# Patient Record
Sex: Male | Born: 1955 | Race: White | Hispanic: No | Marital: Married | Smoking: Former smoker
Health system: Southern US, Community
[De-identification: ages and names within clinical notes are randomized; demographics above are authoritative.]

## PROBLEM LIST (undated history)

## (undated) DIAGNOSIS — J449 Chronic obstructive pulmonary disease, unspecified: Secondary | ICD-10-CM

## (undated) DIAGNOSIS — K7210 Chronic hepatic failure without coma: Secondary | ICD-10-CM

## (undated) DIAGNOSIS — F329 Major depressive disorder, single episode, unspecified: Secondary | ICD-10-CM

## (undated) DIAGNOSIS — I5032 Chronic diastolic (congestive) heart failure: Secondary | ICD-10-CM

## (undated) DIAGNOSIS — F1910 Other psychoactive substance abuse, uncomplicated: Secondary | ICD-10-CM

## (undated) DIAGNOSIS — E785 Hyperlipidemia, unspecified: Secondary | ICD-10-CM

## (undated) DIAGNOSIS — J189 Pneumonia, unspecified organism: Secondary | ICD-10-CM

## (undated) DIAGNOSIS — E119 Type 2 diabetes mellitus without complications: Secondary | ICD-10-CM

## (undated) DIAGNOSIS — D638 Anemia in other chronic diseases classified elsewhere: Secondary | ICD-10-CM

## (undated) DIAGNOSIS — F32A Depression, unspecified: Secondary | ICD-10-CM

## (undated) DIAGNOSIS — G4730 Sleep apnea, unspecified: Secondary | ICD-10-CM

## (undated) DIAGNOSIS — T7840XA Allergy, unspecified, initial encounter: Secondary | ICD-10-CM

## (undated) DIAGNOSIS — I10 Essential (primary) hypertension: Secondary | ICD-10-CM

## (undated) DIAGNOSIS — M6282 Rhabdomyolysis: Secondary | ICD-10-CM

## (undated) DIAGNOSIS — N184 Chronic kidney disease, stage 4 (severe): Secondary | ICD-10-CM

## (undated) HISTORY — DX: Major depressive disorder, single episode, unspecified: F32.9

## (undated) HISTORY — DX: Morbid (severe) obesity due to excess calories: E66.01

## (undated) HISTORY — PX: TONSILLECTOMY: SHX5217

## (undated) HISTORY — PX: FOOT SURGERY: SHX648

## (undated) HISTORY — PX: CARDIAC CATHETERIZATION: SHX172

## (undated) HISTORY — DX: Allergy, unspecified, initial encounter: T78.40XA

## (undated) HISTORY — DX: Hyperlipidemia, unspecified: E78.5

## (undated) HISTORY — DX: Chronic obstructive pulmonary disease, unspecified: J44.9

## (undated) HISTORY — PX: TOE AMPUTATION: SHX809

## (undated) HISTORY — DX: Depression, unspecified: F32.A

## (undated) HISTORY — DX: Sleep apnea, unspecified: G47.30

## (undated) HISTORY — DX: Rhabdomyolysis: M62.82

## (undated) HISTORY — DX: Essential (primary) hypertension: I10

---

## 1987-11-09 HISTORY — PX: ABDOMINAL HERNIA REPAIR: SHX539

## 2005-03-17 ENCOUNTER — Ambulatory Visit: Payer: Self-pay | Admitting: Internal Medicine

## 2008-08-10 ENCOUNTER — Emergency Department: Payer: Self-pay | Admitting: Emergency Medicine

## 2011-04-26 ENCOUNTER — Inpatient Hospital Stay: Payer: Self-pay | Admitting: Internal Medicine

## 2011-08-13 ENCOUNTER — Emergency Department: Payer: Self-pay | Admitting: Internal Medicine

## 2011-09-06 ENCOUNTER — Emergency Department: Payer: Self-pay | Admitting: Emergency Medicine

## 2012-01-24 ENCOUNTER — Other Ambulatory Visit: Payer: Self-pay | Admitting: Internal Medicine

## 2012-01-24 NOTE — Telephone Encounter (Signed)
Opened in error

## 2012-07-04 ENCOUNTER — Ambulatory Visit: Payer: Self-pay | Admitting: Family

## 2012-07-12 ENCOUNTER — Ambulatory Visit: Payer: Self-pay | Admitting: Family

## 2012-09-19 ENCOUNTER — Ambulatory Visit: Payer: Self-pay | Admitting: Family

## 2012-10-10 ENCOUNTER — Ambulatory Visit: Payer: Self-pay | Admitting: Family

## 2012-10-25 ENCOUNTER — Ambulatory Visit: Payer: Self-pay | Admitting: Family

## 2012-11-08 ENCOUNTER — Ambulatory Visit: Payer: Self-pay | Admitting: Family

## 2012-12-09 ENCOUNTER — Ambulatory Visit: Payer: Self-pay | Admitting: Family

## 2013-03-04 LAB — COMPREHENSIVE METABOLIC PANEL
Bilirubin,Total: 0.6 mg/dL (ref 0.2–1.0)
Co2: 30 mmol/L (ref 21–32)
EGFR (African American): 29 — ABNORMAL LOW
EGFR (Non-African Amer.): 25 — ABNORMAL LOW
Glucose: 279 mg/dL — ABNORMAL HIGH (ref 65–99)
Potassium: 4.2 mmol/L (ref 3.5–5.1)
SGOT(AST): 19 U/L (ref 15–37)
SGPT (ALT): 20 U/L (ref 12–78)
Sodium: 134 mmol/L — ABNORMAL LOW (ref 136–145)
Total Protein: 7.3 g/dL (ref 6.4–8.2)

## 2013-03-04 LAB — CBC WITH DIFFERENTIAL/PLATELET
Basophil #: 0.1 10*3/uL (ref 0.0–0.1)
Eosinophil #: 0.2 10*3/uL (ref 0.0–0.7)
Eosinophil %: 1.4 %
HCT: 30.4 % — ABNORMAL LOW (ref 40.0–52.0)
Lymphocyte #: 1.5 10*3/uL (ref 1.0–3.6)
Lymphocyte %: 10.9 %
MCH: 29.3 pg (ref 26.0–34.0)
MCHC: 33.7 g/dL (ref 32.0–36.0)
MCV: 87 fL (ref 80–100)
Monocyte #: 1.3 x10 3/mm — ABNORMAL HIGH (ref 0.2–1.0)
Neutrophil #: 11 10*3/uL — ABNORMAL HIGH (ref 1.4–6.5)
Neutrophil %: 77.9 %
Platelet: 294 10*3/uL (ref 150–440)
RDW: 15.6 % — ABNORMAL HIGH (ref 11.5–14.5)

## 2013-03-05 ENCOUNTER — Inpatient Hospital Stay: Payer: Self-pay | Admitting: Internal Medicine

## 2013-03-05 LAB — MAGNESIUM: Magnesium: 1.6 mg/dL — ABNORMAL LOW

## 2013-03-05 LAB — CBC WITH DIFFERENTIAL/PLATELET
Basophil #: 0.1 10*3/uL (ref 0.0–0.1)
Basophil %: 0.4 %
Eosinophil #: 0.1 10*3/uL (ref 0.0–0.7)
Eosinophil %: 1.1 %
HCT: 28.4 % — ABNORMAL LOW (ref 40.0–52.0)
HGB: 9.8 g/dL — ABNORMAL LOW (ref 13.0–18.0)
Lymphocyte %: 10.8 %
MCH: 29.3 pg (ref 26.0–34.0)
Monocyte #: 1.2 x10 3/mm — ABNORMAL HIGH (ref 0.2–1.0)
Monocyte %: 9.3 %
Neutrophil %: 78.4 %
Platelet: 293 10*3/uL (ref 150–440)
RBC: 3.35 10*6/uL — ABNORMAL LOW (ref 4.40–5.90)

## 2013-03-05 LAB — BASIC METABOLIC PANEL
Anion Gap: 6 — ABNORMAL LOW (ref 7–16)
BUN: 33 mg/dL — ABNORMAL HIGH (ref 7–18)
EGFR (African American): 33 — ABNORMAL LOW
EGFR (Non-African Amer.): 28 — ABNORMAL LOW
Glucose: 219 mg/dL — ABNORMAL HIGH (ref 65–99)
Osmolality: 280 (ref 275–301)
Sodium: 133 mmol/L — ABNORMAL LOW (ref 136–145)

## 2013-03-05 LAB — IRON: Iron: 25 ug/dL — ABNORMAL LOW (ref 65–175)

## 2013-03-05 LAB — CREATININE, URINE, RANDOM: Creatinine, Urine Random: 103.9 mg/dL (ref 30.0–125.0)

## 2013-03-05 LAB — HEMOGLOBIN A1C: Hemoglobin A1C: 8.3 % — ABNORMAL HIGH (ref 4.2–6.3)

## 2013-03-05 LAB — PROTEIN, URINE, RANDOM: Protein, Random Urine: 612 mg/dL — ABNORMAL HIGH (ref 0–12)

## 2013-03-05 LAB — FOLATE: Folic Acid: 24.9 ng/mL (ref 3.1–100.0)

## 2013-03-06 LAB — CBC WITH DIFFERENTIAL/PLATELET
HCT: 26.4 % — ABNORMAL LOW (ref 40.0–52.0)
HGB: 9.3 g/dL — ABNORMAL LOW (ref 13.0–18.0)
Lymphocyte %: 18.9 %
MCHC: 35 g/dL (ref 32.0–36.0)
MCV: 85 fL (ref 80–100)
Monocyte %: 11.4 %
Neutrophil #: 6.4 10*3/uL (ref 1.4–6.5)
Neutrophil %: 67.7 %
Platelet: 300 10*3/uL (ref 150–440)
RBC: 3.11 10*6/uL — ABNORMAL LOW (ref 4.40–5.90)
RDW: 15.3 % — ABNORMAL HIGH (ref 11.5–14.5)
WBC: 9.5 10*3/uL (ref 3.8–10.6)

## 2013-03-06 LAB — BASIC METABOLIC PANEL
Anion Gap: 5 — ABNORMAL LOW (ref 7–16)
BUN: 26 mg/dL — ABNORMAL HIGH (ref 7–18)
Co2: 30 mmol/L (ref 21–32)
Creatinine: 2.38 mg/dL — ABNORMAL HIGH (ref 0.60–1.30)
EGFR (African American): 34 — ABNORMAL LOW
Glucose: 104 mg/dL — ABNORMAL HIGH (ref 65–99)
Osmolality: 277 (ref 275–301)
Potassium: 3.9 mmol/L (ref 3.5–5.1)
Sodium: 136 mmol/L (ref 136–145)

## 2013-03-07 LAB — CBC WITH DIFFERENTIAL/PLATELET
Basophil #: 0.1 10*3/uL (ref 0.0–0.1)
HGB: 9.4 g/dL — ABNORMAL LOW (ref 13.0–18.0)
Lymphocyte #: 2 10*3/uL (ref 1.0–3.6)
Lymphocyte %: 19 %
MCH: 28.8 pg (ref 26.0–34.0)
MCHC: 34.1 g/dL (ref 32.0–36.0)
MCV: 85 fL (ref 80–100)
Monocyte #: 1.3 x10 3/mm — ABNORMAL HIGH (ref 0.2–1.0)
Monocyte %: 12.7 %
Neutrophil #: 6.9 10*3/uL — ABNORMAL HIGH (ref 1.4–6.5)
Neutrophil %: 65.9 %
RBC: 3.26 10*6/uL — ABNORMAL LOW (ref 4.40–5.90)

## 2013-03-07 LAB — BASIC METABOLIC PANEL
Anion Gap: 6 — ABNORMAL LOW (ref 7–16)
BUN: 31 mg/dL — ABNORMAL HIGH (ref 7–18)
Calcium, Total: 9 mg/dL (ref 8.5–10.1)
Chloride: 101 mmol/L (ref 98–107)
EGFR (Non-African Amer.): 29 — ABNORMAL LOW
Glucose: 173 mg/dL — ABNORMAL HIGH (ref 65–99)
Potassium: 4 mmol/L (ref 3.5–5.1)

## 2013-03-08 LAB — BASIC METABOLIC PANEL
BUN: 31 mg/dL — ABNORMAL HIGH (ref 7–18)
Chloride: 104 mmol/L (ref 98–107)
Co2: 30 mmol/L (ref 21–32)
Creatinine: 2.15 mg/dL — ABNORMAL HIGH (ref 0.60–1.30)
EGFR (Non-African Amer.): 33 — ABNORMAL LOW
Osmolality: 286 (ref 275–301)
Potassium: 3.9 mmol/L (ref 3.5–5.1)
Sodium: 138 mmol/L (ref 136–145)

## 2013-03-08 LAB — URINALYSIS, COMPLETE
Bacteria: NONE SEEN
Glucose,UR: >=500 mg/dL (ref 0–75)
Leukocyte Esterase: NEGATIVE
Nitrite: NEGATIVE
Ph: 7 (ref 4.5–8.0)
RBC,UR: 2 /HPF (ref 0–5)
Squamous Epithelial: NONE SEEN
WBC UR: <1 /HPF (ref 0–5)

## 2013-03-08 LAB — VANCOMYCIN, TROUGH: Vancomycin, Trough: 13 ug/mL (ref 10–20)

## 2013-03-09 LAB — BASIC METABOLIC PANEL
Anion Gap: 4 — ABNORMAL LOW (ref 7–16)
Calcium, Total: 9.4 mg/dL (ref 8.5–10.1)
Chloride: 103 mmol/L (ref 98–107)
Co2: 30 mmol/L (ref 21–32)
Creatinine: 2.15 mg/dL — ABNORMAL HIGH (ref 0.60–1.30)
EGFR (Non-African Amer.): 33 — ABNORMAL LOW
Glucose: 167 mg/dL — ABNORMAL HIGH (ref 65–99)
Osmolality: 284 (ref 275–301)
Potassium: 3.9 mmol/L (ref 3.5–5.1)

## 2013-03-09 LAB — PROTEIN ELECTROPHORESIS(ARMC)

## 2013-03-10 LAB — CULTURE, BLOOD (SINGLE)

## 2013-03-10 LAB — WOUND CULTURE

## 2013-03-13 LAB — PATHOLOGY REPORT

## 2013-04-13 ENCOUNTER — Ambulatory Visit: Payer: Self-pay | Admitting: Podiatry

## 2013-05-01 ENCOUNTER — Other Ambulatory Visit: Payer: Self-pay | Admitting: Podiatry

## 2013-05-30 ENCOUNTER — Ambulatory Visit: Payer: Self-pay | Admitting: Gastroenterology

## 2013-07-03 ENCOUNTER — Ambulatory Visit: Payer: Self-pay | Admitting: Podiatry

## 2013-07-03 DIAGNOSIS — E119 Type 2 diabetes mellitus without complications: Secondary | ICD-10-CM

## 2013-07-03 LAB — BASIC METABOLIC PANEL
Anion Gap: 2 — ABNORMAL LOW (ref 7–16)
BUN: 27 mg/dL — ABNORMAL HIGH (ref 7–18)
Calcium, Total: 10.3 mg/dL — ABNORMAL HIGH (ref 8.5–10.1)
Co2: 33 mmol/L — ABNORMAL HIGH (ref 21–32)
Creatinine: 2.25 mg/dL — ABNORMAL HIGH (ref 0.60–1.30)
EGFR (Non-African Amer.): 31 — ABNORMAL LOW
Glucose: 229 mg/dL — ABNORMAL HIGH (ref 65–99)
Potassium: 3.8 mmol/L (ref 3.5–5.1)

## 2013-07-03 LAB — HEMOGLOBIN: HGB: 12.7 g/dL — ABNORMAL LOW (ref 13.0–18.0)

## 2013-07-06 ENCOUNTER — Ambulatory Visit: Payer: Self-pay | Admitting: Podiatry

## 2013-07-10 LAB — PATHOLOGY REPORT

## 2013-09-25 ENCOUNTER — Ambulatory Visit: Payer: Self-pay | Admitting: Neurosurgery

## 2013-10-09 ENCOUNTER — Ambulatory Visit: Payer: Self-pay | Admitting: Family

## 2013-11-23 ENCOUNTER — Inpatient Hospital Stay: Payer: Self-pay | Admitting: Internal Medicine

## 2013-11-23 LAB — COMPREHENSIVE METABOLIC PANEL
ALT: 194 U/L — AB (ref 12–78)
ANION GAP: 11 (ref 7–16)
Albumin: 2.6 g/dL — ABNORMAL LOW (ref 3.4–5.0)
Alkaline Phosphatase: 102 U/L
BILIRUBIN TOTAL: 0.4 mg/dL (ref 0.2–1.0)
BUN: 101 mg/dL — AB (ref 7–18)
CALCIUM: 8.7 mg/dL (ref 8.5–10.1)
CREATININE: 6.08 mg/dL — AB (ref 0.60–1.30)
Chloride: 97 mmol/L — ABNORMAL LOW (ref 98–107)
Co2: 22 mmol/L (ref 21–32)
EGFR (African American): 11 — ABNORMAL LOW
EGFR (Non-African Amer.): 9 — ABNORMAL LOW
Glucose: 197 mg/dL — ABNORMAL HIGH (ref 65–99)
Osmolality: 298 (ref 275–301)
POTASSIUM: 5.1 mmol/L (ref 3.5–5.1)
SGOT(AST): 1076 U/L — ABNORMAL HIGH (ref 15–37)
Sodium: 130 mmol/L — ABNORMAL LOW (ref 136–145)
TOTAL PROTEIN: 8.2 g/dL (ref 6.4–8.2)

## 2013-11-23 LAB — CK-MB: CK-MB: 27.1 ng/mL — AB (ref 0.5–3.6)

## 2013-11-23 LAB — CBC
HCT: 35.6 % — ABNORMAL LOW (ref 40.0–52.0)
HGB: 12.3 g/dL — AB (ref 13.0–18.0)
MCH: 29.5 pg (ref 26.0–34.0)
MCHC: 34.5 g/dL (ref 32.0–36.0)
MCV: 86 fL (ref 80–100)
PLATELETS: 341 10*3/uL (ref 150–440)
RBC: 4.15 10*6/uL — ABNORMAL LOW (ref 4.40–5.90)
RDW: 15.6 % — AB (ref 11.5–14.5)
WBC: 10.7 10*3/uL — ABNORMAL HIGH (ref 3.8–10.6)

## 2013-11-23 LAB — SEDIMENTATION RATE: Erythrocyte Sed Rate: >140 mm/hr — ABNORMAL HIGH (ref 0–20)

## 2013-11-23 LAB — CK TOTAL AND CKMB (NOT AT ARMC)
CK, Total: 62971 U/L — ABNORMAL HIGH (ref 35–232)
CK-MB: 28.3 ng/mL — ABNORMAL HIGH (ref 0.5–3.6)

## 2013-11-23 LAB — TROPONIN I: Troponin-I: 0.42 ng/mL — ABNORMAL HIGH

## 2013-11-23 LAB — CK: CK, Total: 61785 U/L — ABNORMAL HIGH (ref 35–232)

## 2013-11-23 LAB — TSH: Thyroid Stimulating Horm: 2.62 u[IU]/mL

## 2013-11-24 LAB — BASIC METABOLIC PANEL
Anion Gap: 10 (ref 7–16)
Anion Gap: 10 (ref 7–16)
Anion Gap: 11 (ref 7–16)
BUN: 104 mg/dL — AB (ref 7–18)
BUN: 105 mg/dL — ABNORMAL HIGH (ref 7–18)
BUN: 105 mg/dL — AB (ref 7–18)
CALCIUM: 8 mg/dL — AB (ref 8.5–10.1)
CALCIUM: 7.3 mg/dL — AB (ref 8.5–10.1)
CALCIUM: 7.1 mg/dL — AB (ref 8.5–10.1)
CHLORIDE: 100 mmol/L (ref 98–107)
CHLORIDE: 100 mmol/L (ref 98–107)
CO2: 21 mmol/L (ref 21–32)
Chloride: 101 mmol/L (ref 98–107)
Co2: 20 mmol/L — ABNORMAL LOW (ref 21–32)
Co2: 20 mmol/L — ABNORMAL LOW (ref 21–32)
Creatinine: 6.19 mg/dL — ABNORMAL HIGH (ref 0.60–1.30)
Creatinine: 6.63 mg/dL — ABNORMAL HIGH (ref 0.60–1.30)
Creatinine: 6.62 mg/dL — ABNORMAL HIGH (ref 0.60–1.30)
EGFR (African American): 11 — ABNORMAL LOW
EGFR (Non-African Amer.): 9 — ABNORMAL LOW
EGFR (Non-African Amer.): 8 — ABNORMAL LOW
EGFR (Non-African Amer.): 8 — ABNORMAL LOW
GFR CALC AF AMER: 10 — AB
GFR CALC AF AMER: 10 — AB
GLUCOSE: 114 mg/dL — AB (ref 65–99)
GLUCOSE: 117 mg/dL — AB (ref 65–99)
Glucose: 115 mg/dL — ABNORMAL HIGH (ref 65–99)
OSMOLALITY: 296 (ref 275–301)
Osmolality: 295 (ref 275–301)
Osmolality: 298 (ref 275–301)
Potassium: 4.4 mmol/L (ref 3.5–5.1)
Potassium: 4.5 mmol/L (ref 3.5–5.1)
Potassium: 4.6 mmol/L (ref 3.5–5.1)
SODIUM: 131 mmol/L — AB (ref 136–145)
Sodium: 130 mmol/L — ABNORMAL LOW (ref 136–145)
Sodium: 132 mmol/L — ABNORMAL LOW (ref 136–145)

## 2013-11-24 LAB — URINALYSIS, COMPLETE
BACTERIA: NONE SEEN
BILIRUBIN, UR: NEGATIVE
Ketone: NEGATIVE
Leukocyte Esterase: NEGATIVE
NITRITE: NEGATIVE
PH: 5 (ref 4.5–8.0)
Specific Gravity: 1.014 (ref 1.003–1.030)
Squamous Epithelial: NONE SEEN
WBC UR: 5 /HPF (ref 0–5)

## 2013-11-24 LAB — CBC WITH DIFFERENTIAL/PLATELET
BASOS PCT: 1.4 %
Basophil #: 0.1 10*3/uL (ref 0.0–0.1)
EOS PCT: 1.8 %
Eosinophil #: 0.2 10*3/uL (ref 0.0–0.7)
HCT: 32.1 % — AB (ref 40.0–52.0)
HGB: 11.1 g/dL — ABNORMAL LOW (ref 13.0–18.0)
Lymphocyte #: 1.9 10*3/uL (ref 1.0–3.6)
Lymphocyte %: 21.1 %
MCH: 29.4 pg (ref 26.0–34.0)
MCHC: 34.6 g/dL (ref 32.0–36.0)
MCV: 85 fL (ref 80–100)
MONOS PCT: 8.6 %
Monocyte #: 0.8 x10 3/mm (ref 0.2–1.0)
NEUTROS ABS: 5.9 10*3/uL (ref 1.4–6.5)
NEUTROS PCT: 67.1 %
Platelet: 272 10*3/uL (ref 150–440)
RBC: 3.76 10*6/uL — ABNORMAL LOW (ref 4.40–5.90)
RDW: 15.9 % — AB (ref 11.5–14.5)
WBC: 8.8 10*3/uL (ref 3.8–10.6)

## 2013-11-24 LAB — CK: CK, Total: 28420 U/L — ABNORMAL HIGH (ref 35–232)

## 2013-11-24 LAB — CK-MB
CK-MB: 26.2 ng/mL — ABNORMAL HIGH (ref 0.5–3.6)
CK-MB: 21.9 ng/mL — ABNORMAL HIGH (ref 0.5–3.6)
CK-MB: 18.9 ng/mL — AB (ref 0.5–3.6)
CK-MB: 16.5 ng/mL — ABNORMAL HIGH (ref 0.5–3.6)
CK-MB: 13.1 ng/mL — AB (ref 0.5–3.6)

## 2013-11-24 LAB — DRUG SCREEN, URINE
Amphetamines, Ur Screen: NEGATIVE (ref ?–1000)
BENZODIAZEPINE, UR SCRN: NEGATIVE (ref ?–200)
Barbiturates, Ur Screen: NEGATIVE (ref ?–200)
Cannabinoid 50 Ng, Ur ~~LOC~~: NEGATIVE (ref ?–50)
Cocaine Metabolite,Ur ~~LOC~~: NEGATIVE (ref ?–300)
MDMA (Ecstasy)Ur Screen: NEGATIVE (ref ?–500)
Methadone, Ur Screen: NEGATIVE (ref ?–300)
OPIATE, UR SCREEN: POSITIVE (ref ?–300)
Phencyclidine (PCP) Ur S: NEGATIVE (ref ?–25)
TRICYCLIC, UR SCREEN: POSITIVE (ref ?–1000)

## 2013-11-25 LAB — CK: CK, Total: 19885 U/L — ABNORMAL HIGH (ref 35–232)

## 2013-11-25 LAB — HEMOGLOBIN: HGB: 10.8 g/dL — AB (ref 13.0–18.0)

## 2013-11-25 LAB — BASIC METABOLIC PANEL
Anion Gap: 10 (ref 7–16)
BUN: 114 mg/dL — AB (ref 7–18)
CALCIUM: 7.4 mg/dL — AB (ref 8.5–10.1)
Chloride: 102 mmol/L (ref 98–107)
Co2: 18 mmol/L — ABNORMAL LOW (ref 21–32)
Creatinine: 6.96 mg/dL — ABNORMAL HIGH (ref 0.60–1.30)
EGFR (Non-African Amer.): 8 — ABNORMAL LOW
GFR CALC AF AMER: 9 — AB
Glucose: 99 mg/dL (ref 65–99)
Osmolality: 297 (ref 275–301)
Potassium: 4.4 mmol/L (ref 3.5–5.1)
Sodium: 130 mmol/L — ABNORMAL LOW (ref 136–145)

## 2013-11-26 LAB — BASIC METABOLIC PANEL
ANION GAP: 11 (ref 7–16)
BUN: 113 mg/dL — ABNORMAL HIGH (ref 7–18)
CALCIUM: 7.3 mg/dL — AB (ref 8.5–10.1)
CHLORIDE: 99 mmol/L (ref 98–107)
CO2: 19 mmol/L — AB (ref 21–32)
Creatinine: 7.57 mg/dL — ABNORMAL HIGH (ref 0.60–1.30)
EGFR (Non-African Amer.): 7 — ABNORMAL LOW
GFR CALC AF AMER: 8 — AB
GLUCOSE: 139 mg/dL — AB (ref 65–99)
Osmolality: 297 (ref 275–301)
Potassium: 4 mmol/L (ref 3.5–5.1)
SODIUM: 129 mmol/L — AB (ref 136–145)

## 2013-11-26 LAB — CK: CK, TOTAL: 10229 U/L — AB (ref 35–232)

## 2013-11-27 LAB — CK: CK, Total: 5964 U/L — ABNORMAL HIGH (ref 35–232)

## 2013-11-27 LAB — BASIC METABOLIC PANEL
Anion Gap: 13 (ref 7–16)
BUN: 113 mg/dL — AB (ref 7–18)
CO2: 22 mmol/L (ref 21–32)
CREATININE: 7.73 mg/dL — AB (ref 0.60–1.30)
Calcium, Total: 7.5 mg/dL — ABNORMAL LOW (ref 8.5–10.1)
Chloride: 95 mmol/L — ABNORMAL LOW (ref 98–107)
EGFR (African American): 8 — ABNORMAL LOW
EGFR (Non-African Amer.): 7 — ABNORMAL LOW
Glucose: 185 mg/dL — ABNORMAL HIGH (ref 65–99)
OSMOLALITY: 301 (ref 275–301)
Potassium: 3.9 mmol/L (ref 3.5–5.1)
Sodium: 130 mmol/L — ABNORMAL LOW (ref 136–145)

## 2013-11-27 LAB — PLATELET COUNT: PLATELETS: 274 10*3/uL (ref 150–440)

## 2013-11-27 LAB — HEMOGLOBIN: HGB: 10.6 g/dL — ABNORMAL LOW (ref 13.0–18.0)

## 2013-11-28 LAB — COMPREHENSIVE METABOLIC PANEL
ANION GAP: 11 (ref 7–16)
Albumin: 2.1 g/dL — ABNORMAL LOW (ref 3.4–5.0)
Alkaline Phosphatase: 80 U/L
BUN: 106 mg/dL — AB (ref 7–18)
Bilirubin,Total: 0.3 mg/dL (ref 0.2–1.0)
CALCIUM: 7.7 mg/dL — AB (ref 8.5–10.1)
CO2: 25 mmol/L (ref 21–32)
Chloride: 94 mmol/L — ABNORMAL LOW (ref 98–107)
Creatinine: 7.27 mg/dL — ABNORMAL HIGH (ref 0.60–1.30)
EGFR (Non-African Amer.): 8 — ABNORMAL LOW
GFR CALC AF AMER: 9 — AB
Glucose: 171 mg/dL — ABNORMAL HIGH (ref 65–99)
Osmolality: 298 (ref 275–301)
Potassium: 3.6 mmol/L (ref 3.5–5.1)
SGOT(AST): 79 U/L — ABNORMAL HIGH (ref 15–37)
SGPT (ALT): 72 U/L (ref 12–78)
Sodium: 130 mmol/L — ABNORMAL LOW (ref 136–145)
Total Protein: 6.5 g/dL (ref 6.4–8.2)

## 2013-11-28 LAB — CK: CK, Total: 3164 U/L — ABNORMAL HIGH (ref 35–232)

## 2013-11-29 LAB — CBC WITH DIFFERENTIAL/PLATELET
Basophil #: 0.1 10*3/uL (ref 0.0–0.1)
Basophil %: 0.7 %
EOS ABS: 0.3 10*3/uL (ref 0.0–0.7)
Eosinophil %: 3.5 %
HCT: 27.3 % — AB (ref 40.0–52.0)
HGB: 9.5 g/dL — AB (ref 13.0–18.0)
Lymphocyte #: 1.6 10*3/uL (ref 1.0–3.6)
Lymphocyte %: 21.5 %
MCH: 29.9 pg (ref 26.0–34.0)
MCHC: 34.7 g/dL (ref 32.0–36.0)
MCV: 86 fL (ref 80–100)
MONOS PCT: 11.5 %
Monocyte #: 0.9 x10 3/mm (ref 0.2–1.0)
Neutrophil #: 4.7 10*3/uL (ref 1.4–6.5)
Neutrophil %: 62.8 %
Platelet: 270 10*3/uL (ref 150–440)
RBC: 3.17 10*6/uL — ABNORMAL LOW (ref 4.40–5.90)
RDW: 16.1 % — ABNORMAL HIGH (ref 11.5–14.5)
WBC: 7.6 10*3/uL (ref 3.8–10.6)

## 2013-11-29 LAB — PROTEIN / CREATININE RATIO, URINE
Creatinine, Urine: 32.6 mg/dL (ref 30.0–125.0)
PROTEIN/CREAT. RATIO: 5613 mg/g{creat} — AB (ref 0–200)
Protein, Random Urine: 183 mg/dL — ABNORMAL HIGH (ref 0–12)

## 2013-11-29 LAB — BASIC METABOLIC PANEL
Anion Gap: 10 (ref 7–16)
BUN: 96 mg/dL — ABNORMAL HIGH (ref 7–18)
CO2: 28 mmol/L (ref 21–32)
CREATININE: 7.2 mg/dL — AB (ref 0.60–1.30)
Calcium, Total: 7.9 mg/dL — ABNORMAL LOW (ref 8.5–10.1)
Chloride: 94 mmol/L — ABNORMAL LOW (ref 98–107)
EGFR (African American): 9 — ABNORMAL LOW
EGFR (Non-African Amer.): 8 — ABNORMAL LOW
Glucose: 174 mg/dL — ABNORMAL HIGH (ref 65–99)
OSMOLALITY: 298 (ref 275–301)
Potassium: 3.3 mmol/L — ABNORMAL LOW (ref 3.5–5.1)
Sodium: 132 mmol/L — ABNORMAL LOW (ref 136–145)

## 2013-11-29 LAB — CK: CK, Total: 2473 U/L — ABNORMAL HIGH (ref 35–232)

## 2013-11-30 LAB — BASIC METABOLIC PANEL
Anion Gap: 7 (ref 7–16)
BUN: 86 mg/dL — ABNORMAL HIGH (ref 7–18)
CHLORIDE: 92 mmol/L — AB (ref 98–107)
Calcium, Total: 8.1 mg/dL — ABNORMAL LOW (ref 8.5–10.1)
Co2: 33 mmol/L — ABNORMAL HIGH (ref 21–32)
Creatinine: 6.6 mg/dL — ABNORMAL HIGH (ref 0.60–1.30)
EGFR (African American): 10 — ABNORMAL LOW
EGFR (Non-African Amer.): 8 — ABNORMAL LOW
GLUCOSE: 205 mg/dL — AB (ref 65–99)
Osmolality: 297 (ref 275–301)
Potassium: 3.4 mmol/L — ABNORMAL LOW (ref 3.5–5.1)
Sodium: 132 mmol/L — ABNORMAL LOW (ref 136–145)

## 2013-11-30 LAB — PROTEIN ELECTROPHORESIS(ARMC)

## 2013-11-30 LAB — UR PROT ELECTROPHORESIS, URINE RANDOM

## 2013-12-01 LAB — BASIC METABOLIC PANEL
ANION GAP: 6 — AB (ref 7–16)
BUN: 74 mg/dL — AB (ref 7–18)
CO2: 37 mmol/L — AB (ref 21–32)
Calcium, Total: 8.6 mg/dL (ref 8.5–10.1)
Chloride: 93 mmol/L — ABNORMAL LOW (ref 98–107)
Creatinine: 6.28 mg/dL — ABNORMAL HIGH (ref 0.60–1.30)
EGFR (African American): 10 — ABNORMAL LOW
EGFR (Non-African Amer.): 9 — ABNORMAL LOW
GLUCOSE: 113 mg/dL — AB (ref 65–99)
Osmolality: 295 (ref 275–301)
Potassium: 3.3 mmol/L — ABNORMAL LOW (ref 3.5–5.1)
SODIUM: 136 mmol/L (ref 136–145)

## 2013-12-02 LAB — BASIC METABOLIC PANEL
Anion Gap: 3 — ABNORMAL LOW (ref 7–16)
BUN: 66 mg/dL — ABNORMAL HIGH (ref 7–18)
CHLORIDE: 87 mmol/L — AB (ref 98–107)
CO2: 42 mmol/L — AB (ref 21–32)
Calcium, Total: 8.9 mg/dL (ref 8.5–10.1)
Creatinine: 5.85 mg/dL — ABNORMAL HIGH (ref 0.60–1.30)
EGFR (African American): 11 — ABNORMAL LOW
EGFR (Non-African Amer.): 10 — ABNORMAL LOW
GLUCOSE: 241 mg/dL — AB (ref 65–99)
Osmolality: 291 (ref 275–301)
POTASSIUM: 3.3 mmol/L — AB (ref 3.5–5.1)
SODIUM: 132 mmol/L — AB (ref 136–145)

## 2013-12-03 LAB — BASIC METABOLIC PANEL
ANION GAP: 7 (ref 7–16)
BUN: 64 mg/dL — ABNORMAL HIGH (ref 7–18)
CHLORIDE: 92 mmol/L — AB (ref 98–107)
CREATININE: 5.4 mg/dL — AB (ref 0.60–1.30)
Calcium, Total: 9.5 mg/dL (ref 8.5–10.1)
Co2: 37 mmol/L — ABNORMAL HIGH (ref 21–32)
GFR CALC AF AMER: 13 — AB
GFR CALC NON AF AMER: 11 — AB
GLUCOSE: 136 mg/dL — AB (ref 65–99)
Osmolality: 292 (ref 275–301)
Potassium: 3.4 mmol/L — ABNORMAL LOW (ref 3.5–5.1)
SODIUM: 136 mmol/L (ref 136–145)

## 2013-12-03 LAB — PLATELET COUNT: Platelet: 372 10*3/uL (ref 150–440)

## 2013-12-04 ENCOUNTER — Inpatient Hospital Stay: Payer: Self-pay | Admitting: Internal Medicine

## 2013-12-04 LAB — BASIC METABOLIC PANEL
ANION GAP: 6 — AB (ref 7–16)
BUN: 59 mg/dL — ABNORMAL HIGH (ref 7–18)
CALCIUM: 9.1 mg/dL (ref 8.5–10.1)
CHLORIDE: 92 mmol/L — AB (ref 98–107)
Co2: 35 mmol/L — ABNORMAL HIGH (ref 21–32)
Creatinine: 5.22 mg/dL — ABNORMAL HIGH (ref 0.60–1.30)
EGFR (Non-African Amer.): 11 — ABNORMAL LOW
GFR CALC AF AMER: 13 — AB
Glucose: 167 mg/dL — ABNORMAL HIGH (ref 65–99)
Osmolality: 287 (ref 275–301)
Potassium: 3.4 mmol/L — ABNORMAL LOW (ref 3.5–5.1)
Sodium: 133 mmol/L — ABNORMAL LOW (ref 136–145)

## 2013-12-04 LAB — URINALYSIS, COMPLETE
BACTERIA: NONE SEEN
Bilirubin,UR: NEGATIVE
Glucose,UR: >=500 mg/dL (ref 0–75)
KETONE: NEGATIVE
NITRITE: NEGATIVE
PH: 7 (ref 4.5–8.0)
RBC,UR: 15 /HPF (ref 0–5)
Specific Gravity: 1.007 (ref 1.003–1.030)
Squamous Epithelial: <1
WBC UR: 14 /HPF (ref 0–5)

## 2013-12-04 LAB — CBC
HCT: 26.7 % — AB (ref 40.0–52.0)
HGB: 9.2 g/dL — ABNORMAL LOW (ref 13.0–18.0)
MCH: 30.3 pg (ref 26.0–34.0)
MCHC: 34.5 g/dL (ref 32.0–36.0)
MCV: 88 fL (ref 80–100)
Platelet: 352 10*3/uL (ref 150–440)
RBC: 3.05 10*6/uL — ABNORMAL LOW (ref 4.40–5.90)
RDW: 15.3 % — ABNORMAL HIGH (ref 11.5–14.5)
WBC: 8.6 10*3/uL (ref 3.8–10.6)

## 2013-12-04 LAB — MAGNESIUM: MAGNESIUM: 1.6 mg/dL — AB

## 2013-12-04 LAB — CK TOTAL AND CKMB (NOT AT ARMC)
CK, Total: 336 U/L — ABNORMAL HIGH (ref 35–232)
CK-MB: 10.1 ng/mL — AB (ref 0.5–3.6)

## 2013-12-04 LAB — ALBUMIN: Albumin: 2.3 g/dL — ABNORMAL LOW (ref 3.4–5.0)

## 2013-12-04 LAB — PRO B NATRIURETIC PEPTIDE: B-Type Natriuretic Peptide: 1993 pg/mL — ABNORMAL HIGH (ref 0–125)

## 2013-12-04 LAB — TROPONIN I: Troponin-I: <0.02 ng/mL

## 2013-12-05 DIAGNOSIS — I4891 Unspecified atrial fibrillation: Secondary | ICD-10-CM

## 2013-12-05 LAB — BASIC METABOLIC PANEL
Anion Gap: 5 — ABNORMAL LOW (ref 7–16)
BUN: 58 mg/dL — AB (ref 7–18)
CALCIUM: 8.7 mg/dL (ref 8.5–10.1)
Chloride: 95 mmol/L — ABNORMAL LOW (ref 98–107)
Co2: 35 mmol/L — ABNORMAL HIGH (ref 21–32)
Creatinine: 5.14 mg/dL — ABNORMAL HIGH (ref 0.60–1.30)
EGFR (Non-African Amer.): 11 — ABNORMAL LOW
GFR CALC AF AMER: 13 — AB
Glucose: 179 mg/dL — ABNORMAL HIGH (ref 65–99)
Osmolality: 291 (ref 275–301)
POTASSIUM: 3.2 mmol/L — AB (ref 3.5–5.1)
SODIUM: 135 mmol/L — AB (ref 136–145)

## 2013-12-05 LAB — CBC WITH DIFFERENTIAL/PLATELET
BASOS ABS: 0 10*3/uL (ref 0.0–0.1)
Basophil %: 0.4 %
EOS ABS: 0.2 10*3/uL (ref 0.0–0.7)
EOS PCT: 2.9 %
HCT: 25.4 % — AB (ref 40.0–52.0)
HGB: 8.7 g/dL — ABNORMAL LOW (ref 13.0–18.0)
Lymphocyte #: 1.3 10*3/uL (ref 1.0–3.6)
Lymphocyte %: 14.8 %
MCH: 30.1 pg (ref 26.0–34.0)
MCHC: 34.3 g/dL (ref 32.0–36.0)
MCV: 88 fL (ref 80–100)
MONO ABS: 0.7 x10 3/mm (ref 0.2–1.0)
Monocyte %: 8.7 %
Neutrophil #: 6.2 10*3/uL (ref 1.4–6.5)
Neutrophil %: 73.2 %
Platelet: 331 10*3/uL (ref 150–440)
RBC: 2.9 10*6/uL — ABNORMAL LOW (ref 4.40–5.90)
RDW: 15.5 % — ABNORMAL HIGH (ref 11.5–14.5)
WBC: 8.5 10*3/uL (ref 3.8–10.6)

## 2013-12-05 LAB — TROPONIN I: Troponin-I: <0.02 ng/mL

## 2013-12-05 LAB — MAGNESIUM: MAGNESIUM: 1.7 mg/dL — AB

## 2013-12-06 LAB — BASIC METABOLIC PANEL
ANION GAP: 7 (ref 7–16)
BUN: 55 mg/dL — ABNORMAL HIGH (ref 7–18)
CHLORIDE: 96 mmol/L — AB (ref 98–107)
CREATININE: 4.82 mg/dL — AB (ref 0.60–1.30)
Calcium, Total: 8.8 mg/dL (ref 8.5–10.1)
Co2: 34 mmol/L — ABNORMAL HIGH (ref 21–32)
GFR CALC AF AMER: 14 — AB
GFR CALC NON AF AMER: 12 — AB
Glucose: 128 mg/dL — ABNORMAL HIGH (ref 65–99)
Osmolality: 291 (ref 275–301)
Potassium: 3.5 mmol/L (ref 3.5–5.1)
SODIUM: 137 mmol/L (ref 136–145)

## 2013-12-06 LAB — URINE CULTURE

## 2013-12-06 LAB — TROPONIN I: Troponin-I: <0.02 ng/mL

## 2013-12-06 LAB — ALBUMIN: ALBUMIN: 2.3 g/dL — AB (ref 3.4–5.0)

## 2013-12-07 LAB — BASIC METABOLIC PANEL
ANION GAP: 6 — AB (ref 7–16)
BUN: 52 mg/dL — AB (ref 7–18)
Calcium, Total: 9 mg/dL (ref 8.5–10.1)
Chloride: 95 mmol/L — ABNORMAL LOW (ref 98–107)
Co2: 37 mmol/L — ABNORMAL HIGH (ref 21–32)
Creatinine: 4.48 mg/dL — ABNORMAL HIGH (ref 0.60–1.30)
EGFR (African American): 16 — ABNORMAL LOW
GFR CALC NON AF AMER: 14 — AB
Glucose: 86 mg/dL (ref 65–99)
OSMOLALITY: 289 (ref 275–301)
Potassium: 3.6 mmol/L (ref 3.5–5.1)
Sodium: 138 mmol/L (ref 136–145)

## 2013-12-08 LAB — CBC WITH DIFFERENTIAL/PLATELET
Basophil #: 0 10*3/uL (ref 0.0–0.1)
Basophil %: 0.3 %
EOS ABS: 0.1 10*3/uL (ref 0.0–0.7)
EOS PCT: 1.8 %
HCT: 24.1 % — ABNORMAL LOW (ref 40.0–52.0)
HGB: 8 g/dL — AB (ref 13.0–18.0)
LYMPHS PCT: 11.8 %
Lymphocyte #: 0.8 10*3/uL — ABNORMAL LOW (ref 1.0–3.6)
MCH: 29.1 pg (ref 26.0–34.0)
MCHC: 33.4 g/dL (ref 32.0–36.0)
MCV: 87 fL (ref 80–100)
MONOS PCT: 11.9 %
Monocyte #: 0.8 x10 3/mm (ref 0.2–1.0)
Neutrophil #: 5.2 10*3/uL (ref 1.4–6.5)
Neutrophil %: 74.2 %
Platelet: 349 10*3/uL (ref 150–440)
RBC: 2.76 10*6/uL — ABNORMAL LOW (ref 4.40–5.90)
RDW: 15.4 % — AB (ref 11.5–14.5)
WBC: 7.1 10*3/uL (ref 3.8–10.6)

## 2013-12-08 LAB — BASIC METABOLIC PANEL
ANION GAP: 6 — AB (ref 7–16)
BUN: 49 mg/dL — ABNORMAL HIGH (ref 7–18)
CO2: 34 mmol/L — AB (ref 21–32)
CREATININE: 3.83 mg/dL — AB (ref 0.60–1.30)
Calcium, Total: 9.4 mg/dL (ref 8.5–10.1)
Chloride: 95 mmol/L — ABNORMAL LOW (ref 98–107)
EGFR (African American): 19 — ABNORMAL LOW
GFR CALC NON AF AMER: 16 — AB
GLUCOSE: 129 mg/dL — AB (ref 65–99)
Osmolality: 285 (ref 275–301)
POTASSIUM: 3.2 mmol/L — AB (ref 3.5–5.1)
Sodium: 135 mmol/L — ABNORMAL LOW (ref 136–145)

## 2013-12-09 LAB — RENAL FUNCTION PANEL
ALBUMIN: 2.5 g/dL — AB (ref 3.4–5.0)
ANION GAP: 5 — AB (ref 7–16)
BUN: 44 mg/dL — ABNORMAL HIGH (ref 7–18)
CO2: 33 mmol/L — AB (ref 21–32)
CREATININE: 3.37 mg/dL — AB (ref 0.60–1.30)
Calcium, Total: 9.7 mg/dL (ref 8.5–10.1)
Chloride: 97 mmol/L — ABNORMAL LOW (ref 98–107)
EGFR (African American): 22 — ABNORMAL LOW
EGFR (Non-African Amer.): 19 — ABNORMAL LOW
Glucose: 100 mg/dL — ABNORMAL HIGH (ref 65–99)
Osmolality: 281 (ref 275–301)
PHOSPHORUS: 3.3 mg/dL (ref 2.5–4.9)
POTASSIUM: 3.2 mmol/L — AB (ref 3.5–5.1)
Sodium: 135 mmol/L — ABNORMAL LOW (ref 136–145)

## 2013-12-09 LAB — CULTURE, BLOOD (SINGLE)

## 2013-12-10 LAB — BASIC METABOLIC PANEL
Anion Gap: 0 — ABNORMAL LOW (ref 7–16)
BUN: 40 mg/dL — AB (ref 7–18)
CO2: 35 mmol/L — AB (ref 21–32)
CREATININE: 3.07 mg/dL — AB (ref 0.60–1.30)
Calcium, Total: 9.6 mg/dL (ref 8.5–10.1)
Chloride: 100 mmol/L (ref 98–107)
EGFR (African American): 25 — ABNORMAL LOW
EGFR (Non-African Amer.): 21 — ABNORMAL LOW
GLUCOSE: 150 mg/dL — AB (ref 65–99)
OSMOLALITY: 283 (ref 275–301)
POTASSIUM: 3.5 mmol/L (ref 3.5–5.1)
Sodium: 135 mmol/L — ABNORMAL LOW (ref 136–145)

## 2013-12-10 LAB — MAGNESIUM: MAGNESIUM: 1.5 mg/dL — AB

## 2013-12-11 LAB — BASIC METABOLIC PANEL
Anion Gap: 3 — ABNORMAL LOW (ref 7–16)
BUN: 36 mg/dL — ABNORMAL HIGH (ref 7–18)
CALCIUM: 9.6 mg/dL (ref 8.5–10.1)
CO2: 37 mmol/L — AB (ref 21–32)
Chloride: 99 mmol/L (ref 98–107)
Creatinine: 2.85 mg/dL — ABNORMAL HIGH (ref 0.60–1.30)
EGFR (Non-African Amer.): 23 — ABNORMAL LOW
GFR CALC AF AMER: 27 — AB
Glucose: 164 mg/dL — ABNORMAL HIGH (ref 65–99)
OSMOLALITY: 290 (ref 275–301)
Potassium: 3.5 mmol/L (ref 3.5–5.1)
SODIUM: 139 mmol/L (ref 136–145)

## 2013-12-19 ENCOUNTER — Encounter: Payer: Self-pay | Admitting: *Deleted

## 2013-12-25 ENCOUNTER — Encounter: Payer: Medicare HMO | Admitting: Cardiovascular Disease

## 2014-01-22 ENCOUNTER — Inpatient Hospital Stay: Payer: Self-pay | Admitting: Internal Medicine

## 2014-01-22 LAB — COMPREHENSIVE METABOLIC PANEL
ALK PHOS: 83 U/L
Albumin: 2.7 g/dL — ABNORMAL LOW (ref 3.4–5.0)
Anion Gap: 5 — ABNORMAL LOW (ref 7–16)
BUN: 28 mg/dL — ABNORMAL HIGH (ref 7–18)
Bilirubin,Total: 0.6 mg/dL (ref 0.2–1.0)
CO2: 27 mmol/L (ref 21–32)
CREATININE: 1.62 mg/dL — AB (ref 0.60–1.30)
Calcium, Total: 9.2 mg/dL (ref 8.5–10.1)
Chloride: 105 mmol/L (ref 98–107)
EGFR (African American): 54 — ABNORMAL LOW
EGFR (Non-African Amer.): 46 — ABNORMAL LOW
Glucose: 166 mg/dL — ABNORMAL HIGH (ref 65–99)
Osmolality: 283 (ref 275–301)
POTASSIUM: 3.8 mmol/L (ref 3.5–5.1)
SGOT(AST): 172 U/L — ABNORMAL HIGH (ref 15–37)
SGPT (ALT): 53 U/L (ref 12–78)
SODIUM: 137 mmol/L (ref 136–145)
TOTAL PROTEIN: 7.4 g/dL (ref 6.4–8.2)

## 2014-01-22 LAB — CBC
HCT: 35.1 % — AB (ref 40.0–52.0)
HGB: 11.8 g/dL — ABNORMAL LOW (ref 13.0–18.0)
MCH: 27 pg (ref 26.0–34.0)
MCHC: 33.6 g/dL (ref 32.0–36.0)
MCV: 80 fL (ref 80–100)
Platelet: 409 10*3/uL (ref 150–440)
RBC: 4.37 10*6/uL — ABNORMAL LOW (ref 4.40–5.90)
RDW: 16.2 % — AB (ref 11.5–14.5)
WBC: 10.9 10*3/uL — AB (ref 3.8–10.6)

## 2014-01-22 LAB — CK TOTAL AND CKMB (NOT AT ARMC)
CK, TOTAL: 3576 U/L — AB
CK-MB: 26.6 ng/mL — ABNORMAL HIGH (ref 0.5–3.6)

## 2014-01-22 LAB — TROPONIN I: Troponin-I: 0.2 ng/mL — ABNORMAL HIGH

## 2014-01-23 LAB — BASIC METABOLIC PANEL
ANION GAP: 4 — AB (ref 7–16)
BUN: 28 mg/dL — ABNORMAL HIGH (ref 7–18)
CHLORIDE: 106 mmol/L (ref 98–107)
CO2: 28 mmol/L (ref 21–32)
Calcium, Total: 8.8 mg/dL (ref 8.5–10.1)
Creatinine: 1.59 mg/dL — ABNORMAL HIGH (ref 0.60–1.30)
EGFR (African American): 55 — ABNORMAL LOW
EGFR (Non-African Amer.): 47 — ABNORMAL LOW
Glucose: 117 mg/dL — ABNORMAL HIGH (ref 65–99)
Osmolality: 282 (ref 275–301)
POTASSIUM: 3.7 mmol/L (ref 3.5–5.1)
SODIUM: 138 mmol/L (ref 136–145)

## 2014-01-23 LAB — URINALYSIS, COMPLETE
Bilirubin,UR: NEGATIVE
Glucose,UR: 150 mg/dL (ref 0–75)
Hyaline Cast: 2
Ketone: NEGATIVE
LEUKOCYTE ESTERASE: NEGATIVE
Nitrite: NEGATIVE
PH: 5 (ref 4.5–8.0)
RBC,UR: 5 /HPF (ref 0–5)
Specific Gravity: 1.014 (ref 1.003–1.030)
Squamous Epithelial: 1
WBC UR: 2 /HPF (ref 0–5)

## 2014-01-23 LAB — CBC WITH DIFFERENTIAL/PLATELET
Basophil #: 0.1 10*3/uL (ref 0.0–0.1)
Basophil %: 0.6 %
Eosinophil #: 0.2 10*3/uL (ref 0.0–0.7)
Eosinophil %: 1.8 %
HCT: 33.4 % — AB (ref 40.0–52.0)
HGB: 11.2 g/dL — ABNORMAL LOW (ref 13.0–18.0)
LYMPHS ABS: 3 10*3/uL (ref 1.0–3.6)
LYMPHS PCT: 29.8 %
MCH: 27.3 pg (ref 26.0–34.0)
MCHC: 33.4 g/dL (ref 32.0–36.0)
MCV: 82 fL (ref 80–100)
MONO ABS: 0.7 x10 3/mm (ref 0.2–1.0)
Monocyte %: 7.3 %
NEUTROS ABS: 6.2 10*3/uL (ref 1.4–6.5)
Neutrophil %: 60.5 %
Platelet: 385 10*3/uL (ref 150–440)
RBC: 4.08 10*6/uL — AB (ref 4.40–5.90)
RDW: 16.3 % — ABNORMAL HIGH (ref 11.5–14.5)
WBC: 10.2 10*3/uL (ref 3.8–10.6)

## 2014-01-23 LAB — TROPONIN I
Troponin-I: 0.23 ng/mL — ABNORMAL HIGH
Troponin-I: 0.22 ng/mL — ABNORMAL HIGH

## 2014-01-23 LAB — MAGNESIUM: Magnesium: 1.7 mg/dL — ABNORMAL LOW

## 2014-01-23 LAB — CK-MB
CK-MB: 28.1 ng/mL — ABNORMAL HIGH (ref 0.5–3.6)
CK-MB: 29.3 ng/mL — ABNORMAL HIGH (ref 0.5–3.6)

## 2014-01-23 LAB — CK: CK, TOTAL: 5066 U/L — AB

## 2014-01-23 LAB — SEDIMENTATION RATE: Erythrocyte Sed Rate: 86 mm/hr — ABNORMAL HIGH (ref 0–20)

## 2014-01-24 LAB — URINALYSIS, COMPLETE
Bilirubin,UR: NEGATIVE
Ketone: NEGATIVE
LEUKOCYTE ESTERASE: NEGATIVE
NITRITE: NEGATIVE
Ph: 6 (ref 4.5–8.0)
Protein: >=500
RBC,UR: 2 /HPF (ref 0–5)
Specific Gravity: 1.01 (ref 1.003–1.030)
Squamous Epithelial: NONE SEEN
WBC UR: 2 /HPF (ref 0–5)

## 2014-01-24 LAB — BASIC METABOLIC PANEL
Anion Gap: 7 (ref 7–16)
BUN: 33 mg/dL — ABNORMAL HIGH (ref 7–18)
CHLORIDE: 106 mmol/L (ref 98–107)
Calcium, Total: 8.7 mg/dL (ref 8.5–10.1)
Co2: 27 mmol/L (ref 21–32)
Creatinine: 1.74 mg/dL — ABNORMAL HIGH (ref 0.60–1.30)
EGFR (African American): 49 — ABNORMAL LOW
GFR CALC NON AF AMER: 43 — AB
Glucose: 109 mg/dL — ABNORMAL HIGH (ref 65–99)
Osmolality: 287 (ref 275–301)
POTASSIUM: 3.6 mmol/L (ref 3.5–5.1)
Sodium: 140 mmol/L (ref 136–145)

## 2014-01-24 LAB — CK: CK, TOTAL: 6136 U/L — AB

## 2014-01-25 LAB — CK
CK, Total: 9586 U/L — ABNORMAL HIGH
CK, Total: 8808 U/L — ABNORMAL HIGH

## 2014-01-25 LAB — BASIC METABOLIC PANEL
ANION GAP: 5 — AB (ref 7–16)
BUN: 26 mg/dL — AB (ref 7–18)
CREATININE: 1.42 mg/dL — AB (ref 0.60–1.30)
Calcium, Total: 8.4 mg/dL — ABNORMAL LOW (ref 8.5–10.1)
Chloride: 110 mmol/L — ABNORMAL HIGH (ref 98–107)
Co2: 25 mmol/L (ref 21–32)
GFR CALC NON AF AMER: 54 — AB
GLUCOSE: 113 mg/dL — AB (ref 65–99)
Osmolality: 285 (ref 275–301)
Potassium: 3.7 mmol/L (ref 3.5–5.1)
Sodium: 140 mmol/L (ref 136–145)

## 2014-01-26 LAB — CK: CK, Total: 6928 U/L — ABNORMAL HIGH

## 2014-02-19 ENCOUNTER — Inpatient Hospital Stay: Payer: Self-pay | Admitting: Internal Medicine

## 2014-02-19 LAB — CBC WITH DIFFERENTIAL/PLATELET
BASOS PCT: 0.6 %
Basophil #: 0.1 10*3/uL (ref 0.0–0.1)
EOS ABS: 0.4 10*3/uL (ref 0.0–0.7)
Eosinophil %: 4.1 %
HCT: 30.8 % — ABNORMAL LOW (ref 40.0–52.0)
HGB: 10.1 g/dL — AB (ref 13.0–18.0)
LYMPHS ABS: 1.6 10*3/uL (ref 1.0–3.6)
Lymphocyte %: 18.6 %
MCH: 26.7 pg (ref 26.0–34.0)
MCHC: 32.8 g/dL (ref 32.0–36.0)
MCV: 81 fL (ref 80–100)
Monocyte #: 0.9 x10 3/mm (ref 0.2–1.0)
Monocyte %: 10.9 %
NEUTROS PCT: 65.8 %
Neutrophil #: 5.7 10*3/uL (ref 1.4–6.5)
RBC: 3.79 10*6/uL — ABNORMAL LOW (ref 4.40–5.90)
RDW: 16.9 % — ABNORMAL HIGH (ref 11.5–14.5)
WBC: 8.7 10*3/uL (ref 3.8–10.6)

## 2014-02-19 LAB — BASIC METABOLIC PANEL
ANION GAP: 4 — AB (ref 7–16)
BUN: 31 mg/dL — AB (ref 7–18)
CREATININE: 2.22 mg/dL — AB (ref 0.60–1.30)
Calcium, Total: 9.6 mg/dL (ref 8.5–10.1)
Chloride: 94 mmol/L — ABNORMAL LOW (ref 98–107)
Co2: 35 mmol/L — ABNORMAL HIGH (ref 21–32)
EGFR (African American): 37 — ABNORMAL LOW
EGFR (Non-African Amer.): 32 — ABNORMAL LOW
Glucose: 170 mg/dL — ABNORMAL HIGH (ref 65–99)
Osmolality: 277 (ref 275–301)
POTASSIUM: 3.7 mmol/L (ref 3.5–5.1)
Sodium: 133 mmol/L — ABNORMAL LOW (ref 136–145)

## 2014-02-19 LAB — CK TOTAL AND CKMB (NOT AT ARMC)
CK, TOTAL: 58 U/L
CK-MB: 3.2 ng/mL (ref 0.5–3.6)

## 2014-02-19 LAB — TROPONIN I: Troponin-I: <0.02 ng/mL

## 2014-02-19 LAB — PLATELET COUNT: Platelet: 287 10*3/uL (ref 150–440)

## 2014-02-19 LAB — PRO B NATRIURETIC PEPTIDE: B-Type Natriuretic Peptide: 626 pg/mL — ABNORMAL HIGH (ref 0–125)

## 2014-02-20 LAB — HEPATIC FUNCTION PANEL A (ARMC)
Albumin: 1.7 g/dL — ABNORMAL LOW (ref 3.4–5.0)
Alkaline Phosphatase: 110 U/L
BILIRUBIN DIRECT: 0.1 mg/dL (ref 0.00–0.20)
BILIRUBIN TOTAL: 0.4 mg/dL (ref 0.2–1.0)
SGOT(AST): 15 U/L (ref 15–37)
SGPT (ALT): 15 U/L (ref 12–78)
TOTAL PROTEIN: 6.7 g/dL (ref 6.4–8.2)

## 2014-02-20 LAB — BASIC METABOLIC PANEL
Anion Gap: 6 — ABNORMAL LOW (ref 7–16)
BUN: 30 mg/dL — ABNORMAL HIGH (ref 7–18)
CALCIUM: 9.5 mg/dL (ref 8.5–10.1)
Chloride: 93 mmol/L — ABNORMAL LOW (ref 98–107)
Co2: 35 mmol/L — ABNORMAL HIGH (ref 21–32)
Creatinine: 2.21 mg/dL — ABNORMAL HIGH (ref 0.60–1.30)
EGFR (African American): 37 — ABNORMAL LOW
EGFR (Non-African Amer.): 32 — ABNORMAL LOW
Glucose: 136 mg/dL — ABNORMAL HIGH (ref 65–99)
Osmolality: 277 (ref 275–301)
Potassium: 3.3 mmol/L — ABNORMAL LOW (ref 3.5–5.1)
Sodium: 134 mmol/L — ABNORMAL LOW (ref 136–145)

## 2014-02-21 LAB — BASIC METABOLIC PANEL
Anion Gap: 6 — ABNORMAL LOW (ref 7–16)
BUN: 31 mg/dL — AB (ref 7–18)
CALCIUM: 9.1 mg/dL (ref 8.5–10.1)
CHLORIDE: 94 mmol/L — AB (ref 98–107)
Co2: 36 mmol/L — ABNORMAL HIGH (ref 21–32)
Creatinine: 2.39 mg/dL — ABNORMAL HIGH (ref 0.60–1.30)
EGFR (African American): 34 — ABNORMAL LOW
EGFR (Non-African Amer.): 29 — ABNORMAL LOW
GLUCOSE: 115 mg/dL — AB (ref 65–99)
Osmolality: 279 (ref 275–301)
Potassium: 3.4 mmol/L — ABNORMAL LOW (ref 3.5–5.1)
SODIUM: 136 mmol/L (ref 136–145)

## 2014-02-22 LAB — BASIC METABOLIC PANEL
Anion Gap: 6 — ABNORMAL LOW (ref 7–16)
BUN: 36 mg/dL — ABNORMAL HIGH (ref 7–18)
CHLORIDE: 94 mmol/L — AB (ref 98–107)
Calcium, Total: 8.7 mg/dL (ref 8.5–10.1)
Co2: 36 mmol/L — ABNORMAL HIGH (ref 21–32)
Creatinine: 2.44 mg/dL — ABNORMAL HIGH (ref 0.60–1.30)
EGFR (African American): 33 — ABNORMAL LOW
EGFR (Non-African Amer.): 28 — ABNORMAL LOW
GLUCOSE: 115 mg/dL — AB (ref 65–99)
Osmolality: 281 (ref 275–301)
Potassium: 3.2 mmol/L — ABNORMAL LOW (ref 3.5–5.1)
SODIUM: 136 mmol/L (ref 136–145)

## 2014-02-23 LAB — BASIC METABOLIC PANEL
ANION GAP: 5 — AB (ref 7–16)
BUN: 39 mg/dL — ABNORMAL HIGH (ref 7–18)
CALCIUM: 8.4 mg/dL — AB (ref 8.5–10.1)
CHLORIDE: 93 mmol/L — AB (ref 98–107)
Co2: 35 mmol/L — ABNORMAL HIGH (ref 21–32)
Creatinine: 2.45 mg/dL — ABNORMAL HIGH (ref 0.60–1.30)
EGFR (African American): 33 — ABNORMAL LOW
EGFR (Non-African Amer.): 28 — ABNORMAL LOW
Glucose: 154 mg/dL — ABNORMAL HIGH (ref 65–99)
Osmolality: 279 (ref 275–301)
Potassium: 3.2 mmol/L — ABNORMAL LOW (ref 3.5–5.1)
Sodium: 133 mmol/L — ABNORMAL LOW (ref 136–145)

## 2014-02-23 LAB — PLATELET COUNT: Platelet: 343 10*3/uL (ref 150–440)

## 2014-02-24 LAB — COMPREHENSIVE METABOLIC PANEL
ALK PHOS: 83 U/L
ANION GAP: 3 — AB (ref 7–16)
Albumin: 3.2 g/dL — ABNORMAL LOW (ref 3.4–5.0)
BUN: 43 mg/dL — ABNORMAL HIGH (ref 7–18)
Bilirubin,Total: 1.3 mg/dL — ABNORMAL HIGH (ref 0.2–1.0)
CHLORIDE: 94 mmol/L — AB (ref 98–107)
CREATININE: 2.44 mg/dL — AB (ref 0.60–1.30)
Calcium, Total: 8.4 mg/dL — ABNORMAL LOW (ref 8.5–10.1)
Co2: 36 mmol/L — ABNORMAL HIGH (ref 21–32)
EGFR (African American): 33 — ABNORMAL LOW
EGFR (Non-African Amer.): 28 — ABNORMAL LOW
Glucose: 153 mg/dL — ABNORMAL HIGH (ref 65–99)
OSMOLALITY: 280 (ref 275–301)
POTASSIUM: 3.6 mmol/L (ref 3.5–5.1)
SGOT(AST): 13 U/L — ABNORMAL LOW (ref 15–37)
SGPT (ALT): 10 U/L — ABNORMAL LOW (ref 12–78)
SODIUM: 133 mmol/L — AB (ref 136–145)
Total Protein: 7.4 g/dL (ref 6.4–8.2)

## 2014-04-16 DIAGNOSIS — E1169 Type 2 diabetes mellitus with other specified complication: Secondary | ICD-10-CM

## 2014-04-16 DIAGNOSIS — E669 Obesity, unspecified: Secondary | ICD-10-CM | POA: Insufficient documentation

## 2014-04-16 DIAGNOSIS — E1159 Type 2 diabetes mellitus with other circulatory complications: Secondary | ICD-10-CM

## 2014-04-16 DIAGNOSIS — I10 Essential (primary) hypertension: Secondary | ICD-10-CM

## 2014-04-16 DIAGNOSIS — E1140 Type 2 diabetes mellitus with diabetic neuropathy, unspecified: Secondary | ICD-10-CM | POA: Insufficient documentation

## 2014-04-24 IMAGING — CR DG CHEST 2V
1 series · 2 of 2 positions shown · non-contrast
Comparison: none

REASON FOR EXAM: diabetes; sleep apnea
COMMENTS:   LMP: (Male)

[Series 1: w chest pa · 0.14mm/px · 2 of 2 slices shown]
[im 1/2]
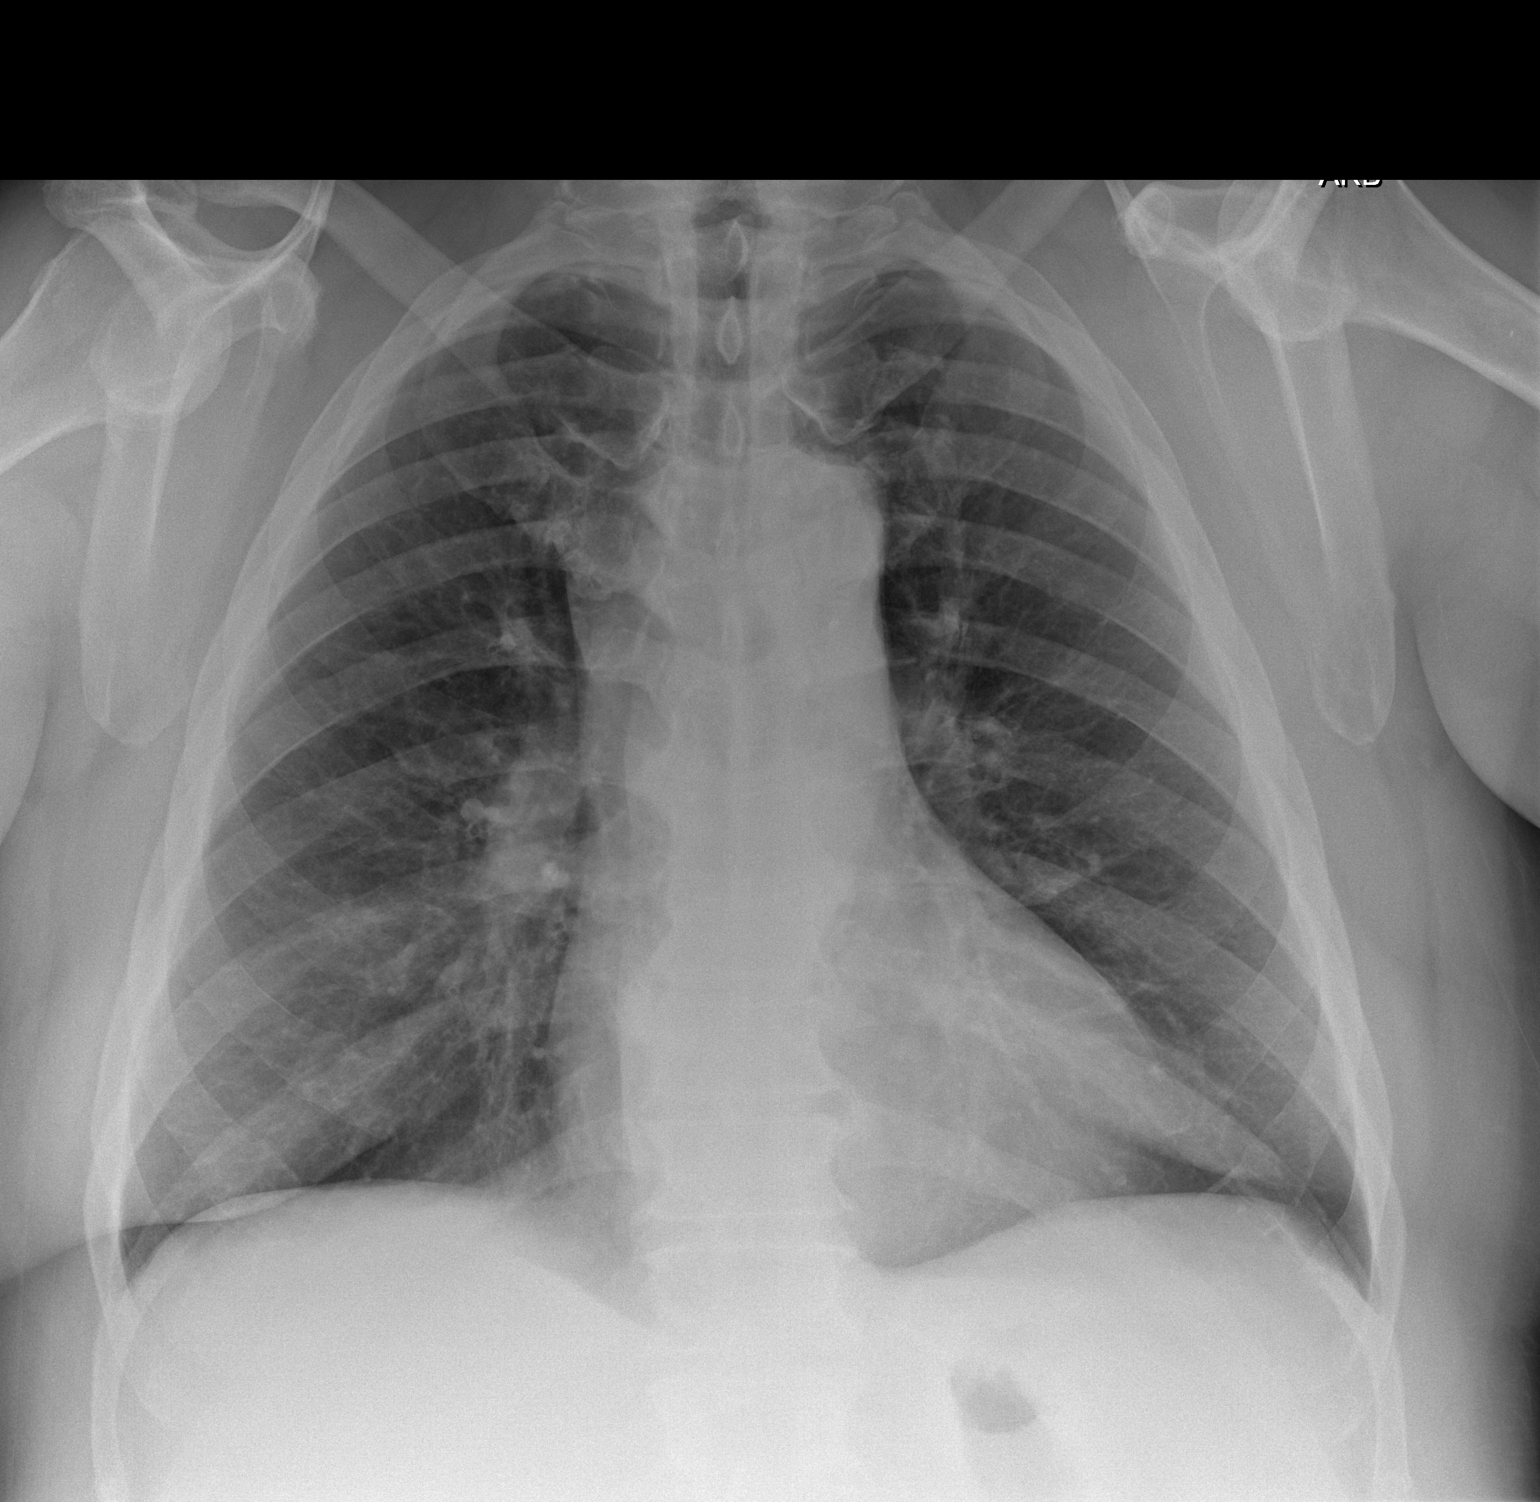
[im 2/2]
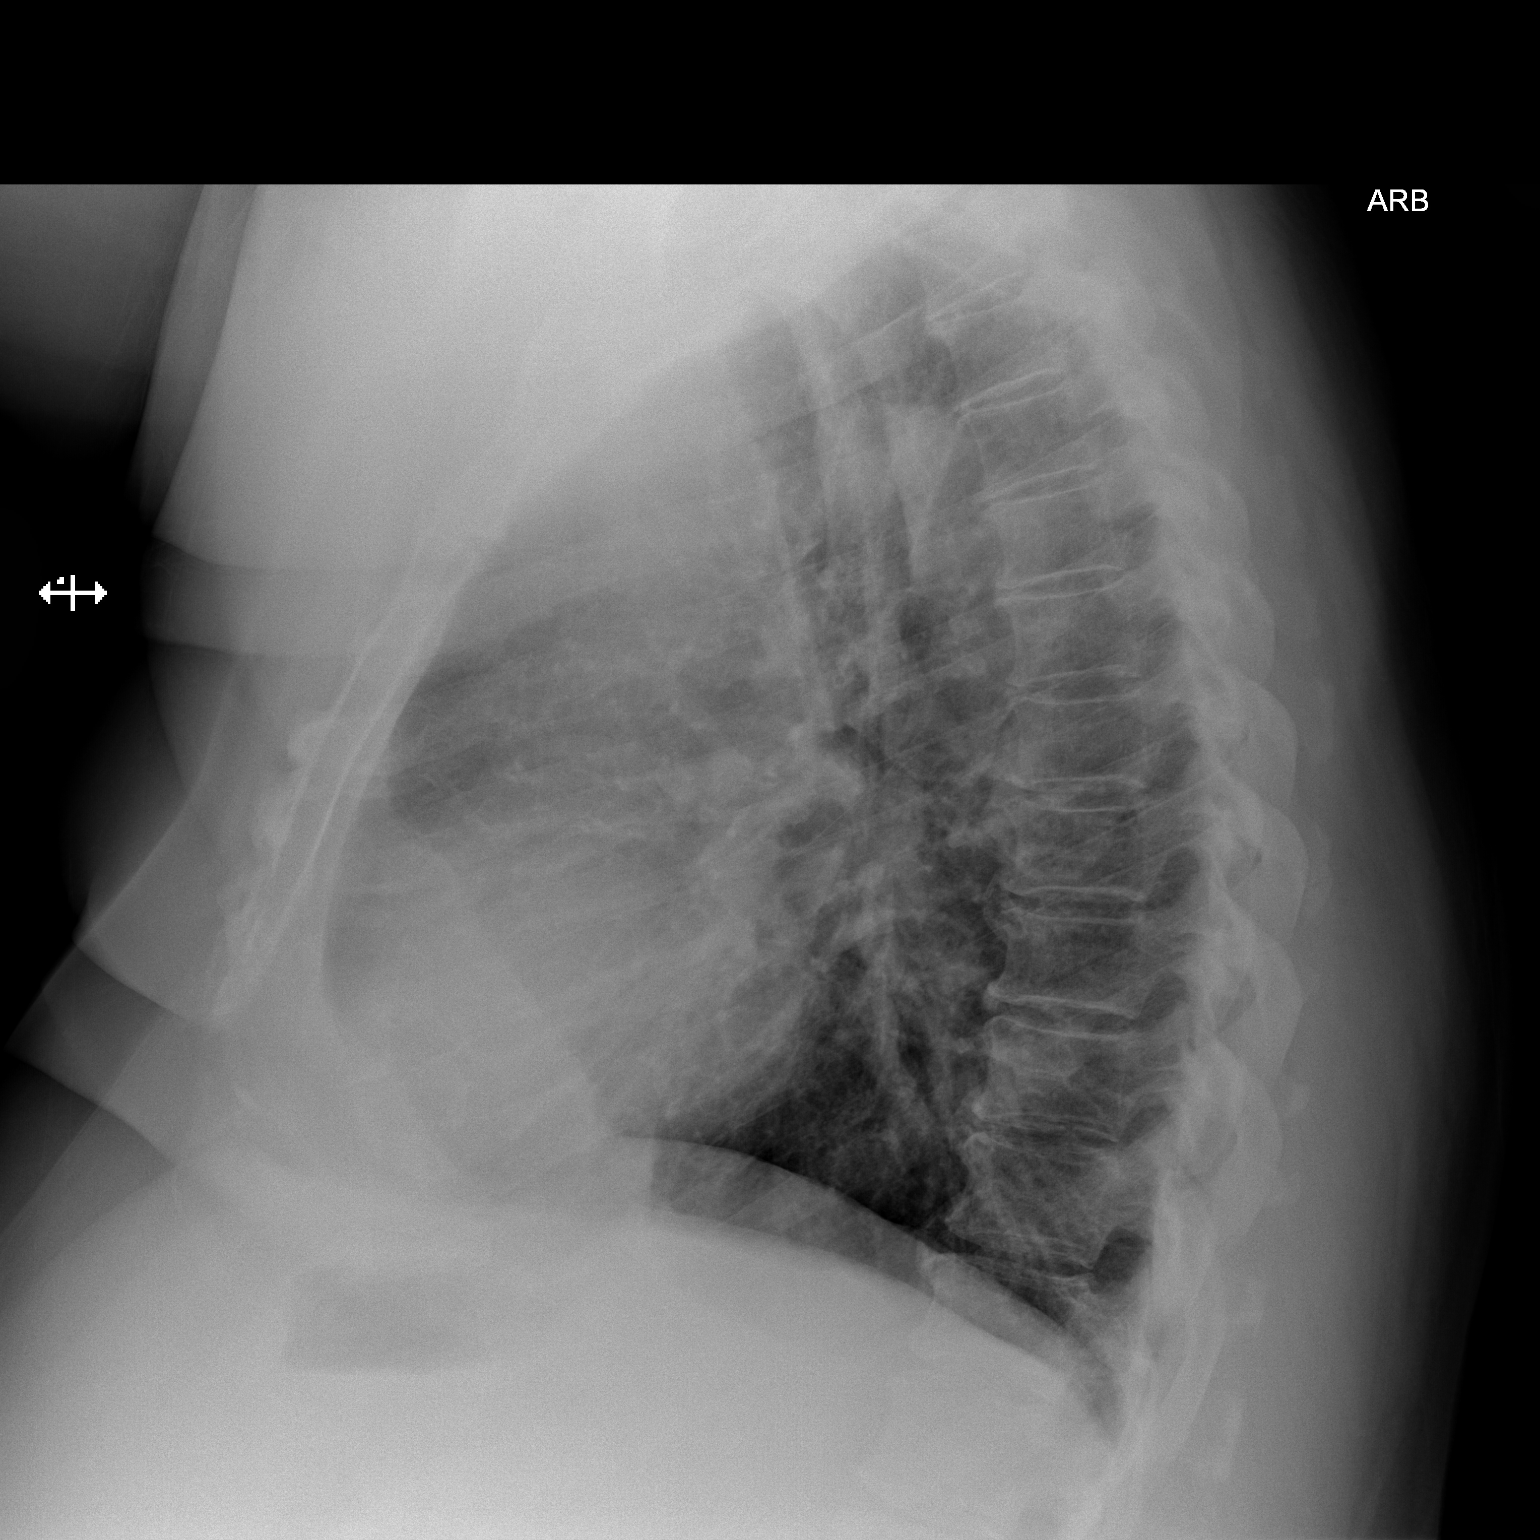

[2 of 2 positions shown; findings below may reference images not displayed]

PROCEDURE:     DXR - DXR CHEST PA (OR AP) AND LATERAL  - July 03, 2013  [DATE]

RESULT:     Comparison is made to the study March 09, 2013.

The lungs are well-expanded. There is mild hemidiaphragm flattening. The
cardiac silhouette is normal in size. The mediastinum is normal in width.
The perihilar interstitial markings are coarse but are not as conspicuous as
on the earlier study.
IMPRESSION: 1. There is hyperinflation consistent with COPD or reactive airway disease.
2. Coarse interstitial markings in the perihilar and infrahilar region are
present and are nonspecific. This could reflect subsegmental atelectasis or
be related the patient's previous smoking history.
3. There is no definite evidence of CHF.

[REDACTED]

## 2014-05-30 ENCOUNTER — Inpatient Hospital Stay: Payer: Self-pay | Admitting: Internal Medicine

## 2014-05-30 LAB — BASIC METABOLIC PANEL
Anion Gap: 10 (ref 7–16)
BUN: 108 mg/dL — AB (ref 7–18)
CALCIUM: 9.1 mg/dL (ref 8.5–10.1)
Chloride: 90 mmol/L — ABNORMAL LOW (ref 98–107)
Co2: 27 mmol/L (ref 21–32)
Creatinine: 4.53 mg/dL — ABNORMAL HIGH (ref 0.60–1.30)
GFR CALC AF AMER: 16 — AB
GFR CALC NON AF AMER: 13 — AB
GLUCOSE: 251 mg/dL — AB (ref 65–99)
Osmolality: 298 (ref 275–301)
Potassium: 4.4 mmol/L (ref 3.5–5.1)
Sodium: 127 mmol/L — ABNORMAL LOW (ref 136–145)

## 2014-05-30 LAB — CBC
HCT: 31.7 % — AB (ref 40.0–52.0)
HGB: 10.2 g/dL — AB (ref 13.0–18.0)
MCH: 27.5 pg (ref 26.0–34.0)
MCHC: 32.2 g/dL (ref 32.0–36.0)
MCV: 85 fL (ref 80–100)
PLATELETS: 466 10*3/uL — AB (ref 150–440)
RBC: 3.72 10*6/uL — ABNORMAL LOW (ref 4.40–5.90)
RDW: 17.8 % — AB (ref 11.5–14.5)
WBC: 15.4 10*3/uL — AB (ref 3.8–10.6)

## 2014-05-30 LAB — TROPONIN I

## 2014-05-30 LAB — PRO B NATRIURETIC PEPTIDE: B-Type Natriuretic Peptide: 1617 pg/mL — ABNORMAL HIGH (ref 0–125)

## 2014-05-31 LAB — CBC WITH DIFFERENTIAL/PLATELET
BASOS ABS: 0 10*3/uL (ref 0.0–0.1)
BASOS PCT: 0.3 %
Eosinophil #: 0 10*3/uL (ref 0.0–0.7)
Eosinophil %: 0 %
HCT: 30.3 % — AB (ref 40.0–52.0)
HGB: 10.3 g/dL — AB (ref 13.0–18.0)
Lymphocyte #: 0.7 10*3/uL — ABNORMAL LOW (ref 1.0–3.6)
Lymphocyte %: 6.9 %
MCH: 28.2 pg (ref 26.0–34.0)
MCHC: 33.9 g/dL (ref 32.0–36.0)
MCV: 83 fL (ref 80–100)
Monocyte #: 0.2 x10 3/mm (ref 0.2–1.0)
Monocyte %: 2 %
NEUTROS ABS: 9.1 10*3/uL — AB (ref 1.4–6.5)
NEUTROS PCT: 90.8 %
Platelet: 446 10*3/uL — ABNORMAL HIGH (ref 150–440)
RBC: 3.65 10*6/uL — ABNORMAL LOW (ref 4.40–5.90)
RDW: 18 % — ABNORMAL HIGH (ref 11.5–14.5)
WBC: 10.1 10*3/uL (ref 3.8–10.6)

## 2014-05-31 LAB — BASIC METABOLIC PANEL
Anion Gap: 10 (ref 7–16)
BUN: 114 mg/dL — AB (ref 7–18)
CALCIUM: 9.1 mg/dL (ref 8.5–10.1)
Chloride: 91 mmol/L — ABNORMAL LOW (ref 98–107)
Co2: 28 mmol/L (ref 21–32)
Creatinine: 4.4 mg/dL — ABNORMAL HIGH (ref 0.60–1.30)
EGFR (African American): 16 — ABNORMAL LOW
GFR CALC NON AF AMER: 14 — AB
Glucose: 290 mg/dL — ABNORMAL HIGH (ref 65–99)
OSMOLALITY: 306 (ref 275–301)
Potassium: 4.5 mmol/L (ref 3.5–5.1)
Sodium: 129 mmol/L — ABNORMAL LOW (ref 136–145)

## 2014-06-01 LAB — BASIC METABOLIC PANEL
Anion Gap: 11 (ref 7–16)
BUN: 121 mg/dL — ABNORMAL HIGH (ref 7–18)
CALCIUM: 8.8 mg/dL (ref 8.5–10.1)
CO2: 28 mmol/L (ref 21–32)
Chloride: 92 mmol/L — ABNORMAL LOW (ref 98–107)
Creatinine: 3.59 mg/dL — ABNORMAL HIGH (ref 0.60–1.30)
EGFR (African American): 21 — ABNORMAL LOW
EGFR (Non-African Amer.): 18 — ABNORMAL LOW
GLUCOSE: 256 mg/dL — AB (ref 65–99)
OSMOLALITY: 310 (ref 275–301)
Potassium: 4.1 mmol/L (ref 3.5–5.1)
SODIUM: 131 mmol/L — AB (ref 136–145)

## 2014-06-04 LAB — CULTURE, BLOOD (SINGLE)

## 2014-08-04 ENCOUNTER — Emergency Department: Payer: Self-pay | Admitting: Emergency Medicine

## 2014-09-17 ENCOUNTER — Observation Stay: Payer: Self-pay | Admitting: Internal Medicine

## 2014-09-17 LAB — URINALYSIS, COMPLETE
BACTERIA: NONE SEEN
BILIRUBIN, UR: NEGATIVE
BLOOD: NEGATIVE
Hyaline Cast: 5
Ketone: NEGATIVE
LEUKOCYTE ESTERASE: NEGATIVE
NITRITE: NEGATIVE
PH: 5 (ref 4.5–8.0)
RBC,UR: 3 /HPF (ref 0–5)
SPECIFIC GRAVITY: 1.01 (ref 1.003–1.030)

## 2014-09-17 LAB — TROPONIN I
TROPONIN-I: 0.09 ng/mL — AB
Troponin-I: 0.09 ng/mL — ABNORMAL HIGH
Troponin-I: 0.09 ng/mL — ABNORMAL HIGH

## 2014-09-17 LAB — CBC WITH DIFFERENTIAL/PLATELET
BASOS PCT: 0.8 %
Basophil #: 0.1 10*3/uL (ref 0.0–0.1)
Eosinophil #: 0.1 10*3/uL (ref 0.0–0.7)
Eosinophil %: 1.2 %
HCT: 41.5 % (ref 40.0–52.0)
HGB: 13.9 g/dL (ref 13.0–18.0)
LYMPHS ABS: 2.1 10*3/uL (ref 1.0–3.6)
LYMPHS PCT: 22.2 %
MCH: 27.8 pg (ref 26.0–34.0)
MCHC: 33.4 g/dL (ref 32.0–36.0)
MCV: 83 fL (ref 80–100)
MONO ABS: 0.7 x10 3/mm (ref 0.2–1.0)
Monocyte %: 7.3 %
Neutrophil #: 6.6 10*3/uL — ABNORMAL HIGH (ref 1.4–6.5)
Neutrophil %: 68.5 %
Platelet: 368 10*3/uL (ref 150–440)
RBC: 5 10*6/uL (ref 4.40–5.90)
RDW: 16 % — ABNORMAL HIGH (ref 11.5–14.5)
WBC: 9.6 10*3/uL (ref 3.8–10.6)

## 2014-09-17 LAB — PROTIME-INR
INR: 1.1
PROTHROMBIN TIME: 13.6 s (ref 11.5–14.7)

## 2014-09-17 LAB — BASIC METABOLIC PANEL
Anion Gap: 10 (ref 7–16)
BUN: 98 mg/dL — ABNORMAL HIGH (ref 7–18)
CALCIUM: 9.1 mg/dL (ref 8.5–10.1)
CHLORIDE: 93 mmol/L — AB (ref 98–107)
Co2: 34 mmol/L — ABNORMAL HIGH (ref 21–32)
Creatinine: 3.08 mg/dL — ABNORMAL HIGH (ref 0.60–1.30)
EGFR (African American): 27 — ABNORMAL LOW
EGFR (Non-African Amer.): 22 — ABNORMAL LOW
Glucose: 195 mg/dL — ABNORMAL HIGH (ref 65–99)
OSMOLALITY: 310 (ref 275–301)
Potassium: 3.1 mmol/L — ABNORMAL LOW (ref 3.5–5.1)
SODIUM: 137 mmol/L (ref 136–145)

## 2014-09-17 LAB — APTT: Activated PTT: 38.2 secs — ABNORMAL HIGH (ref 23.6–35.9)

## 2014-09-18 LAB — BASIC METABOLIC PANEL
Anion Gap: 9 (ref 7–16)
BUN: 94 mg/dL — AB (ref 7–18)
CHLORIDE: 93 mmol/L — AB (ref 98–107)
CO2: 33 mmol/L — AB (ref 21–32)
Calcium, Total: 8.5 mg/dL (ref 8.5–10.1)
Creatinine: 3.08 mg/dL — ABNORMAL HIGH (ref 0.60–1.30)
EGFR (African American): 27 — ABNORMAL LOW
EGFR (Non-African Amer.): 22 — ABNORMAL LOW
GLUCOSE: 95 mg/dL (ref 65–99)
Osmolality: 299 (ref 275–301)
Potassium: 2.6 mmol/L — ABNORMAL LOW (ref 3.5–5.1)
SODIUM: 135 mmol/L — AB (ref 136–145)

## 2014-09-18 LAB — CBC WITH DIFFERENTIAL/PLATELET
Basophil #: 0.1 10*3/uL (ref 0.0–0.1)
Basophil %: 0.8 %
Eosinophil #: 0.2 10*3/uL (ref 0.0–0.7)
Eosinophil %: 2.4 %
HCT: 38.6 % — ABNORMAL LOW (ref 40.0–52.0)
HGB: 13.1 g/dL (ref 13.0–18.0)
Lymphocyte #: 3.3 10*3/uL (ref 1.0–3.6)
Lymphocyte %: 46.3 %
MCH: 28.3 pg (ref 26.0–34.0)
MCHC: 34.1 g/dL (ref 32.0–36.0)
MCV: 83 fL (ref 80–100)
Monocyte #: 0.7 x10 3/mm (ref 0.2–1.0)
Monocyte %: 9.1 %
Neutrophil #: 3 10*3/uL (ref 1.4–6.5)
Neutrophil %: 41.4 %
Platelet: 318 10*3/uL (ref 150–440)
RBC: 4.65 10*6/uL (ref 4.40–5.90)
RDW: 15.9 % — ABNORMAL HIGH (ref 11.5–14.5)
WBC: 7.2 10*3/uL (ref 3.8–10.6)

## 2014-09-18 LAB — TSH: Thyroid Stimulating Horm: 0.872 u[IU]/mL

## 2014-09-18 LAB — HEMOGLOBIN A1C: Hemoglobin A1C: 9.9 % — ABNORMAL HIGH (ref 4.2–6.3)

## 2014-09-25 IMAGING — US US EXTREM LOW VENOUS BILAT
1 series · 14 of 24 positions shown · non-contrast
Comparison: Recent prior duplex venous ultrasound 11/23/2013

CLINICAL DATA: Bilateral lower extremity swelling

EXAM:
Bilateral LOWER EXTREMITY VENOUS DOPPLER ULTRASOUND
TECHNIQUE: Gray-scale sonography with graded compression, as well as color
Doppler and duplex ultrasound, were performed to evaluate the deep
venous system from the level of the common femoral vein through the
popliteal and proximal calf veins. Spectral Doppler was utilized to
evaluate flow at rest and with distal augmentation maneuvers.

[Series 1: us extrem low venous bilat · 0.13mm/px · 14 of 67 slices shown]
[im 1/67]
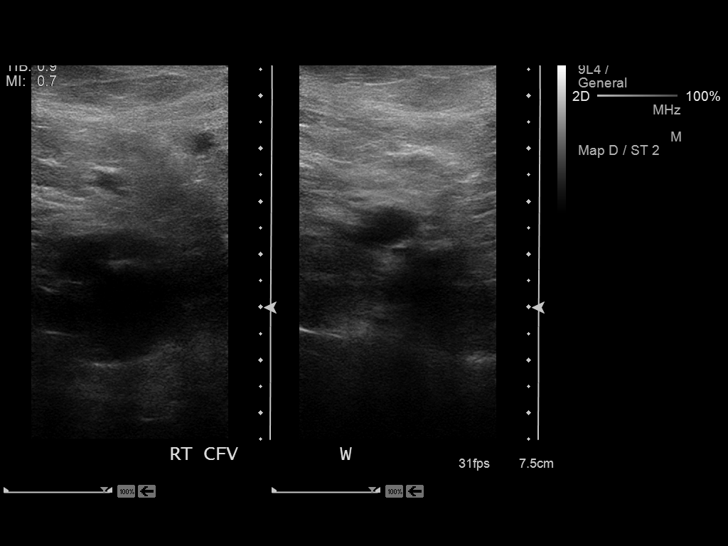
[im 6/67]
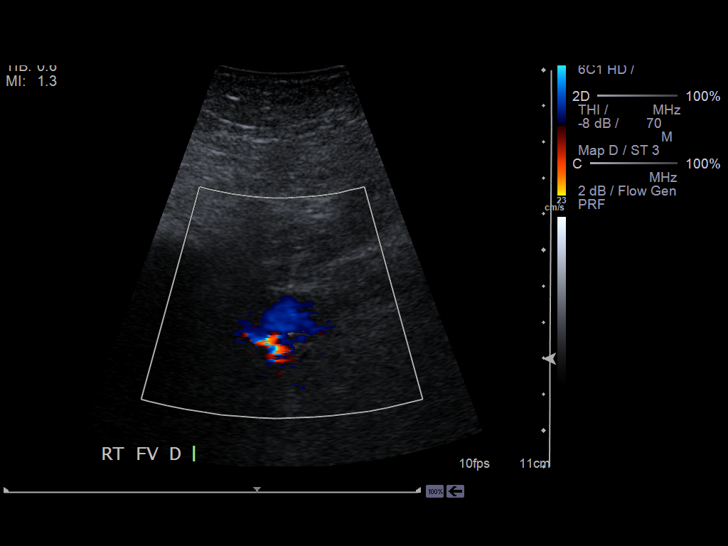
[im 12/67]
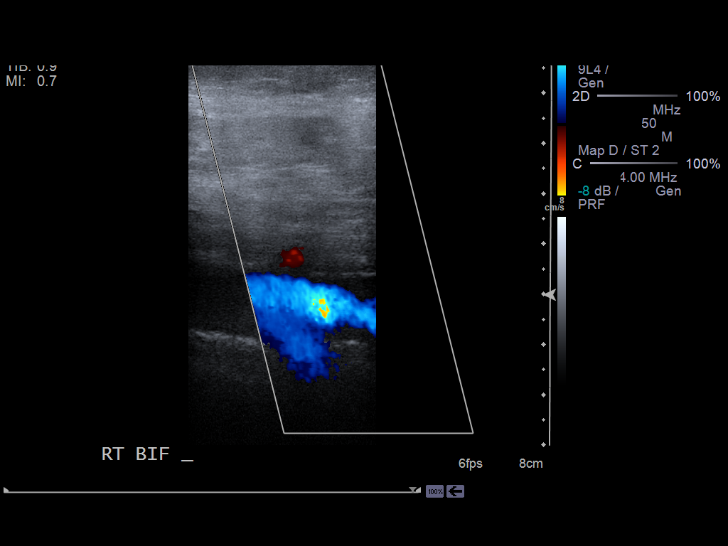
[im 18/67]
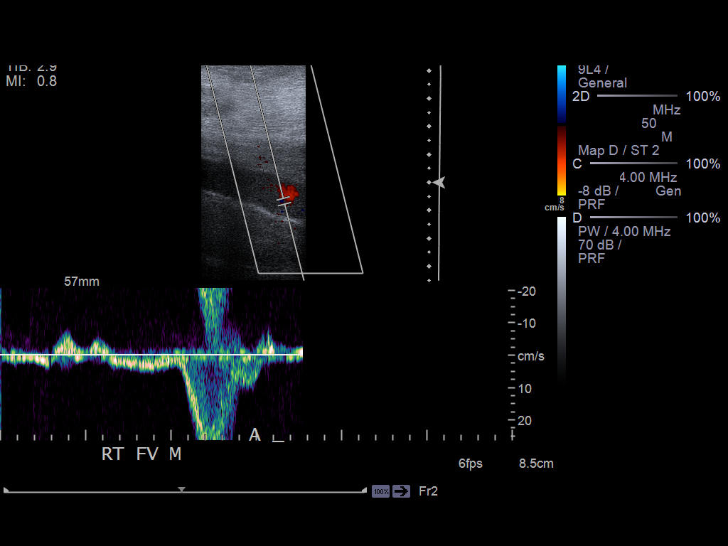
[im 21/67]
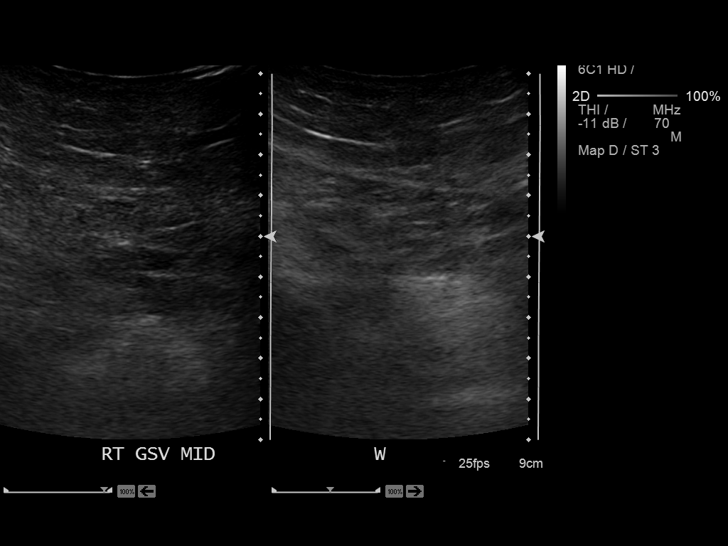
[im 26/67]
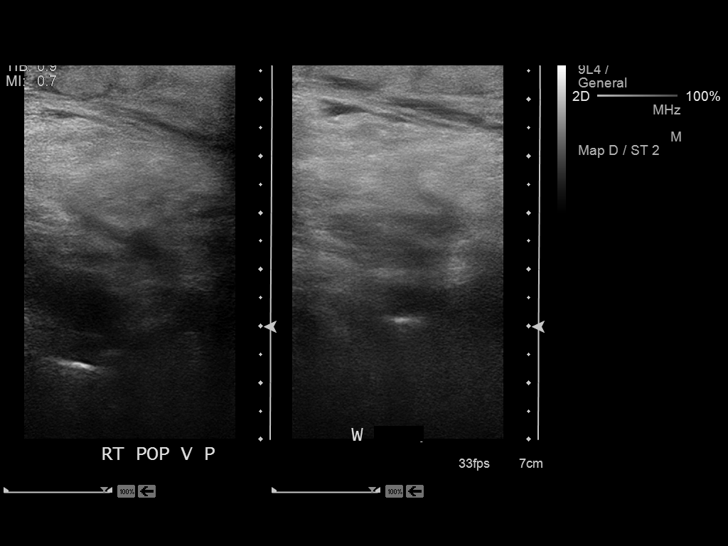
[im 32/67]
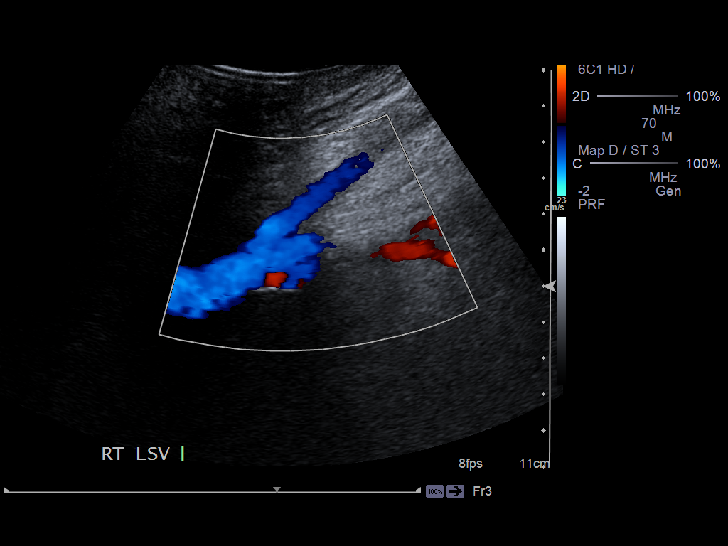
[im 35/67]
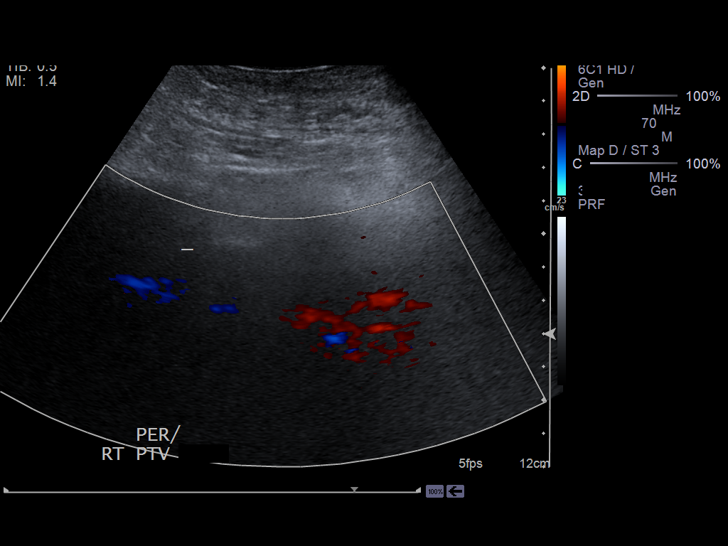
[im 41/67]
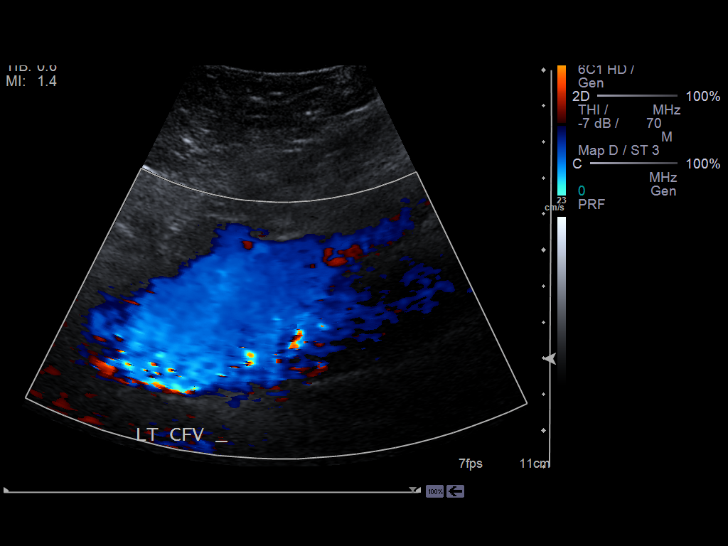
[im 46/67]
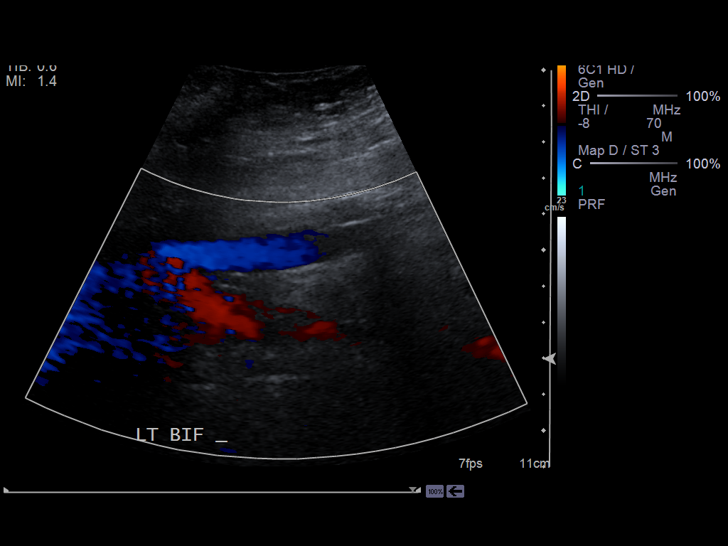
[im 52/67]
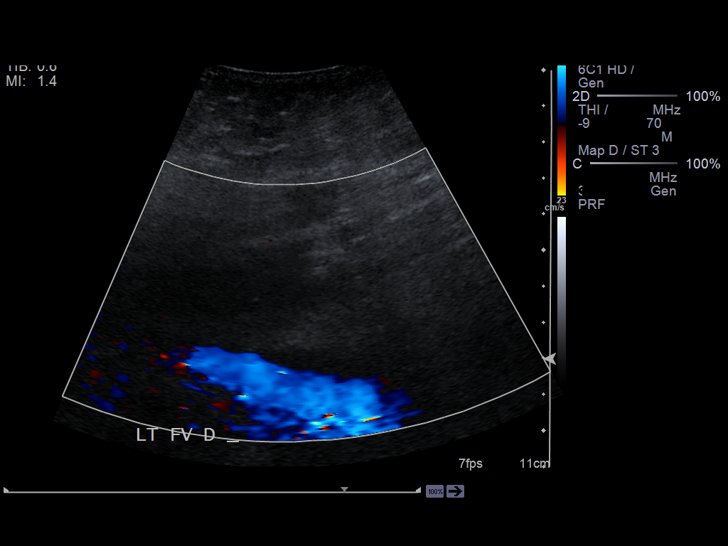
[im 55/67]
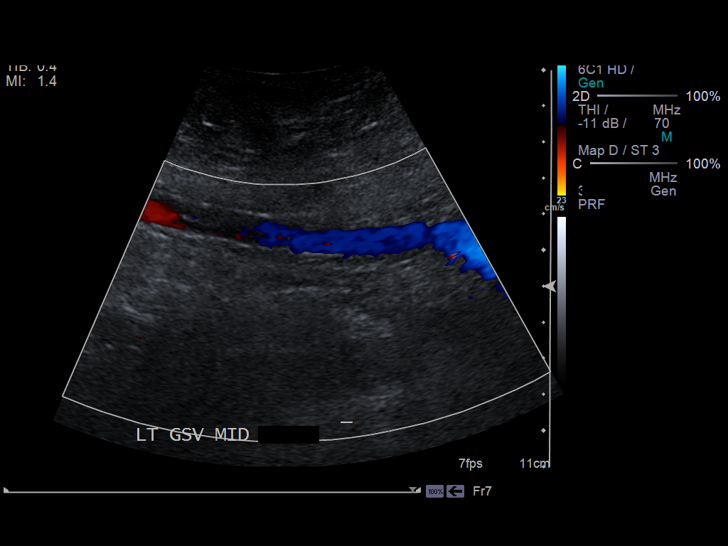
[im 61/67]
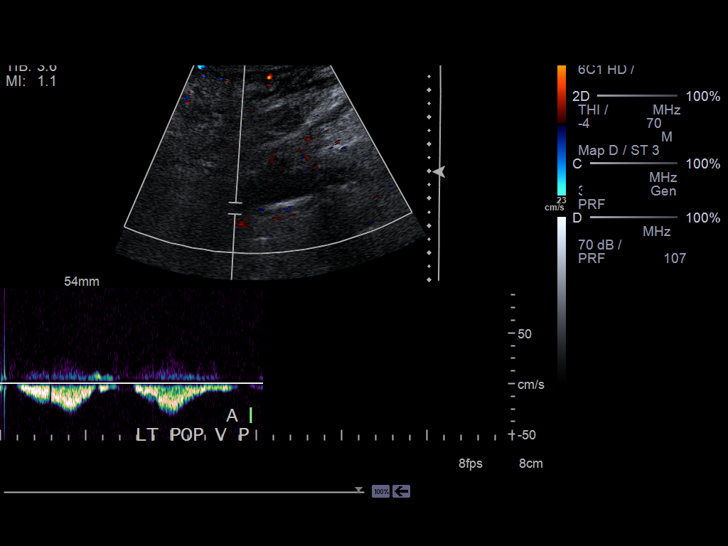
[im 67/67]
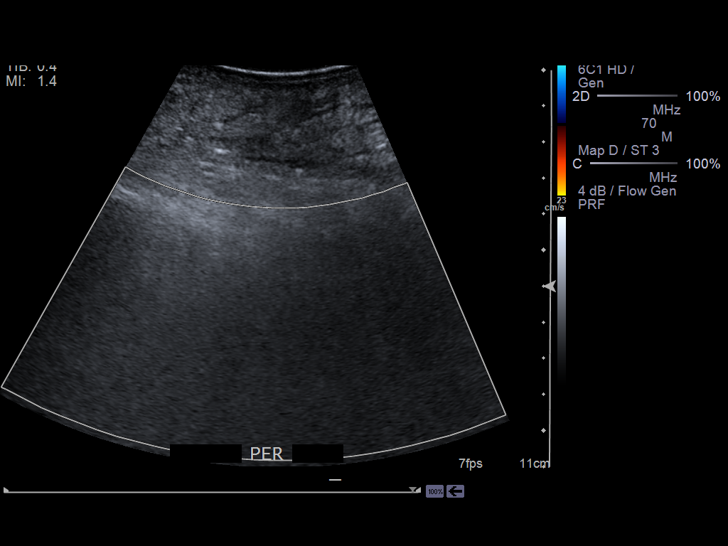

[14 of 24 positions shown; findings below may reference images not displayed]

FINDINGS: Limitations: Technically difficult examination secondary to patient
body habitus. The calf veins are not visible in either leg.

Thrombus within deep veins:  None visualized.

Compressibility of deep veins:  Normal.

Duplex waveform respiratory phasicity:  Normal.

Duplex waveform response to augmentation:  Normal.

Venous reflux:  None visualized.

Other findings: Superficial great saphenous veins are patent
bilaterally to the level of the knee.
IMPRESSION: No evidence of acute DVT in either lower extremity to the knee. The
calf veins cannot be assessed sonographically due to limitations of
patient body habitus.

## 2014-10-15 ENCOUNTER — Inpatient Hospital Stay: Payer: Self-pay | Admitting: Internal Medicine

## 2014-10-15 LAB — CBC
HCT: 32.9 % — AB (ref 40.0–52.0)
HGB: 10.8 g/dL — ABNORMAL LOW (ref 13.0–18.0)
MCH: 28.1 pg (ref 26.0–34.0)
MCHC: 32.9 g/dL (ref 32.0–36.0)
MCV: 86 fL (ref 80–100)
Platelet: 393 10*3/uL (ref 150–440)
RBC: 3.85 10*6/uL — AB (ref 4.40–5.90)
RDW: 16.9 % — AB (ref 11.5–14.5)
WBC: 10.4 10*3/uL (ref 3.8–10.6)

## 2014-10-15 LAB — COMPREHENSIVE METABOLIC PANEL
ALBUMIN: 2.5 g/dL — AB (ref 3.4–5.0)
ANION GAP: 10 (ref 7–16)
Alkaline Phosphatase: 82 U/L
BUN: 78 mg/dL — AB (ref 7–18)
Bilirubin,Total: 0.5 mg/dL (ref 0.2–1.0)
CALCIUM: 9.5 mg/dL (ref 8.5–10.1)
Chloride: 93 mmol/L — ABNORMAL LOW (ref 98–107)
Co2: 33 mmol/L — ABNORMAL HIGH (ref 21–32)
Creatinine: 3.51 mg/dL — ABNORMAL HIGH (ref 0.60–1.30)
EGFR (African American): 23 — ABNORMAL LOW
GFR CALC NON AF AMER: 19 — AB
Glucose: 76 mg/dL (ref 65–99)
OSMOLALITY: 294 (ref 275–301)
Potassium: 2.5 mmol/L — CL (ref 3.5–5.1)
SGOT(AST): 38 U/L — ABNORMAL HIGH (ref 15–37)
SGPT (ALT): 16 U/L
Sodium: 136 mmol/L (ref 136–145)
TOTAL PROTEIN: 7.7 g/dL (ref 6.4–8.2)

## 2014-10-15 LAB — URINALYSIS, COMPLETE
Bacteria: NONE SEEN
Bilirubin,UR: NEGATIVE
Ketone: NEGATIVE
Nitrite: NEGATIVE
Ph: 7 (ref 4.5–8.0)
RBC,UR: 3 /HPF (ref 0–5)
SPECIFIC GRAVITY: 1.009 (ref 1.003–1.030)
Squamous Epithelial: NONE SEEN
WBC UR: 49 /HPF (ref 0–5)

## 2014-10-15 LAB — CK TOTAL AND CKMB (NOT AT ARMC)
CK, TOTAL: 475 U/L — AB (ref 39–308)
CK, TOTAL: 405 U/L — AB (ref 39–308)
CK, Total: 380 U/L — ABNORMAL HIGH (ref 39–308)
CK-MB: 6.7 ng/mL — AB (ref 0.5–3.6)
CK-MB: 6.7 ng/mL — AB (ref 0.5–3.6)
CK-MB: 6.3 ng/mL — ABNORMAL HIGH (ref 0.5–3.6)

## 2014-10-15 LAB — MAGNESIUM: Magnesium: 2.9 mg/dL — ABNORMAL HIGH

## 2014-10-15 LAB — AMMONIA: Ammonia, Plasma: 35 mcmol/L — ABNORMAL HIGH (ref 11–32)

## 2014-10-15 LAB — TROPONIN I
TROPONIN-I: 0.06 ng/mL — AB
TROPONIN-I: 0.06 ng/mL — AB
Troponin-I: 0.06 ng/mL — ABNORMAL HIGH

## 2014-10-15 LAB — PROTIME-INR
INR: 1.1
Prothrombin Time: 14.1 secs (ref 11.5–14.7)

## 2014-10-15 LAB — CK: CK, Total: 458 U/L — ABNORMAL HIGH (ref 39–308)

## 2014-10-16 LAB — CBC WITH DIFFERENTIAL/PLATELET
BASOS ABS: 0.1 10*3/uL (ref 0.0–0.1)
BASOS PCT: 0.5 %
EOS PCT: 1.3 %
Eosinophil #: 0.1 10*3/uL (ref 0.0–0.7)
HCT: 31.5 % — ABNORMAL LOW (ref 40.0–52.0)
HGB: 10.4 g/dL — ABNORMAL LOW (ref 13.0–18.0)
LYMPHS ABS: 1.6 10*3/uL (ref 1.0–3.6)
LYMPHS PCT: 15.4 %
MCH: 28.4 pg (ref 26.0–34.0)
MCHC: 33.1 g/dL (ref 32.0–36.0)
MCV: 86 fL (ref 80–100)
MONOS PCT: 9 %
Monocyte #: 0.9 x10 3/mm (ref 0.2–1.0)
NEUTROS ABS: 7.5 10*3/uL — AB (ref 1.4–6.5)
NEUTROS PCT: 73.8 %
Platelet: 396 10*3/uL (ref 150–440)
RBC: 3.66 10*6/uL — AB (ref 4.40–5.90)
RDW: 16.9 % — ABNORMAL HIGH (ref 11.5–14.5)
WBC: 10.2 10*3/uL (ref 3.8–10.6)

## 2014-10-16 LAB — COMPREHENSIVE METABOLIC PANEL
ALBUMIN: 2.4 g/dL — AB (ref 3.4–5.0)
ALK PHOS: 81 U/L
ANION GAP: 9 (ref 7–16)
BUN: 76 mg/dL — AB (ref 7–18)
Bilirubin,Total: 0.3 mg/dL (ref 0.2–1.0)
CREATININE: 2.92 mg/dL — AB (ref 0.60–1.30)
Calcium, Total: 9.1 mg/dL (ref 8.5–10.1)
Chloride: 94 mmol/L — ABNORMAL LOW (ref 98–107)
Co2: 31 mmol/L (ref 21–32)
EGFR (Non-African Amer.): 24 — ABNORMAL LOW
GFR CALC AF AMER: 29 — AB
Glucose: 176 mg/dL — ABNORMAL HIGH (ref 65–99)
Osmolality: 295 (ref 275–301)
Potassium: 2.6 mmol/L — ABNORMAL LOW (ref 3.5–5.1)
SGOT(AST): 29 U/L (ref 15–37)
SGPT (ALT): 14 U/L
Sodium: 134 mmol/L — ABNORMAL LOW (ref 136–145)
TOTAL PROTEIN: 7.3 g/dL (ref 6.4–8.2)

## 2014-10-16 LAB — POTASSIUM: Potassium: 3.3 mmol/L — ABNORMAL LOW (ref 3.5–5.1)

## 2014-10-17 LAB — BASIC METABOLIC PANEL
Anion Gap: 8 (ref 7–16)
BUN: 68 mg/dL — ABNORMAL HIGH (ref 7–18)
CREATININE: 2.85 mg/dL — AB (ref 0.60–1.30)
Calcium, Total: 8.6 mg/dL (ref 8.5–10.1)
Chloride: 94 mmol/L — ABNORMAL LOW (ref 98–107)
Co2: 31 mmol/L (ref 21–32)
EGFR (African American): 30 — ABNORMAL LOW
GFR CALC NON AF AMER: 24 — AB
Glucose: 159 mg/dL — ABNORMAL HIGH (ref 65–99)
Osmolality: 289 (ref 275–301)
POTASSIUM: 3.5 mmol/L (ref 3.5–5.1)
Sodium: 133 mmol/L — ABNORMAL LOW (ref 136–145)

## 2014-10-17 LAB — URINE CULTURE

## 2015-02-11 ENCOUNTER — Ambulatory Visit
Admit: 2015-02-11 | Disposition: A | Discharge status: Home / Self Care | Payer: Self-pay | Attending: Family | Admitting: Family

## 2015-02-11 ENCOUNTER — Other Ambulatory Visit: Payer: Self-pay | Admitting: *Deleted

## 2015-02-12 DIAGNOSIS — I5032 Chronic diastolic (congestive) heart failure: Secondary | ICD-10-CM

## 2015-02-12 NOTE — Patient Outreach (Signed)
[facility]) Care Management   02/12/2015  Carl Perry 31-Dec-1955 [id number]  Carl Perry is an 59 y.o. male  Subjective:  Pt reports went to HF clinic today, saw [facility],  lungs clear.  Pt states Inetta Fermo is going to hook him up with Dr. Mariah Milling.  Pt weight today is 274 lbs., ranges 273-275 lbs.  Pt states still not taking Verapamil, to see Dr.  Ludwig Lean 4/12.   Pt states blood sugars flunctuate on what I eat.  Pt states not been able to check BP.  Spouse states she is to purchase a BP machine at her work.    Objective:   ROS  Physical Exam - lungs clear.  No swelling noted   Current Medications:   Current Outpatient Prescriptions  Medication Sig Dispense Refill  . acetaminophen-codeine (TYLENOL #3) 300-30 MG per tablet Take 1 tablet by mouth 2 (two) times daily.    Marland Kitchen amitriptyline (ELAVIL) 25 MG tablet Take 50 mg by mouth at bedtime.    Marland Kitchen aspirin 81 MG tablet Take 81 mg by mouth daily.    . baclofen (LIORESAL) 20 MG tablet Take 1 tablet by mouth as needed.    . carvedilol (COREG) 12.5 MG tablet Take 25 mg by mouth 2 (two) times daily with a meal.    . carvedilol (COREG) 6.25 MG tablet Take 1 tablet by mouth 2 (two) times daily.    Marland Kitchen docusate sodium (COLACE) 100 MG capsule Take 100 mg by mouth 2 (two) times daily as needed for mild constipation.    . furosemide (LASIX) 40 MG tablet Take 40 mg by mouth 2 (two) times daily.    Marland Kitchen gemfibrozil (LOPID) 600 MG tablet Take 1 tablet by mouth 2 (two) times daily.    Marland Kitchen glipiZIDE (GLUCOTROL) 10 MG tablet Take 1 tablet by mouth as needed.    . insulin aspart (NOVOLOG) 100 UNIT/ML injection Inject into the skin 3 (three) times daily before meals.    . Insulin Detemir (LEVEMIR) 100 UNIT/ML Pen Inject 45 Units into the skin daily at 10 pm.     . lactulose (CHRONULAC) 10 GM/15ML solution Take 15 mLs by mouth daily.    . Liraglutide 18 MG/3ML SOPN Inject 1.6 mg into the skin daily.    . magnesium oxide (MAG-OX) 400 MG tablet  Take 400 mg by mouth 2 (two) times daily.    . metoCLOPramide (REGLAN) 5 MG/5ML solution Take 5 mg by mouth 3 (three) times daily as needed for nausea.    . metolazone (ZAROXOLYN) 5 MG tablet Take 1 tablet by mouth as needed.    . pantoprazole (PROTONIX) 40 MG tablet Take 40 mg by mouth daily.    . potassium chloride SA (K-DUR,KLOR-CON) 20 MEQ tablet Take 20 mEq by mouth daily.    . potassium chloride SA (K-DUR,KLOR-CON) 20 MEQ tablet Take 1 tablet by mouth 2 (two) times daily.    . tamsulosin (FLOMAX) 0.4 MG CAPS capsule Take 0.4 mg by mouth daily.    . Tiotropium Bromide Monohydrate 2.5 MCG/ACT AERS Inhale 2.5 mcg into the lungs daily.    Marland Kitchen torsemide (DEMADEX) 100 MG tablet Take 1 tablet by mouth 2 (two) times daily. As needed    . traMADol (ULTRAM) 50 MG tablet Take 50 mg by mouth every 6 (six) hours as needed.    . venlafaxine (EFFEXOR) 37.5 MG tablet Take 37.5 mg by mouth 2 (two) times daily.    Marland Kitchen venlafaxine XR (EFFEXOR-XR) 75  MG 24 hr capsule Take 1 capsule by mouth daily.     No current facility-administered medications for this visit.    Assessment:   HF- weights stable.   F/u on diet education needed- Low Na+.  Education provided on reading labels,  Limit canned soups, ramen noodles, choose low sodium soups.   Plan:  Spouse to purchase BP machine, start checking BP/record/bring to MD visits.            As discussed with pt/spouse, plan to transfer pt to [facility] health coach-  Reinforce education provided.     Shayne Alkenose M.   Pierzchala RN CCM [facility] Care Management  [phone]

## 2015-02-28 NOTE — Consult Note (Signed)
Brief Consult Note: Diagnosis: diabetic ulcer with cellulitis right great toe.   Patient was seen by consultant.   Consult note dictated.   Orders entered.   Comments: I did not encounter a sinus tract to bone as of yet.  Suggest holding off on MRI until we see how he responds to todays bedside debridement and the antibiotics.  Will follow tomorrow.  Electronic Signatures: Epimenio Sarinroxler, Lovelle Lema G (MD)  (Signed 28-Apr-14 13:00)  Authored: Brief Consult Note   Last Updated: 28-Apr-14 13:00 by Epimenio Sarinroxler, Hezakiah Champeau G (MD)

## 2015-02-28 NOTE — Op Note (Signed)
PATIENT NAME:  Carl Perry, Carl Perry  DATE OF PROCEDURE:  07/06/2013  PREOPERATIVE DIAGNOSIS: Exostosis of the right proximal phalanx with resultant ulceration status post amputation of the distal phalanx of the right hallux several months ago.   POSTOPERATIVE DIAGNOSIS: Exostosis of the right proximal phalanx with resultant ulceration status post amputation of the distal phalanx of the right hallux several months ago.   PROCEDURE: Excision of distal two thirds of the proximal phalanx on the left hallux in order to alleviate the prominence of bone and the ulceration plantarly.   SURGEON: Recardo EvangelistMatthew Zakyria Metzinger, Carl Perry.   ASSISTANT: None.   HISTORY OF PRESENT ILLNESS: The patient had as noted, amputation of distal phalanx secondary to osteomyelitis several months ago. He had healing of the area, but continued to get an ulceration breaking down. The distal phalanx was removed and a distal amputation was performed of the IP joint, but the tissue continued to break down plantarly as a result of pressure on the proximal phalanx head.  We would get it healed up and then it would continue to breakdown.  We felt like we needed to resect up to the proximal phalanx distal portion in order to resolve the problem.   ANESTHESIA: Local MAC.   ESTIMATED BLOOD LOSS: Negligible.   HEMOSTASIS: Ankle tourniquet to 250 mmHg pressure. Also used part of an Esmarch for tourniquet at the base of the toe as well.    OPERATIVE REPORT: The patient was brought to the OR and placed on the OR table in the supine position. At this point after local anesthesia was achieved and the patient was sedated by anesthesia team, the patient was prepped and draped in the usual sterile manner. Attention was then directed to the distal portion of the toe where an elliptical incision was made over the end of the toe. The ellipse of the skin and soft tissue was removed. The proximal phalanx was identified and  freed up dorsally, mediolaterally and plantarly. A power saw was used to resect the distal two thirds of the proximal phalanx angling the cut from dorsal distal to plantar proximal to alleviate some of the pressure across the plantar aspect of the proximal phalanx area. This dorsal area in particular was then rasped smoothly and copiously irrigated. At this point, 3-0 Vicryl was used to close deep tissues and then 3-0 nylon was used in a combination of vertical mattress and simple interrupted sutures to close the amputation site. At this time attention was directed to the ulceration plantarly on the wound which was excise in an elliptical fashion, and closed with 3-0 nylon simple interrupted sutures. This was irrigated copiously as well. Sterile compressive dressing was placed across the wound consisting of Xeroform gauze, 4 x 4's, Kling and Kerlix. The tourniquet was released. Prompt and complete vascularity was seen to return to all digits of the right foot. The patient appeared to tolerate the procedure and anesthesia well and left the OR for the recovery room with vital signs stable and neurovascular status intact.   ____________________________ Carl Perry, Carl Perry mgt:dp D: 07/06/2013 09:15:00 ET T: 07/06/2013 10:27:05 ET JOB#: [id number]  cc: Carl RaiderMatthew G. Elzada Pytel, Carl Perry, <Dictator> Epimenio SarinMATTHEW G Sandar Krinke MD ELECTRONICALLY SIGNED 07/10/2013 16:58

## 2015-02-28 NOTE — Op Note (Signed)
PATIENT NAME:  Carl Perry, Carl Perry MR#:  [id number] DATE OF BIRTH:  13-Aug-1956  DATE OF PROCEDURE:  03/08/2013  PREOPERATIVE DIAGNOSIS:  Osteomyelitis, distal phalanx, right hallux.   POSTOPERATIVE DIAGNOSIS:  Osteomyelitis, distal phalanx, right hallux.   PROCEDURE:  Amputation distal phalanx at the hallux at the interphalangeal joint level.   SURGEON:  Epimenio SarinMatthew G Milt Coye, DPM.   ASSISTANT:  None.   HISTORY OF PRESENT ILLNESS:  The patient stepped on a staple about 2 or 3 weeks ago, did not realize he was getting an infection and ended up to coming to the hospital and was admitted. I was consulted. Osteomyelitis was likely since the wound probed right straight to bone and an MRI verified the likely presence of osteomyelitis in the region.   ANESTHESIA:  IVA with local.   ESTIMATED BLOOD LOSS:  30 mL.   HEMOSTASIS:  Ankle tourniquet 250 mmHg pressure for about 8 minutes.   DESCRIPTION OF PROCEDURE:  The patient was brought to the Operating Room and placed on the OR table in the supine position. At this point after local anesthesia was achieved, the patient was then prepped and draped in the usual manner. Sedation was achieved by the anesthesia team. Following tourniquet to inflation, the attention was directed to the distal right hallux. A fish-mouth-type incision was made around the tip of the toe including and incorporating the nail and nail matrix portion. This was carried distally around the end of the toe and deepened all the way down to bone. The incision was carried to the point where it was down to bone all around it. The bone was carefully shelled out at the IPJ level and removed. The dorsum of the toe nail complex and the distal phalanx were all sent to Pathology for evaluation. The deep wound culture previously was showing staph aureus but not specific as of yet. The area was then copiously irrigated. There were two areas of tissue damage at this point; one plantar medial and one plantar  lateral. This was all cleaned up with a Versajet. Dead tissue was removed and the area was copiously irrigated once again. After extensive irrigation, the subcutaneous tissue was reapproximated with 3 sutures of 3-0 Vicryl simple interrupted. And at this point, the rest of the amputation site was closed with 3-0 nylon simple interrupted sutures. Some remodeling of the distal lateral wound was achieved in order to get primary closure. The patient tolerated this well. A sterile compressive dressing was placed across the wound consisting of Xeroform gauze, 4 x 4's, Kling, Kerlix and ABD pads. The patient tolerated the procedure and anesthesia well and left the OR for the recovery room with vital signs stable and neurovascular status intact. The tourniquet as noted was released at 7 minutes, and prompt and complete vascularity was seen to return to all digits. Bleeders were clamped and Bovied as required.   ____________________________ Rhona RaiderMatthew G. Bill Yohn, DPM mgt:jm D: 03/08/2013 14:42:02 ET T: 03/08/2013 15:07:34 ET JOB#: [id number]  cc: Rhona RaiderMatthew G. Sohil Timko, DPM, <Dictator> Epimenio SarinMATTHEW G Clifton Safley MD ELECTRONICALLY SIGNED 03/20/2013 17:58

## 2015-02-28 NOTE — H&P (Signed)
PATIENT NAME:  Carl Perry, [name] MR#:  [id number] DATE OF BIRTH:  06-21-1956  DATE OF ADMISSION:  03/05/2013  PRIMARY CARE PHYSICIAN:  Dr. Juel BurrowMasoud    REFERRING PHYSICIAN: Dr. Clemens Catholicagsdale  CHIEF COMPLAINT: Wound on right great toe.   HISTORY OF PRESENT ILLNESS: The patient is a 59 year old Caucasian male with a history of diabetes and other medical problems who comes in to the ER with a chief complaint of right foot ulcer. He reports that the symptoms started 3 to 4 days ago. He denies any puncture wound or stepping on some sharp objects. For the past 2 to 3 days the right great toe is red and swollen with purulent discharge. As it has been getting worse, he came into the ER. Also today his temp was at 101 degrees Fahrenheit at home. He sees podiatry, Dr. Orland Jarredroxler on a regular basis. His last visit was 4 months ago regarding foot care Denies any other complaints. No chest pain or shortness of breath. He admits that he uses CPAP every night. He is complaining of nasal stuffiness. In the ER wound cultures and blood cultures were obtained, and the patient has received vancomycin and IV ciprofloxacin. Hospitalist team is called to admit the patient regarding the diabetic foot ulcer.   PAST MEDICAL HISTORY:  1.  Diabetes mellitus type 2, non-insulin-dependent.  2.  Hypertension.  3.  Chronic obstructive pulmonary disease.  4.  Obesity.  5.  Obstructive sleep apnea on CPAP at bedtime.  6.  Chronic renal insufficiency.   PAST SURGICAL HISTORY: Hernia repair and wound debridement done by Dr. Orland Jarredroxler in the past.   ALLERGIE:  PENICILLIN.    PSYCHOSOCIAL HISTORY: Lives at home with family members. He used to smoke but quit smoking. Denies any alcohol or illicit drug usage.   FAMILY HISTORY: Diabetes runs in his family. Mother had history of cancer.   HOME MEDICATIONS: Tramadol 50 mg q.6 hours, potassium 1 tablet p.o. once daily, lovastatin 10 mg once daily, Janumet 50/100 one tablet p.o. 2 times a day,  glipizide 10 mg 2 times a day, furosemide 40 mg 3 times a day, enalapril 1 tablet p.o. once daily, diltiazem 60 mg once a day, baby aspirin 81 mg once daily, amitriptyline 25 mg 2 tablets once daily.   REVIEW OF SYSTEMS:   GENERAL: Complaining of fever. Denies any weakness, denies any pain. No weight loss or weight gain.  EYES: No blurry vision, glaucoma, cataracts.  EARS, NOSE, THROAT: No tinnitus, epistaxis, discharge, or sinus pain.  RESPIRATORY: Complaining of nasal stuffiness but denies any coughing. Has history of chronic obstructive pulmonary disease.  CARDIOVASCULAR: No chest pain, palpitations, syncope.  GASTROINTESTINAL: No nausea, vomiting, diarrhea. Denies any GERD. GENITOURINARY: No dysuria or hematuria.  ENDOCRINE: No polyuria, nocturia, thyroid problems.  HEMATOLOGIC AND LYMPHATIC: No anemia, easy bruising, bleeding.  INTEGUMENTARY: No acne or rashes but has big ulcer on the right great toe on the dorsal aspect of the foot, which is 2 x 2 cm in size and approximately 0.5 cm deep with a purulent discharge. Great toe is erythematous and edematous. It is nontender to touch.  SKIN: Warm to touch. Normal turgor.  EXTREMITIES: No edema. No clubbing. No cyanosis.  PSYCHIATRIC: Normal mood and affect.  MUSCULOSKELETAL: No joint effusion, tenderness or erythema. No pain in the neck, back, shoulder or knee pain.  NEUROLOGIC: No vertigo, ataxia, dementia, CVA, transient ischemic attack.  PSYCHIATRIC: No history of insomnia, ADD, OCD.   PHYSICAL EXAMINATION: VITAL SIGNS: Temperature 97.8  in the ER, pulse 93, respirations 20, blood pressure 159/75, pulse ox 98%.  GENERAL APPEARANCE: Not in acute distress. Moderately built and obese.  HEENT: Normocephalic, atraumatic. Pupils are equally reacting to light and accommodation. No scleral icterus. No conjunctival injection. Extraocular movements are intact. No sinus tenderness, but positive nasal stuffiness. No postnasal drip. No pharyngeal  exudate.  NECK: Supple. No JVD. No thyromegaly. No lymphadenopathy.  LUNGS: Bronchial breath sounds. No wheezing. No crackles.  CARDIAC: S1 and S2 normal. Regular rate and rhythm. No murmurs. No edema. No anterior chest wall tenderness on palpation. No accessory muscle use.  GASTROINTESTINAL: Soft, obese. Bowel sounds are positive in all four quadrants. Nontender, nondistended. No masses felt. No hepatosplenomegaly.  NEUROLOGIC: Awake, alert, oriented x3. Cranial nerves II through XII are grossly intact. Motor and sensory are grossly intact. Reflexes are 2+.  MUSCULOSKELETAL: No joint effusion, tenderness or erythema.  INTEGUMENTARY: No rashes. Warm to touch. Normal turgor. Positive ulcer on the right great toe. Right great toe is erythematous and edematous but nontender given the history of neuropathy. Ulcer is approximately 2 x 2 centimeters in size and 0.5 cm in depth with purulent discharge. No necrotic area is noticed.   EXTREMITIES: No edema. No cyanosis no clubbing.  PSYCHIATRIC: Normal mood and affect.   LABORATORY AND IMAGING STUDIES: Glucose 279. BUN 34, creatinine 2.74 which trended up from 1.77 in the past. Sodium 134, potassium 4.2, chloride 98, CO2 30, GFR 25, anion gap 6, serum osmolality is 286, calcium 9.1. LFTs, total protein 7.3, albumin 2.4, bilirubin total 0.6, alkaline phosphatase 91, AST 19, ALT 20, WBC 14.1, hemoglobin 10.2, hematocrit 30.4, platelet count is 294, MCV 87. Right great toe x-ray has revealed diffuse soft tissue swelling consistent with cellulitis. foreign bodies present. I do not see plain film evidence of osteomyelitis.   ASSESSMENT AND PLAN:  A 59 year old male coming to the ER with a chief complaint of right great toe wound with purulent discharge and fever as well as worsening of the renal function. He will be admitted with the following assessment and plan.  1.  Right great toe diabetic foot ulcer. Pan cultures were obtained. A podiatry consult is placed to  Dr. Orland Jarred.  The patient has received vancomycin IV in the ER. We will continue vancomycin and add Invanz to the regimen. Pharmacy to dose given the history of acute on chronic renal insufficiency. Wound cultures and blood cultures are done, which are pending at this time.  2.  Acute kidney injury on chronic kidney disease. The patient will be receiving IV fluids with close monitoring for symptoms and signs of fluid overload. We will avoid nephrotoxins. Nephrology consult is placed. We are holding metformin, hydrochlorothiazide, ACE inhibitor and Lasix for now which are his home medications.  3.  Chronic history of chronic obstructive pulmonary disease. Patient will be receiving DuoNeb nebulizer treatments and albuterol nebulizer treatments p.r.n.  4.  Hypertension. We will continue Cardizem and titrate medications as needed. ACE inhibitor and hydrochlorothiazide are on hold in view of acute kidney injury.  5.  Obesity. The patient will be benefited with diet and exercise once the patient is clinically stable.  6.  Obstructive sleep apnea. The patient will be on CPAP at bedtime.  7.  We will provide him gastrointestinal prophylaxis with ranitidine and deep vein thrombosis prophylaxis with heparin subcutaneous.   Patient is full code. Plan of care was discussed in detail with the patient. The patient verbalized understanding.   Total time spent  on admission: 50 minutes.    ____________________________ Ramonita Lab, MD ag:kb D: 03/05/2013 01:08:06 ET T: 03/05/2013 05:14:36 ET JOB#: [id number]  cc: Ramonita Lab, MD, <Dictator> Corky Downs, MD Rhona Raider. Troxler, DPM  Ramonita Lab MD ELECTRONICALLY SIGNED 03/09/2013 5:30

## 2015-02-28 NOTE — Discharge Summary (Signed)
PATIENT NAME:  Carl Perry, Carl Perry MR#:  [id number] DATE OF BIRTH:  11-05-1956  DATE OF ADMISSION:  03/05/2013 DATE OF DISCHARGE:  03/10/2013  ADMITTING DIAGNOSIS:  Right foot diabetic foot ulcer with possible osteomyelitis.   DISCHARGE DIAGNOSES:  1.  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 Orland Jarred, status post small bore right IJ IV line placement for IV antibiotic administration on 03/09/2013.  2.  Acute on chronic renal failure.  3.  Chronic obstructive pulmonary disease.  4.  Hypertension.  5.  Anemia of chronic kidney disease.  6.  Obstructive sleep apnea.  7.  Diabetes mellitus.  8.  Obesity.   DISCHARGE CONDITION:  Stable.   DISCHARGE MEDICATIONS:  The patient is to continue:  1.  Amitriptyline 50 mg by mouth at bedtime.  2.  Glipizide 10 mg by mouth twice daily.  3.  Diltiazem 60 mg by mouth once daily. 4.  Lovastatin 10 mg by mouth daily.  5.  Tramadol 50 mg by mouth 4 times daily.  6.  Bayer aspirin 81 mg by mouth daily.   7.  Acetamenophin oxycodone 325/5 mg 1 tablet every 6 hours as needed.  8.  Enalapril 5 mg by mouth once daily.  9.  Sitagliptin 50 mg by mouth twice daily.  10.  Ceftriaxone 2 grams every 24 hours for 14 days.  11.  Furosemide 40 mg by mouth once daily.  12.  Iron polysaccharide 150 mg by mouth once daily.  13.  Magnesium oxide 400 mg by mouth twice daily.  14.  Fluticasone nasal spray once a day.   The patient is not to take enalapril HCTZ combination or Janumet unless recommended by primary care physician.    DRESSING CARE:  The patient was advised to use a bandage in place unless wound becomes loose or soiled.  The patient was advised to wash the wound with wound wash in the room, apply Bactroban and gauze with gauze wrap every second day.  He is not to change his dressing until he is seen by Dr. Orland Jarred on Tuesday 03/13/2013 in his  office.   HOME OXYGEN:  None.   DIET:  2 gram salt, low fat, low cholesterol, carbohydrate-controlled diet, regular consistency.   ACTIVITY LIMITATIONS:  As tolerated.   FOLLOW-UP APPOINTMENTS:  With Dr. Leavy Cella in 1 day after discharge.  Dr. Orland Jarred on Tuesday on May 6 in Dr. Racheal Patches office at 11:00 a.m.    CONSULTANTS:  Dr. Orland Jarred, care management.  Dr. Mady Haagensen.   PROCEDURES:  The patient underwent operation, amputation of right hallux, IPJ and MAC anesthesia due to osteomyelitis of right great toe distal phalanx.  Estimated blood loss 50 mL.  No complications were noted immediately postoperatively.   HISTORY OF PRESENT ILLNESS:  The patient is a 59 year old Caucasian male with past medical history significant for history of diabetes who presented to the hospital on 03/05/2013 with complaints of wound around his right great toe.  Please refer to Dr. Rob Hickman admission note on 03/05/2013.  Apparently he was complaining of right foot ulcer.  He reported that symptoms started 3 or 4 days ago.  He denied any puncture or stepping on sharp objects.  Apparently his right great toe became red as well as swollen and had purulent discharge.  The patient had high fevers to 101 at home.  He decided to come to the Emergency Room for  further evaluation.  On arrival to the Emergency Room, the patient's vital signs that showed temperature of 97.8, pulse was 93, respiratory rate was 20, blood pressure 159/75, saturation was 98% on room air.  Physical exam was remarkable for ulcer on the right great toe.  It was erythematous and edematous, but nontender.  Ulcer was approximately 2 over 2 cm in size and 0.5 cm in depth with purulent discharge.  No necrotic areas were noted.    The patient's lab data done on the day of admission, 03/04/2013, revealed glucose of 279. The patient's BUN and creatinine were 34 and 2.74, sodium 134. Otherwise, BMP was unremarkable.  The patient's estimated GFR for non-African  American would be 25.  The patient's liver enzymes were remarkable only for albumin level of 2.4.  The patient's white blood cell count was elevated to 14.1, hemoglobin was 10.2, platelet count 294.  Erythrocyte sedimentation rate was markedly elevated at more than 140.  Absolute neutrophil count was also high at 11.0.  The patient's blood cultures taken on 03/04/2013 showed no growth.  The patient's wound culture done on 03/05/2013 revealed moderate growth of Streptococcus dysgalactiae species, also heavy growth of Staphylococcus aureus and heavy growth of bacteroides uniformis.  Initially the patient was started on broad-spectrum antibiotics and consultation with Dr. Orland Jarredroxler was obtained.  Dr. Orland Jarredroxler took patient to operating room after bedside debridement on 03/05/2013 did not clinically improve significantly.  The patient's toe was draining heavily and sinus tract opened up in the lateral portion of the toe with penetration to the bone and distal phalanx.  Dr. Orland Jarredroxler felt that the patient's cellulitis was a little better; however, he recommended to get MRI and rule out osteomyelitis.  The patient had initial great toe of right foot radiologic image on 03/04/2013 which showed diffuse soft tissue swelling consistent with cellulitis, but no osteomyelitis.  He underwent MRI study on 03/07/2013 which revealed soft tissue swelling involving the first phalanx and dorsal aspect of the foot concerning for cellulitis, but no focal fluid collection was noted.  It was also noted marrow edema in the first distal phalanx with slight cortical irregularity at the distalmost aspect of the first distal phalanx.  The appearance was concerning for osteomyelitis.  After this study, patient was taken to the operating room on 03/08/2013 and underwent a right hallux amputation at the IPJ.  The patient was continued on broad-spectrum antibiotics initially; however, as soon as his cultures came back for Staphylococcus aureus  methicillin insensitive as well as moderate growth of streptococcus dysgalactiae species and Bacteroides uniformis heavy growth, beta-lactamase positive, it was felt that patient would benefit from antibiotic therapy, one antibiotic, Rocephin.  Unfortunately we were not able to get Dr. Leavy CellaBlocker involved at this time; however, we felt that patient should follow up with him in the very nearest future.  The patient will follow up with Dr. Orland Jarredroxler on Tuesday, on 03/13/2013.  He will have home health nurses following him along and managing his IV antibiotic course.  The patient had right IJ placed for IV antibiotic administration on 03/09/2013.  He is to receive at least 2 weeks of antibiotic therapy since Dr. Orland Jarredroxler felt that he amputated his first phalanx and there was no involvement of his second phalanx at all.  The patient's antibiotics may need to be adjusted somewhat depending on final culture results.   In regards to acute on chronic renal failure, as mentioned above, the patient had creatinine elevation to 2.74, on day of  admission, 03/04/2013.  He received IV fluids.  With this, patient's kidney function somewhat improved.  By the day of discharge, 03/09/2013, patient's BUN and creatinine were 30 and 2.15.  It was felt that due to renal failure, acute on chronic, the patient is not a candidate for metformin use.  The patient should continue Januvia; however, metformin was discontinued.    For chronic obstructive pulmonary disease, hypertension, anemia, obstructive sleep apnea, or diabetes, we did not change any other medications; however, since the patient had some dehydration it was felt that patient may not be benefiting from higher doses of Lasix or HCTZ.  The patient should continue low dose of Enalapril; however, his HCTZ as well as high dose of furosemide were discontinued.  The patient is to continue low dose of 40 mg once daily dose of Lasix and HCTZ was stopped.  The patient's blood pressure  readings were quite good.  On day of discharge, the patient felt satisfactory.  Did not complain of any significant discomfort.  His vital signs were stable with temperature 98.3, pulse was 72 to 80, respiration rate was 20, blood pressure 136/75, saturation 93% to 95% on room air at rest.   Time spent 40 minutes.   ____________________________ Katharina Caper, MD rv:ea D: 03/10/2013 19:02:00 ET T: 03/11/2013 00:20:30 ET JOB#: [id number]  cc: Katharina Caper, MD, <Dictator> Rhona Raider. Troxler, DPM Rosalyn Gess. Blocker, MD   Dorrance Sellick MD ELECTRONICALLY SIGNED 03/22/2013 18:12

## 2015-02-28 NOTE — Consult Note (Signed)
PATIENT NAME:  Carl Perry, Carl Perry MR#:  [id number] DATE OF BIRTH:  [birth year]  DATE OF CONSULTATION:  03/05/2013  REFERRING PHYSICIAN:   CONSULTING PHYSICIAN:  Rhona Raider. Kalman Nylen, DPM  REASON FOR CONSULTATION:  Right great toe ulceration with a diabetic ulceration with infection.    HISTORY:  The patient was admitted through the Emergency Room early this morning. He is a patient known to me. He had a severe infection to his right foot a couple of years ago that required some incision and drainage and antibiotic care.   PAST MEDICAL HISTORY: Includes type 2 diabetes, hypertension, COPD, obesity, obstructive sleep apnea and has to use CPAP, and he has chronic renal insufficiency as well.   PAST SURGICAL HISTORY: Includes herniorrhaphy and also the I and D on his right foot by me.   SOCIAL HISTORY: Denies EtOH or drug use.  Has discontinued smoking.   FAMILY HISTORY: Includes diabetes and cancer with his mother.   HOME MEDICATIONS: 1.  Tramadol 5 mg q. 6 h. 2.  Potassium supplement daily. 3.  Lovastatin 10 mg daily. 4.  Janumet  50/100 one tablet 2 times a day. 5.  Glipizide 10 mg twice a day. 6.  Furosemide 40 mg 3 times a day. 7.  Enalapril 1 p.o. daily. 8.  Diltiazem 60 mg a day.  9.  Baby aspirin 81 mg a day. 10.  Amitriptyline 25 mg 2 tablets daily.  PHYSICAL EXAMINATION:   GENERAL:  He is  alert. He is well oriented. Does not appear to be febrile at this point and is able to respond to my answers well. He is a 59 year old Caucasian male.  VITAL SIGNS:  Temperature is 98.1, pulse 95, respirations 19, blood pressure 155/76 and pulse ox is 94.  LOWER EXTREMITY EXAM: Vascular and DP and PT pulses are palpable. He does have some edema to both feet and legs. Has more on the right with significant swelling to the right great toe. There is some redness and erythema at the toe as well as at the MTP joint level, as far as his skin is concerned.  DERMATOLOGIC: There is ulceration at the  plantar aspect of the right hallux. It is about 2 cm in diameter. It appears to be fairly deep with some central necrosis of tissues. There is also blister formation laterally and distally.  There was some tissue necrosis laterally, a small area, that has some drainage coming out of that. There does not appear to be a sinus tract; however, I cannot penetrate down to a deeper level.   DIAGNOSTICS:  X-ray is reviewed today. No clear evidence of osteomyelitis, but there is a density in soft tissue. I am not sure if that is an abscess or not, but it looks more solid than it does fluid.   CLINICAL IMPRESSION: Diabetic ulcer with cellulitis, right hallux.  Uncertain as to tissue and bone involvement level at this time frame.   TREATMENT PLAN: He is on ertapenem right now. He is getting 1 gram every 24 hours. We will have him stay on that for the time being.  I did get a culture of it today.  Nurse got a culture earlier, but I felt like with that heavy drainage coming laterally we should get a culture of that area also. The patient is also on vancomycin injection for the time being until we get some idea of culture patterns back. I also think it would probably be a good idea to get a MRI  on his right great toe at some point. I may evaluate him again tomorrow and take another look at it at that time and see if there is bone involvement, but I think we are going to have to get a little more information regarding this after we see how he responds over the next 24 hours to debridement, drainage and the antibiotics that he is on.   We will get the cultures and I will follow him tomorrow to see how well he is doing with it. ____________________________ Rhona RaiderMatthew G. Makayla Lanter, DPM mgt:sb D: 03/05/2013 12:58:24 ET T: 03/05/2013 13:10:56 ET JOB#: [id number]  cc: Rhona RaiderMatthew G. Jocelynne Duquette, DPM, <Dictator> Epimenio SarinMATTHEW G Amorita Vanrossum MD ELECTRONICALLY SIGNED 03/20/2013 17:58

## 2015-02-28 NOTE — Op Note (Signed)
PATIENT NAME:  Carl Perry, Carl Perry MR#:  [id number] DATE OF BIRTH:  Apr 21, 1956  DATE OF PROCEDURE:  04/13/2013  PREOPERATIVE DIAGNOSIS:  Non-healed amputation site, right hallux.   POSTOPERATIVE DIAGNOSIS: Non-healed amputation site, right hallux.   PROCEDURES:  1.  Delayed primary closure of non-healed amputation site to right hallux. 2.  Non-healed wound.   SURGEON: Rhona RaiderMatthew G. Presleigh Feldstein, DPM  ASSISTANT: None.   HISTORY OF PRESENT ILLNESS: The patient had an amputation to his left hallux secondary to osteomyelitis and distal phalanx approximately 3 weeks ago which showed gradual healing, but early stresses on the area caused dehiscence and gapping that did not appear to be healing well and was still open at 3 weeks. We elected to bring him back to the OR to clean up the wound and to freshen up the edges to see if we can get delayed primary closure across that area.   DESCRIPTION OF PROCEDURE: The patient was brought to the OR and placed on the OR table in the supine position. The patient was then prepped and draped in the usual sterile manner. Attention was then directed to the right hallux. No tourniquet was used. There were no sutures left in the region. I removed these in the office several days ago.  The wound was easily opened up and expanded. A VersaJet was used to clean up the anterior of the wound. The edges were debrided with sharp 15 blade dissection. There was also an ulcerative area on the plantar aspect of the flap that I did an elliptical incision around that and closed it primarily as well. Once the edges were fresh and bleeding and the anterior was appropriately debrided and cleaned out the area was irrigated. No evidence of infection was noted. At this point, the wound was closed with a combination of deep sutures of 3-0 Vicryl and also superficially. The skin was closed with 3-0 nylon simple interrupted sutures. Everything coaptated nicely and came together nicely. A sterile compressive  dressing with lots of padding was placed across the area consisting of 4 x 4's, Kling, ABD and Kerlix. The patient has an OrthoWedge shoe that he needs to use appropriately to keep weight off this area.  I will see him back in my office next week for follow up. He left the OR to go to the recovery room with vital signs stable and neurovascular status intact.  ____________________________ Rhona RaiderMatthew G. Keinan Brouillet, DPM mgt:sb D: 04/13/2013 11:49:57 ET T: 04/13/2013 12:21:26 ET JOB#: [id number]  cc: Rhona RaiderMatthew G. Joedy Eickhoff, DPM, <Dictator> Epimenio SarinMATTHEW G Meghann Landing MD ELECTRONICALLY SIGNED 05/02/2013 15:23

## 2015-03-01 NOTE — Consult Note (Signed)
Brief Consult Note: Patient was seen by consultant.   Consult note dictated.   Comments: 1 Acute rhabdomyolysis, most likely statin related in pt with renal insufficiency, improving 2 elevated esr, most likely related to nephrotic syndrome rather than an inflammatory myopathy 3 acute and chronic renal failure 4 diabetic neuropathy      no need for muscle biopsy.  Electronic Signatures: Leeanne Mannan., Eugene Gavia (MD)  (Signed 20-Jan-15 17:55)  Authored: Brief Consult Note   Last Updated: 20-Jan-15 17:55 by Leeanne Mannan., Eugene Gavia (MD)

## 2015-03-01 NOTE — H&P (Signed)
PATIENT NAME:  Carl Perry, Carl Perry MR#:  [id number] DATE OF BIRTH:  [birth year]  DATE OF ADMISSION:  01/22/2014  REFERRING PHYSICIAN: Dr. Harvest Dark.   PRIMARY CARE PHYSICIAN: Used to be Dr. Lavera Guise, but currently, the patient is switching physicians.   NEPHROLOGIST: Dr. Juleen China.   CHIEF COMPLAINT: Lower extremity weakness and pain.   HISTORY OF PRESENT ILLNESS: This is a 59 year-old male well known history of hypertension, morbid obesity, diabetes, obstructive sleep apnea on CPAP. The patient with chronic kidney disease, baseline creatinine around 2.5. The patient presents with complaints of bilateral lower extremity weakness. The patient was recently in the hospital, discharged on February 3rd with diagnosis of UTI. He was in subacute rehab for 2 weeks. Has been home for 2 weeks. The patient reports has been having generalized muscle ache over the last week and progressive weakness, mainly in the lower extremities. He could  not hold himself up so he fell on the floor. Denies any head trauma. The patient remained on the floor for more than 15 minutes. Could not stand up. His wife could not help to raise him up, so they called EMS. Upon presentation to the ED, the patient had blood work done which was significant for elevated total CK at 3576. The patient is known to have history of rhabdomyolysis , was  in the hospital in January of this year, with rhabdomyolysis due to statin, where his initial total CK was 62,000. The patient was supposed to be off statin, but it seems he is back on it on his home medications. He is unclear how he got back on it, but he reports when he went back home, he started to take his old meds and it appears he was taking back his lovastatin then. The patient's creatinine was 1.62 which appears to be around his baseline or even slightly better. He denies any chest pain, any shortness of breath, any edema, any swelling, any dysuria or polyuria, any nausea, vomiting, abdominal  pain or coffee-ground emesis   PAST MEDICAL HISTORY:  1. Hypertension.  2. Obstructive sleep apnea, on CPAP.  3. COPD.   4. Type 2 diabetes, insulin requiring.  5. Chronic kidney disease. Baseline creatinine around 2.5.  6. History of rhabdomyolysis due to statin.   SOCIAL HISTORY: Remote history of tobacco abuse. No alcohol. No drugs.   FAMILY HISTORY: Significant for diabetes.   ALLERGIES:  1. PENICILLIN.  2. APPEARS HE HAS STATIN ALLERGY CAUSING RHABDOMYOLYSIS.   HOME MEDICATIONS:  1. Aspirin 81 mg daily.  2. Tramadol 50 mg every 6 hours as needed.  3. Tamsulosin 0.4 mg oral daily.  4. Verapamil 120 mg oral 2 times a day.  5. Amitriptyline 25 mg oral daily.  6. Venlafaxine oral 2 times.  7. Levemir 50 units at bedtime.  8. Victoza 1.8 units subcutaneous daily.  8. Gemfibrozil 600 mg oral 2 times a day.  9. Lovastatin 20 mg oral 2 times a day.  10. Coreg 6.25 mg oral 2 times a day.  11. Lasix 40 mg oral 2 times a day.   REVIEW OF SYSTEMS:  CONSTITUTIONAL: The patient denies fever, chills. Complains of fatigue, weakness.  EYES: Denies blurry vision, double vision, inflammation, glaucoma.  ENT: Denies tinnitus, ear pain, hearing loss, epistaxis or discharge.  RESPIRATORY: Denies any cough, wheezing, hemoptysis.  CARDIOVASCULAR: Denies chest pain, edema, arrhythmia, palpitation, syncope.  GASTROINTESTINAL: Denies nausea, vomiting, diarrhea, abdominal pain, hematemesis, melena.  GENITOURINARY: Denies dysuria, hematuria, renal colic.  ENDOCRINE: Denies polyuria,  polydipsia, heat or cold intolerance.  HEMATOLOGY: Denies anemia, easy bruising or bleeding diathesis.  INTEGUMENTARY: Denies acne, rash or skin lesions.  MUSCULOSKELETAL: The patient complains of generalized muscle ache. Denies any swelling, gout or cramps.  NEUROLOGIC: Denies any history of CVA, TIA, headache, ataxia, vertigo. Complains of generalized weakness, mainly in the lower extremities.  PSYCHIATRIC: Denies  any anxiety, insomnia, substance abuse, alcohol abuse.   PHYSICAL EXAMINATION:  VITAL SIGNS: Temperature 98.1, pulse 66, respiratory rate 22, blood pressure 156/82, saturating 95% on room air.  GENERAL: Morbidly obese male, looks comfortable in bed, in no apparent distress.  HEENT: Head atraumatic, normocephalic. Pupils equal, reactive to light. Pink conjunctivae. Anicteric sclerae. Moist oral mucosa.  NECK: Supple. No thyromegaly. No JVD.  CHEST: Good air entry bilaterally. No wheezing, rales or rhonchi.  CARDIOVASCULAR: S1, S2 heard. No rubs, murmurs or gallops.  ABDOMEN: Obese, soft, nontender and nondistended. Bowel sounds present.  EXTREMITIES: No edema. No clubbing. No cyanosis. Pedal pulses and radial pulses +2 bilaterally.  PSYCHIATRIC: Appropriate affect. Awake, alert x 3. Intact judgment and insight.  NEUROLOGIC: Cranial nerves grossly intact. Motor 5 out of 5 in all extremities.  EXTREMITIES: Lower extremity exam: The patient was not able to raise his extremity against gravity but otherwise, he was able to bend his knees all the way and push will full strength against my hand. Sensation: The patient had decreased sensation in his bilateral feet. Reports this is chronic from his diabetes. The patient has right great toe partial amputation.  LYMPHATIC: No cervical lymphadenopathy.  SKIN: Warm and dry. Normal skin turgor.   PERTINENT LABORATORIES: Glucose 166, BUN 28, creatinine 1.62, sodium 137, potassium 3.8, chloride 105, CO2 27. ALT 53, AST 172, alk phos 83. Troponin 0.2. CK-MB 26.6. Total CK 3576. White blood cells 10.9, hematocrit 35.1, platelets 409.   EKG showing normal sinus rhythm at 67 beats per minute. No ST or T wave abnormalities.   CT head without contrast showing no acute abnormality.   Chest, portable, x-ray showing no acute disease.    ASSESSMENT AND PLAN:  1. Rhabdomyolysis: This is most likely statin induced. Unclear why the patient is back on his statin. The  patient was instructed not to take back his statin. Will hold it. Will hydrate with intravenous fluids. Will monitor closely. His kidney function appears to be at baseline, but given the patient's symptomatic complaints of lower extremity weakness, will consult neurology service for further evaluation.  2. Chronic kidney disease: Appears to be at baseline. Will monitor closely. Will continue hydration. Will hold Lasix.  3. Hypertension: Blood pressure is acceptable. Continue with home meds.  4. Insulin-dependent diabetes mellitus: Continue with Levemir at a lower dose. We will decrease from 50 to 35. Will add insulin sliding scale.  5. Obstructive sleep apnea: Continue with CPAP.  6. History of chronic obstructive pulmonary disease: The patient currently has no wheezing.  7. Hyperlipidemia: Will hold the patient's statin medication due to his rhabdomyolysis.  8. Deep vein thrombosis prophylaxis: Subcutaneous heparin.  9. Gastrointestinal prophylaxis: On proton pump inhibitor.   CODE STATUS: The patient is FULL CODE.   TOTAL TIME SPENT ON ADMISSION AND PATIENT CARE: 55 minutes.   ____________________________ Albertine Patricia, MD dse:gb D: 01/23/2014 00:45:28 ET T: 01/23/2014 01:14:49 ET JOB#: [id number]  cc: Albertine Patricia, MD, <Dictator> DAWOOD Graciela Husbands MD ELECTRONICALLY SIGNED 01/28/2014 2:00

## 2015-03-01 NOTE — H&P (Signed)
PATIENT NAME:  Carl Perry, Carl Perry MR#:  [id number] DATE OF BIRTH:  [birth year]  DATE OF ADMISSION:  09/17/2014  PRIMARY CARE PHYSICIAN: Onnie BoerKrichna F. Carlynn PurlSowles, MD, at [facility].  REFERRING PHYSICIAN: Aneta MinsPhillip A. Scotty CourtStafford, MD  CHIEF COMPLAINT: Syncopal episode.   HISTORY OF PRESENTING ILLNESS: A 59 year old male who has a past history of chronic diastolic congestive heart failure, lower extremity edema, hypertension, morbid obesity, chronic obstructive pulmonary disease, type 2 diabetes, retinopathy and diabetic neuropathy.   CHIEF COMPLAINT: The patient came to the Emergency Room and stated that for the last 3 days he has left ear pain and some clear discharge from there. Today morning, he was walking and he suddenly felt very dizzy. Everything was rotating around him. He also had a headache for the last 2-3 days without any help with Tylenol. No fever, and denies any vision or hearing changes. When he felt dizzy this morning, he just held onto the wall and some things were around him and his family members were around him, so they just held him. He was almost falling down, but with support of family, they were able to hold him and prevent the fall. He never lost his consciousness, and never felt chest pain or shortness of breath with this episode, and so came to he came to the Emergency Room with this.  The Emergency Room physician consulted me for other management of this issue.   REVIEW OF SYSTEMS:  CONSTITUTIONAL: Negative for fever. Positive for fatigue and generalized weakness. No weight loss or weight again.  EYES: No blurring or double vision, discharge or redness.  EARS, NOSE, THROAT: No tinnitus, ear pain or hearing loss.  RESPIRATORY: No cough, wheezing, hemoptysis, or shortness of breath.  CARDIOVASCULAR: No chest pain, orthopnea, edema, arrhythmia or palpitations.  GASTROINTESTINAL: No nausea, vomiting, diarrhea, abdominal pain. Jeni to  GENITOURINARY: No dysuria, hematuria, or  increased frequency.  ENDOCRINE: No heat or cold intolerance. No excessive sweating.  SKIN: No acne, rashes, or lesions.  MUSCULOSKELETAL: No pain or swelling in the joints.  NEUROLOGICAL: No numbness, weakness, or tremor, but had an episode of dizziness and vertigo.   PAST MEDICAL HISTORY:  1.  Chronic diastolic CHF.  2.  Chronic lower extremity edema.  3.  Hypertension.  4.  Morbid obesity.  5.  Chronic obstructive pulmonary disease.  6.  Chronic respiratory failure on 2 L oxygen.  7.  Obstructive sleep apnea on CPAP.  8.  Type 2 diabetes.  9.  Chronic kidney disease, stage 4.  10.  Diabetic retinopathy. 11.  Diabetic neuropathy.  12.  History of hernia repair surgery.   SOCIAL HISTORY: He is married and lives with his wife. He has limited mobility. He used to smoke in the past, but stopped 3 years ago. He does not drink alcohol. No illicit drug use.   FAMILY HISTORY: Positive for chronic kidney disease and breast cancer.   HOME MEDICATION:  1.  Victoza 1.2 units subcutaneous once a day.  2.  Verapamil 24 hour once a day.  3.  Venlafaxine 100 mg oral once a day.  4.  Torsemide 100 mg oral 2 times a day.  5.  Tamsulosin 0.4 mg oral once a day.  6.  Spiriva inhalation once a day.  7.  NovoLog subcutaneous as needed for sliding scale coverage.  8.  Levemir 50 units subcutaneous once a day at bedtime.  9.  Potassium chloride 20 mEq 2 tablets 2 times a day.  10.  Glipizide 10  mg oral tablet as needed.  11.  Gemfibrozil 600 mg oral 2 times a day.  12.  Carvedilol 6.25 mg oral 2 times a day.  13.  Baclofen 20 mg oral once a day.  14.  Aspirin enteric-coated, 81 mg once a day.  15.  Amitriptyline 25 mg oral 2 tablets once.   VITALS IN EMERGENCY ROOM: Temperature 98.5, pulse is 70, respirations 18, blood pressure 114/74, and pulse oximetry is 99% on room air.  PHYSICAL EXAMINATION:   GENERAL: The patient is fully alert and oriented, morbidly obese, and does not appear in any  acute distress.  HEENT: Head and neck atraumatic. Conjunctivae pink. Oral mucosa moist. There is slight tenderness anterior to his left ear; no localized infection on external ear or ear cartilages, and no visible discharge, but some crusting present in the ear canal. No mastoid tenderness.  NECK: Supple. No JVD.  RESPIRATORY: Bilateral equal and clear air entry.  CARDIOVASCULAR: S1, S2 present. Regular. No murmur.  ABDOMEN: Soft, nontender. Bowel sounds present. No organomegaly.   SKIN: No rashes.  LEGS: Mild edema present.  JOINTS: No swelling or tenderness.  NEUROLOGICAL: Power 5/5 in the right upper limb, right lower limb, left upper limb, and left lower limb. No tremor or rigidity. Follows commands. No tremors.  PSYCHIATRIC: Does not appear in any acute psychiatric illness at this time.   IMPORTANT LABORATORY RESULTS: CT scan of the head without contrast is done; no acute abnormality. Glucose 295, BUN 98, creatinine 3.0, sodium 137, potassium 3.1, chloride 93, CO2 of 34, and calcium is 9.1. Troponin 0.09. WBC 9.6, hemoglobin 13.9, platelet count 368, 0000 and MCV of 83. Urinalysis is grossly negative.   ASSESSMENT AND PLAN: The patient is a 59 year old male with a past medical history of multiple medical issues, came with some ear pain for the last 3 days and headache. He had a syncopal episode today.  1.  Syncopal episode. We will admit to telemetry and follow serial troponins, and we will get physical therapy evaluation. Most likely this looks like an event from his ear infection, likely viral. On CT head, there is no abnormality, so it might be viral middle ear infection. We will give oral Keflex and continue monitoring.  2.  Chronic kidney disease. His creatinine is at baseline, around 3; it was always slightly on the higher side, so we will just continue for that.  3.  Elevated troponin. This might be because of his chronic renal failure. EKG shows no changes, and the patient does not have  any chest pain or palpitations. We will stop the heparin drip which is ordered by the ER physician.  4.  Diabetes. We will put her on sliding scale coverage insulin and will likely decrease the dose of Levemir, as long-acting usually at home.  5.  Chronic obstructive pulmonary disease. Currently, he is not in exacerbation. We will just continue his baseline inhalers.  6.  Hypertension. We will continue his home medication.  7.  Code Status: Full Code. The patient's says in the past he was DNR; he signed the papers, but he would like to change it to Full Code at this time, and his power of attorney for health decisions will be his wife, in any adverse event.  TOTAL TIME SPENT ON THIS ADMISSION: 50 minutes.    ____________________________ Hope Pigeon Elisabeth Pigeon, MD vgv:MT D: 09/17/2014 15:49:51 ET T: 09/17/2014 16:36:32 ET JOB#: [id number]  cc: Hope Pigeon. Elisabeth Pigeon, MD, <Dictator> Altamese Dilling MD ELECTRONICALLY SIGNED  10/01/2014 19:12 

## 2015-03-01 NOTE — H&P (Signed)
PATIENT NAME:  Carl Perry, Carl Perry MR#:  [id number] DATE OF BIRTH:  [birth year]  DATE OF ADMISSION:  05/30/2014  PRIMARY CARE PROVIDER: Dr. Juel Burrow.   NEPHROLOGY: Dr. Wynelle Link.   CHIEF COMPLAINT: Shortness of breath, abdominal distention.   HISTORY OF PRESENT ILLNESS: A 59 year old Caucasian male patient with history of chronic diastolic CHF, chronic lower extremity edema, diabetes, hypertension and CKD stage IV and chronic respiratory failure, presents to the Emergency Room complaining of worsening shortness of breath and abdominal distention. The patient has noticed some weight gain of about 5 pounds over the last week. He has continued taking his torsemide and metolazone regularly.   Initially, on initial contact with EMS, his saturations were in the 80s on 2 liters of oxygen. He was increased to 6 liters of oxygen, later needed Ventimask in the ER, but presently he is down to 5 liters of oxygen. He is afebrile.   He mentions that he continues to have orthopnea, which is a chronic problem for him, and his lower extremity swelling is no worse, but his abdominal distention has worsened over the past week with 5 pound weight gain. He does not have any sputum. He continues to cough. No chest pain. No abdominal pain. No nausea or vomiting.   He follows with Dr. Wynelle Link. He was told that he will need dialysis sometime soon, but the patient is adamant that he does not want dialysis unless absolutely necessary. He also is DNR/DNI.   The patient is a little drowsy while talking to me, but is alert and oriented x 3.   PAST MEDICAL HISTORY: 1.  Chronic diastolic CHF.  2.  Chronic lower extremity edema.  3.  Hypertension.  4.  Morbid obesity.  5.  COPD.  6.  Chronic respiratory failure on 2 liters oxygen.  7.  Obstructive sleep apnea on CPAP.  8.  Type 2 diabetes mellitus.  9.  CKD stage IV.  10.  History of rhabdomyolysis secondary to statins.  11.  Diabetic retinopathy.  12.  Diabetic neuropathy.   13.  History of hernia repair.   SOCIAL HISTORY: The patient is married, lives with his wife. His daughter is at bedside. Limited mobility. He used to smoke heavily, but quit in 2014. Does not drink alcohol. No illicit drugs.   CODE STATUS: DNR/DNI.   FAMILY HISTORY: Positive for CKD and breast cancer.   ALLERGIES: STATINS AND PENICILLIN.   REVIEW OF SYSTEMS: CONSTITUTIONAL: Complains of fatigue, weight gain.  EYES: No blurred vision, pain or redness. ENT:  No tinnitus, ear pain, hearing loss.  RESPIRATORY: He has cough, no sputum, and shortness of breath. Has COPD.  CARDIOVASCULAR: No chest pain. Does have orthopnea, edema, which are chronic.  GASTROINTESTINAL: No nausea, vomiting, diarrhea, abdominal pain.  GENITOURINARY: No dysuria, hematuria, frequency.  ENDOCRINE: No polyuria, nocturia, thyroid problems.  HEMATOLOGIC AND LYMPHATIC: No anemia, easy bruising, bleeding.  INTEGUMENTARY: No acne, rash, lesion.  MUSCULOSKELETAL: Has arthritis.  NEUROLOGIC: No focal numbness, weakness.  PSYCHIATRIC: No anxiety or depression.   HOME MEDICATIONS: 1.  Amitriptyline 25 mg 2 tablets daily.  2.  Aspirin 81 mg daily.  3.  Coreg 6.25 mg oral 2 times a day.  4.  Glipizide 10 mg oral 2 times a day.  5.  Potassium chloride 20 mEq 2 tablets oral 2 times a day.  6.  Levemir 15 units subcutaneous once a day at bedtime.  7.  Metolazone 5 mg oral once a day.  8.  Omega-3 fatty acids  2 capsules oral 2 times a day.  9.  Tamsulosin 0.4 mg daily.  10.  Torsemide 100 mg oral twice daily.  11.  Tramadol 50 mg oral 4 times a day as needed for pain.  12.  Venlafaxine 75 mg oral once a day.  13.  Verapamil 120 mg oral 2 times a day.  14.  Victoza 18 mg/3 mL 1.2 units subcutaneous once a day.   PHYSICAL EXAMINATION: VITAL SIGNS: Shows temperature 97.8, pulse of 63, respirations 26, blood pressure 120/80, saturating 100% on 6 liters oxygen.  GENERAL: A morbidly obese, Caucasian male patient sitting  at the side of the bed, in significant respiratory distress with 1 to 2 word conversational dyspnea.   PSYCHIATRIC:  He is drowsy, but oriented to person, place and time.  HEENT: Atraumatic, normocephalic. Oral mucosa moist and pink. Pupils bilaterally equal and reactive to light.  NECK: Supple. No thyromegaly. No palpable lymph nodes. Trachea midline. No carotid bruit or JVD.  CARDIOVASCULAR: S1, S2, without any murmurs. Peripheral pulses are difficult to palpate with edema in lower extremities. Has 3+ edema.  RESPIRATORY: Increased work of breathing, using accessory muscles. Good air entry. No crackles heard.  GASTROINTESTINAL: Soft abdomen, but distended. Has healed scar from prior surgery.  GENITOURINARY: No CVA tenderness.  SKIN: Warm and dry. No petechiae, rash, ulcers.  MUSCULOSKELETAL: No joint swelling, redness of the large joints.  NEUROLOGICAL: Motor strength 5/5 in upper and lower extremities.  LYMPHATICS:  No cervical lymphadenopathy.   LABORATORY STUDIES: Show glucose 251. BNP of 1617 with BUN 108, creatinine 4.53, sodium 127, potassium 4.4, with chloride 90, bicarb 27. Troponin less than 0.02.   WBC 15.4, hemoglobin 10.2, platelets of 466.   Venous blood gas shows pH of 7.23 with pCO2 of 67, pO2 of 50.   EKG shows normal sinus rhythm, nonspecific ST-T wave changes. No ST elevation.   Chest x-ray, portable, shows left lower lobe pneumonia and a small left pleural effusion and does have mild pulmonary venous congestion.   ASSESSMENT AND PLAN: 1.  Acute on chronic respiratory failure secondary to left lower lobe pneumonia and pulmonary edema. The patient also is drowsy. Has mild encephalopathy from his hypercarbia. We will put him on a BiPAP.  2.  Left lower lobe pneumonia. He does have elevated white count of 15.4, but no sepsis. We will start him on ceftriaxone and azithromycin. Get sputum and blood cultures.  3. Acute on chronic diastolic congestive heart failure. The patient  does have weight gain with pulmonary edema on chest x-ray. We will put him on a Lasix drip at this point, but I suspect his creatinine would worsen from this. I have discussed case with Dr. Cherylann Ratel with nephrology. The patient may need dialysis, depending on further progress.  4. Acute encephalopathy with drowsiness secondary to hypercarbia  patient is on BiPAP.  5.  Acute renal failure over chronic kidney disease secondary to cardiorenal syndrome. The patient will likely end up needing dialysis sometime soon, but presently on Lasix drip and this needs to be monitored.  6.  Insulin-dependent diabetes mellitus. Continue home dose of Lantus, put him on sliding scale insulin.  7.  Hyponatremia; this is hypovolemic hyponatremia secondary to his heart failure.  8.  Anemia of chronic disease, stable.  9.  Deep vein thrombosis prophylaxis with heparin.   CODE STATUS: DNR/DNI as per my discussion with the patient with family at bedside.   Time spent in critical care time was 45 minutes.  ____________________________ Molinda BailiffSrikar R. Arn Mcomber, MD srs:dmm D: 05/30/2014 17:38:07 ET T: 05/30/2014 18:03:47 ET JOB#: [id number]  cc: Wardell HeathSrikar R. Joy Reiger, MD, <Dictator> Corky DownsJaved Masoud, MD Laverda SorensonSarath C. Kolluru, MD Orie FishermanSRIKAR R Dearl Rudden MD ELECTRONICALLY SIGNED 06/24/2014 14:08

## 2015-03-01 NOTE — H&P (Signed)
PATIENT NAME:  Carl Perry, Carl Perry MR#:  [id number] DATE OF BIRTH:  Mar 29, 1956  DATE OF ADMISSION:  11/23/2013  REFERRING PHYSICIAN: Dr. Kerman Passey.  PRIMARY CARE PHYSICIAN: Dr. Lavera Guise.   NEPHROLOGY: Dr. Juleen China.   CHIEF COMPLAINT: Leg pain.   HISTORY OF PRESENT ILLNESS: A 59 year old gentleman with past medical history of chronic kidney disease with a creatinine of around 2 to 2.5; type 2 diabetes, insulin-requiring; COPD; obstructive sleep apnea on CPAP therapy; hypertension, presenting with bilateral leg pain described as a 7-day duration of leg pain. He describes this as diffuse, posterior leg pain, bilateral, sharp in quality, 8 to 9 out of 10 in intensity, nonradiating, no relieving factors, gradually worsening with movement. He had associated leg weakness. Has proximal muscle weakness, describing form going from sitting to standing. He stated that he was actually unable to get up from sitting on the toilet earlier today to standing position. Had to get onto the floor. From the floor position, he was still unable to stand up and required extra help.   He also complains of upper extremity weakness, mainly when trying to get out of bed and is trying to use his upper extremities to push himself off the bed and found it extremely difficult.   He also mentions decreased p.o. intake but denies any nausea, vomiting, diarrhea.   Of note, he recently had increased medications about 2 weeks ago, stating that he believes it was Lasix, but no other recent medication changes including his statin therapy.   Currently complaining of leg pain, though improved after receiving morphine in the Emergency Department.   REVIEW OF SYSTEMS:  CONSTITUTIONAL: Denies fever. Positive for fatigue, weakness, and pain as described above.  EYES: Denies blurred vision, double vision, eye pain.  ENT: Denies tinnitus, ear pain, hearing loss.  RESPIRATORY: Denies cough, wheeze, shortness of breath.  CARDIOVASCULAR: Denies  chest pain, palpitations, edema.  GASTROINTESTINAL: Denies nausea, vomiting, diarrhea, abdominal pain.  GENITOURINARY: Denies dysuria, hematuria, or increased urinary frequency.  ENDOCRINE: Denies nocturia or thyroid problems.  HEMATOLOGIC AND LYMPHATIC: Denies easy bruising or bleeding.  SKIN: Denies rashes or lesions.  MUSCULOSKELETAL: Denies pain in neck, back, shoulder, knees, hips, or arthritic symptoms.  NEUROLOGIC: Denies paralysis, paresthesias.  PSYCHIATRIC: Denies anxiety or depressive symptoms.   Otherwise, full review of systems performed by me is negative.   PAST MEDICAL HISTORY: Hypertension; obstructive sleep apnea on home CPAP therapy; COPD; type 2 diabetes, insulin-requiring; chronic kidney disease with baseline creatinine around 2.5.   SOCIAL HISTORY: Remote history of tobacco use. No alcohol use. No drug use.   FAMILY HISTORY: Positive for diabetes.   ALLERGIES: PENICILLIN.   HOME MEDICATIONS: Include aspirin 81 mg p.o. daily, tramadol 50 mg p.o. 3 times daily, verapamil 120 mg p.o. daily, amitriptyline 50 mg p.o. at bedtime, NPH 15 units injection twice daily, Victoza 1.2 units injection at bedtime, lovastatin 10 mg p.o. b.i.d., Coreg 10 mg p.o. b.i.d., Lasix 40 mg p.o. b.i.d., multivitamin 1 tab p.o. daily, hydrocodone 5 mg every four to 6 hours as needed for pain.   PHYSICAL EXAMINATION:  VITAL SIGNS: Temperature 98.2, heart rate 59, respirations 20, blood pressure 145/71. Weight 136.1 kg, BMI 43.1.  GENERAL: A well-nourished, well-developed, Caucasian gentleman, currently in no acute distress.  HEAD: Normocephalic, atraumatic.  EYES: Pupils equal, round and reactive to light. Extraocular muscles are intact. No scleral icterus.  MOUTH: Moist mucosal membranes. Dentition intact. No abscess noted.  ENT: Clear without exudates. No external lesions.  NECK: Supple. No  thyromegaly. No nodules. No JVD.  PULMONARY: Clear to auscultation bilaterally. No wheezes, rubs or  rhonchi. No use of accessory muscles. Good respiratory effort.  CHEST: Nontender to palpation.  CARDIOVASCULAR: S1, S2, regular rate and rhythm. No murmurs, rubs, or gallops. No edema. Pedal pulses 2+ bilaterally.  GASTROINTESTINAL: Soft, nontender, nondistended. No masses, obese. Positive bowel sounds. No hepatosplenomegaly.  MUSCULOSKELETAL: No swelling, clubbing or edema. Proximal flexion, bilateral lower extremities, is limited secondary to weakness. There is full range of motion in the remainder of his extremities.  NEUROLOGIC: Cranial nerves II through XII intact. No gross motor deficits. Sensation intact. Reflexes intact.  SKIN: No ulcerations, lesions, rash or cyanosis. Skin warm and dry. Turgor is intact. He does have mild skin changes of bilateral lower extremity consistent with venostasis. He does not have any heliotrope rashes or gottren patches, mechanic hands PSYCHIATRIC: Mood and affect within normal limits.   NEUROLOGIC: Alert and oriented x3. Insight and judgment intact.   LABORATORY DATA: EKG revealing sinus bradycardia, heart rate 57, first-degree AV block with a PR interval of 268. There are no ST or T-wave abnormalities.   LABORATORY DATA: Sodium 130, potassium 5.1, chloride 97, bicarb 22, BUN 101, creatinine 6.08, glucose 197. Total protein 8.2, albumin 2.6, bilirubin 0.4, alk phos 102, AST 1076, ALT 194. CK [id number]. CK-MB 28.3. Troponin 0.42. WBC 10.7, hemoglobin 12.3, platelets 341. Urinalysis is pending at this time.   ASSESSMENT AND PLAN: A 59 year old Caucasian gentleman with history of chronic kidney disease presenting with bilateral leg pain.  1.  Rhabdomyolysis without clear etiology. We will hold ACE inhibitor and statin. We will check TSH, ESR and ANA as well as chest x-ray given mild concern for polymyositis given history and physical with proximal muscle weakness and elevated laboratory findings. Provide IV fluid hydration. Trend his CK as well as basic metabolic panel  for renal function. We will consult nephrology. Dr. Juleen China is his primary nephrologist.  2.  Acute kidney injury on chronic kidney disease, secondary to #1. Avoid further nephrotoxic agents including nonsteroidal antiinflammatories, ACE inhibitors, and Victoza. Provide treatment course as described above. IV fluid hydration, monitoring of renal function and urine output. No acute indication for hemodialysis at this time but may be a necessity in the future if he does not respond.  3.  Type 2 diabetes. Hold p.o. agents. Add insulin sliding scale as well as q.6-hour Accu-Cheks. We will hold his basal insulin therapy for now given his acute kidney injury and that he will retain insulin. Continue to follow and reassess as required.  4.  Hypertension. Hold ACE inhibitors. Continue his Cardizem and Coreg.  5.  Obstructive sleep apnea. Continue home CPAP therapy.  6.  VTE prophylatic subcutaneous.   CODE STATUS: FULL CODE.   TIME SPENT: 55 minutes.   ____________________________ Aaron Mose. Omarrion Carmer, MD dkh:np D: 11/23/2013 21:02:13 ET T: 11/23/2013 21:30:44 ET JOB#: [id number]  cc: Aaron Mose. Harbor Paster, MD, <Dictator> Lindsie Simar Woodfin Ganja MD ELECTRONICALLY SIGNED 11/24/2013 21:44

## 2015-03-01 NOTE — H&P (Signed)
PATIENT NAME:  Carl Perry, Carl Perry MR#:  [id number] DATE OF BIRTH:  09/07/1956  DATE OF ADMISSION:  12/04/2013  PRIMARY CARE PHYSICIAN:  Dr. Juel Burrow  CHIEF COMPLAINT: Anasarca, scrotal swelling, urinary retention, and shortness of breath.   HISTORY OF PRESENT ILLNESS: A 59 year old Caucasian male patient with history of:  1. COPD.  2. Type 2 diabetes mellitus.  3. Hypertension.  4. Recent acute renal failure over CKD and rhabdomyolysis.  Presents to the hospital with complaints of urinary retention, scrotal swelling, generalized anasarca, and worsening shortness of breath. The patient also feels extremely weak, unable to walk.   The patient was discharged yesterday after being treated for acute renal failure over CKD secondary to rhabdomyolysis. The patient had a urine output of 2300 mL. Nephrology recommended patient follow up with them as outpatient for renal biopsy, and if any worsening, starting of dialysis if needed. The patient did improve well, but after going home he felt extremely weak, unable to make any urine, worsening edema, scrotal swelling, shortness of breath, and presents to the Emergency Room. Here, he has been found to have bilateral effusions, urinary retention with about 1900 mL of urine out after placing a Foley, initially 1200 immediately.   PAST MEDICAL HISTORY: 1.  Hypertension.  2.  Insulin-dependent diabetes mellitus.  3.  Obstructive sleep apnea, on CPAP.  4.  COPD.   5.  CKD IV.  6.  Recent rhabdomyolysis.  7.  Hyperlipidemia.  8.  Morbid obesity.  9.  Past tobacco abuse.   SOCIAL HISTORY: The patient smoked in the past, quit one year back. No alcohol. No drug use.   FAMILY HISTORY: Positive for diabetes.   ALLERGIES: PENICILLIN.   REVIEW OF SYSTEMS: CONSTITUTIONAL: Complains of fatigue and weakness.  EYES: No blurred vision, pain, redness.  EARS, NOSE, THROAT: No tinnitus, ear pain, hearing loss.  RESPIRATORY: Has cough, shortness of breath, COPD.   CARDIOVASCULAR: No chest pain. Does have anasarca and edema.  GASTROINTESTINAL: No nausea, vomiting, diarrhea, abdominal pain.  GENITOURINARY: Does have dysuria, urinary retention, scrotal swelling.  ENDOCRINE: No polyuria, nocturia, thyroid problems.  HEMATOLOGIC AND LYMPHATIC: Has anemia of chronic disease.  INTEGUMENTARY: No acne, rash. Has lesions on lower extremities, with excoriation.  MUSCULOSKELETAL: Has back pain.  NEUROLOGIC: No focal numbness, has generalized weakness.  PSYCHIATRIC: No anxiety or depression.   HOME MEDICATIONS INCLUDE:  1.  Amitriptyline 25 mg 2 tablets daily.  2.  Aspirin 81 mg daily. 3.  Coreg 6.25 mg oral 2 times a day.  4.  Levemir 20 units subcutaneous at bedtime.  5.  Venlafaxine 37.5 mg oral 2 times a day.  6.  Verapamil 120 mg oral 2 times a day.   PHYSICAL EXAMINATION: VITAL SIGNS: Temperature 97.4, pulse of 60, blood pressure 151/77, saturation 97% on room air.  GENERAL:  A morbidly obese Caucasian male patient lying in bed, in distress secondary to scrotal swelling.  PSYCHIATRIC: Alert and oriented x 3. HEENT: Normocephalic. Oral mucosa moist and pink. Pallor positive. No icterus. Pupils bilaterally equal and react to light. No oral ulcers or thrush. External ears and nose normal.  NECK: Supple. No thyromegaly or palpable lymph nodes. Trachea midline. No carotid bruit, JVD.  CARDIOVASCULAR: S1, S2, without any murmurs. Peripheral pulses difficult to palpate. There is  3+ edema.  RESPIRATORY: Has bilateral crackles, decreased air entry. No wheezes.  GASTROINTESTINAL: Soft abdomen, distended, with scar from prior surgery. No hepatosplenomegaly palpable.  GENITOURINARY: No CVA tenderness. Has significant swelling of his  scrotum, with a Foley in place. Unable to see his penis in the swollen scrotum. Has a yeast infection in his groin.  MUSCULOSKELETAL: No joint swelling, redness, effusion of the large joints.  SKIN: Has stasis changes and oozing from  his legs from the swelling. Also has some small ulcers in lower extremities.  NEUROLOGICAL: Motor strength 5/5 in upper extremities, about 4/5 in lower extremities. Sensation is intact all over.  LYMPHATIC: No cervical lymphadenopathy.   LAB STUDIES SHOW:  Glucose of 167, BNP of 1993, BUN 59, creatinine of 5.22, sodium 133, potassium 3.4, GFR of 11, magnesium 1.6.  CK of 336. Troponin less than 0.02. WBC 8.6, hemoglobin 9.2, platelets of 352. Urinalysis shows no bacteria.   Chest x-ray shows bilateral pleural effusions. EKG shows normal sinus rhythm with first-degree AV block, with PR of 230. Lower extremity Doppler showed no DVT.    ASSESSMENT AND PLAN: 1.  Acute on chronic diastolic congestive heart failure secondary to fluid overload from his kidney disease. The patient definitely had urinary retention, was unable to pee, with 1200 coming out. Will give him a dose of Lasix IV now, and put him on IV Lasix b.i.d. The patient seems to be improving with his creatinine, but has severe anasarca, and with the Lasix, need to monitor his creatinine. Chest x-ray does mention some infiltrates, but patient does not have any fever, no white cell count. Likely atelectasis. Will not put him on any antibiotics at this time.   2.  Acute renal failure with chronic kidney disease. His creatinine is improved from yesterday to 5.22. Monitor now that he is on Lasix. Consult Nephrology. His rhabdomyolysis from last admission has resolved. His CK today is 336.   3.  Hypertension. Seems to be fairly controlled, but with his PR interval prolonged, will hold the beta blocker he is on.   4.  Anemia of chronic kidney disease, stable.   5. Urinary retention secondary to severe scrotal swelling. Should improve as his edema improves. Continue the Foley catheter at this time.   6.  Deep vein thrombosis prophylaxis with heparin.   CODE STATUS: FULL CODE.   Time spent today on this case was 50 minutes.      ____________________________ Molinda BailiffSrikar R. Lavarr President, MD srs:mr D: 12/04/2013 19:53:14 ET T: 12/04/2013 20:31:11 ET JOB#: [id number]  cc: Wardell HeathSrikar R. Noeh Sparacino, MD, <Dictator> Corky DownsJaved Masoud, MD Laverda SorensonSarath C. Kolluru, MD   Orie FishermanSRIKAR R Lucienne Sawyers MD ELECTRONICALLY SIGNED 12/05/2013 16:07

## 2015-03-01 NOTE — Discharge Summary (Signed)
PATIENT NAME:  Carl Perry, Carl Perry MR#:  [id number] DATE OF BIRTH:  August 02, 1956  DATE OF ADMISSION:  11/23/2013 DATE OF DISCHARGE:    Please refer to the interim summary dictated on 01/23 by Dr. Abel Presto.   ADMISSION DIAGNOSES: 1.  Rhabdomyolysis.  2.  Acute on chronic renal failure.   DISCHARGE DIAGNOSES: 1.  Acute on chronic renal failure thought to be secondary to rhabdomyolysis with possible underlying pathology.  2.  Rhabdomyolysis initially thought to be due to statin medication and not myositis.  3.  Hypotension, resolved.  4.  History of hypertension.  5.  History of diabetes.  6.  Elevated troponin in the setting of acute rhabdomyolysis.  7.  Hyperlipidemia.   CONSULTATIONS:  1.  Dr. Anthonette Legato. 2.  Dr. Precious Reel.  3.  Dr. Saralyn Pilar.   LABORATORY, DIAGNOSTIC AND RADIOLOGICAL DATA AT DISCHARGE:  Sodium 136, potassium 3.4, chloride 92, bicarb 37, BUN 64, creatinine 5.40, glucose is 136. A 2-D echocardiogram showed an ejection fraction of 60% to 65% with mild left ventricular hypertrophy, mild to moderate left ventricular internal cavity size.   HOSPITAL COURSE:  A 59 year old male who presented on January 16th with diffuse posterior leg pain and found to have rhabdomyolysis and acute renal failure on chronic renal failure. For further details, please refer to the H and P.  1.  Acute on chronic renal failure. The patient had a very elevated creatinine on admission. This was likely secondary to rhabdomyolysis and questionable underlying another etiology. His creatinine trended down with IV fluids. We stopped IV fluids and his creatinine continues to decrease. Nephrology was consulted and followed up closely. The patient may need a biopsy but the patient and the nephrologist felt that this could be done as outpatient if needed. As mentioned, his creatinine has improved. He has very good urine output. He will follow up with  Dr. Juleen China on Wednesday.   2.  Hypotension,  resolved with IV fluids.  3.  Rhabdomyolysis.  The patient presented with weakness and pain. Dr. Precious Reel was consulted.  His ESR was greater than 140. He had a CK of greater than 50,000. Dr. Precious Reel felt that this was probably related to his statin medication. These medications have been stopped. His ANA is negative, aldolase level is elevated. Rheumatology did not think the patient needed a biopsy at this time. The patient will need followup in 2 weeks after discharge.  4.  Diabetes.  The patient was continued on his outpatient medications with some medical adjustments due to the renal failure.  5.  Elevated troponin. Appreciate cardiology consult. His echo showed normal ejection fraction, no wall motion abnormalities. This is likely in the setting of acute rhabdomyolysis and not acute coronary syndrome.  6.  Hyperlipidemia. Statin is on hold due to rhabdo.  7.  Hypertension. The patient was continued on his outpatient medications. The patient will need followup for his blood pressure as well.   DISCHARGE MEDICATIONS: 1.  Amitriptyline 25 mg 2 tablets daily.  2.  Aspirin 81 mg daily.  3.  Coreg 6.25 b.i.d.  4.  Verapamil 120 mg b.i.d.  5.  Effexor 37.5 mg b.i.d.  6.  Levemir 28 units at bedtime.  7.  The patient will stop taking Lasix, lovastatin, gemfibrozil, spironolactone, NSAIDs and any medications that affect the kidney.    DISCHARGE HOME HEALTH: With physical therapy, nurse and nurse aide for assessment and gait.    DISCHARGE DIET: Low sodium, ADA diet.  DISCHARGE ACTIVITY: As tolerated.   DISCHARGE FOLLOWUP: On Wednesday with Dr. Juleen China, in 1 to 2 weeks with Dr. Precious Reel.   TIME SPENT: 35 minutes. The patient is medically stable for discharge.   ____________________________ Hanifah Royse P. Benjie Karvonen, MD spm:cs D: 12/03/2013 13:22:00 ET T: 12/03/2013 14:46:59 ET JOB#: [id number]  cc: Cletis Athens, MD Mamie Levers, MD Dr. Precious Reel Deontra Pereyra P. Benjie Karvonen, MD,  <Dictator>  Donell Beers Baltazar Pekala MD ELECTRONICALLY SIGNED 12/04/2013 15:23

## 2015-03-01 NOTE — Discharge Summary (Signed)
PATIENT NAME:  Carl Perry, Carl Perry MR#:  [id number] DATE OF BIRTH:  1956-03-30  DATE OF ADMISSION:  09/17/2014 DATE OF DISCHARGE:  09/18/2014   PRESENTING COMPLAINT: Presyncopal episode with left earache.   DISCHARGE DIAGNOSES:  1.  Presyncope resolved.  2.  Due to dizziness from left ear, mild otitis media appears more viral symptoms.  3.  Chronic obstructive pulmonary disease.  4.  Chronic kidney disease stage III-IV.  5.  Type 2 diabetes.  6.  Hypertension.   CODE STATUS: FULL CODE.   MEDICATIONS:  1.  Aspirin 81 mg daily.  2.  Tamsulosin 0.4 mg at bedtime.  3.  Carvedilol 6.25 b.i.d.  4.  Amitriptyline 25 mg 2 tablets once a day at bedtime.  5.  Klor-Con 20 mEq 2 tablets twice a day.  6.  Venlafaxine 75 mg extended release p.o. daily.  7.  Victoza 1.2 units subcutaneous daily.  8.  Spiriva 2.5 mcg inhalation daily.  9.  Baclofen 20 mg 1 tablet at bedtime as needed.  10. Glipizide 10 mg daily as needed.  11. NovoLog per sliding scale.  12. Gemfibrozil 600 mg 1 tablet b.i.d.  13. Verapamil 240 mg 1 capsule daily.  14. Levemir dose was decreased to 30 units at bedtime.  15. Torsemide 100 mg b.i.d.; start taking from 09/19/2014. .  16. Keflex 250 b.i.d.   DIET:  Low-sodium, renal carbohydrate-controlled diet.  FOLLOW-UP:   1.  With your primary care physician, 2.  Follow up with nephrology as before.   BRIEF SUMMARY OF HOSPITAL COURSE: Mr. Carl Perry is a 59 year old Caucasian gentleman with multiple medical problems, came in with some ear pain, for 3 days and a headache. He had a near syncopal episode today. He was admitted with:  1.  Dizziness and presyncopal episode. Remains in sinus rhythm, feels a lot better, could be due to some fluid behind the left tympanic membrane; p.o. Keflex for possible left otitis media, inflammation; hemodynamically stable.  2.  Chronic kidney disease.  Creatinine is stable around 3. Advised to follow up nephrology as outpatient.  3.  Elevated  troponin, likely because of his chronic renal failure. EKG shows no changes. The patient does not have any chest pain.  4.  Type 2 diabetes. Resume home medications. Insulin was decreased. His Levemir was decreased to 30 units as his sugars were very well controlled, to avoid hypoglycemic episode.  5.  Chronic obstructive pulmonary disease, not in exacerbation. Continued inhalers. 6.  Hypertension. Continue home medications.  7.  Patient did not want physical therapy evaluation. Overall was doing better and was discharged to home in stable condition   TIME SPENT: 40 minutes.     ____________________________ Wylie HailSona A. Allena KatzPatel, MD sap:DT D: 09/19/2014 07:06:08 ET T: 09/19/2014 10:29:58 ET JOB#: [id number]  cc: Jodene Polyak A. Allena KatzPatel, MD, <Dictator> Willow OraSONA A Byren Pankow MD ELECTRONICALLY SIGNED 10/08/2014 9:17

## 2015-03-01 NOTE — Consult Note (Signed)
PATIENT NAME:  Carl Perry, Carl Perry MR#:  [id number] DATE OF BIRTH:  1956-05-12  DATE OF CONSULTATION:  11/27/2013  CONSULTING PHYSICIAN:  Marcina MillardAlexander Jerzy Roepke, MD  PRIMARY CARE PHYSICIAN:  Dr. Juel BurrowMasoud.  NEPHROLOGY:  Dr. Wynelle LinkKolluru.  CHIEF COMPLAINT: Leg discomfort.   REASON FOR CONSULTATION: Consultation requested for evaluation of elevated troponin.   HISTORY OF PRESENT ILLNESS: The patient is a 59 year old gentleman with history of chronic kidney disease, COPD and sleep apnea. [facility] with 7 day history of diffuse bilateral leg pain and weakness. The patient is unable to stand from a sitting position. He had difficulty getting out of bed. Admission labs were notable for BUN and creatinine of 101 and 6.08. Total CPK was 62,971 and an MB fraction of 28. Troponin was borderline elevated at 0.42.  The patient denies chest pain.   PAST MEDICAL HISTORY: 1.  Sleep apnea on CPAP.  2.  Hypertension.  3.  COPD.  4.  Type 2 diabetes.  5.  Chronic kidney disease.   MEDICATIONS: Aspirin 81 mg daily, tramadol 50 mg t.i.d., verapamil 120 mg daily, amitriptyline 50 mg at bedtime, NPH insulin 15 units b.i.d., Victoza 12 units at bedtime, lovastatin 10 mg b.i.d., carvedilol 10 mg b.i.d., Lasix 40 mg b.i.d., multivitamin 1 daily, hydrocodone 5 mg q.4-6h. p.r.n.   SOCIAL HISTORY: The patient is married and resides with his wife.   FAMILY HISTORY: No immediate family history for coronary artery disease or myocardial infarction.     REVIEW OF SYSTEMS:   CONSTITUTIONAL: No fevers or chills.  EYES: No blurry vision.  EARS: No hearing loss.  RESPIRATORY: The patient has shortness of breath due to underlying COPD and sleep apnea.  CARDIOVASCULAR: The patient denies chest pain.  GASTROINTESTINAL: No nausea, vomiting or diarrhea.  GENITOURINARY: No dysuria or hematuria.  ENDOCRINE: No polyuria or polydipsia.  HEMATOLOGICAL: No easy bruising or bleeding.  MUSCULOSKELETAL: No  arthralgias or myalgias.  NEUROLOGICAL: No focal muscle weakness or numbness.  PSYCHOLOGICAL: No depression or anxiety.   PHYSICAL EXAMINATION: VITAL SIGNS: Blood pressure 144/74, pulse 76, respirations 20, temperature 97.8, pulse ox 96%.  HEENT: Pupils equal and reactive to light and accommodation.  NECK: Supple without thyromegaly.  LUNGS: Clear.  CARDIOVASCULAR: Normal JVP. Normal PMI. Regular rate and rhythm. Normal S1, S2. No appreciable gallop, murmur or rub.  ABDOMEN: Soft, nontender without hepatosplenomegaly.  EXTREMITIES: No cyanosis, clubbing or edema. Pulses were intact bilaterally.  MUSCULOSKELETAL: Normal muscle tone.  NEUROLOGIC: The patient is alert and oriented x 3. Motor and sensory are both grossly intact.   IMPRESSION: A 59 year old gentleman who presents with leg pain and weakness, markedly elevated CPK consistent with rhabdomyolysis and acute renal failure. The patient denies chest pain. Doubt seriously that borderline elevated troponin is due to acute coronary syndrome and much more likely due to demand supply ischemia.   RECOMMENDATIONS: 1.  Agree with overall current therapy.  2.  Would defer full dose anticoagulation.  3.  Would defer further cardiac diagnostics at this time.    ____________________________ Marcina MillardAlexander Dekendrick Uzelac, MD ap:dmm D: 11/27/2013 09:15:00 ET T: 11/27/2013 09:34:56 ET JOB#: [id number]  cc: Marcina MillardAlexander Mikaylee Arseneau, MD, <Dictator> Marcina MillardALEXANDER Lamoyne Palencia MD ELECTRONICALLY SIGNED 12/25/2013 12:30

## 2015-03-01 NOTE — Discharge Summary (Signed)
PATIENT NAME:  Carl Perry, Carl Perry MR#:  [id number] DATE OF BIRTH:  [birth year]  DATE OF ADMISSION:  01/22/2014 DATE OF DISCHARGE:  01/25/2014   ADMITTING PHYSICIAN: Albertine Patricia, MD  DISCHARGING PHYSICIAN: Gladstone Lighter, MD  PRIMARY CARE PHYSICIAN: Cletis Athens, MD  PRIMARY NEPHROLOGIST: Mamie Levers, MD  PRIMARY ENDOCRINOLOGIST: Earleen Newport Eddie Dibbles, [place]: Neurology consultation by Dr. Valora Corporal.   DISCHARGE DIAGNOSES: 1.  Statin-induced inflammatory myositis.  2.  Rhabdomyolysis.  3.  Chronic kidney disease, stage III, with baseline creatinine around 2.5.  4.  Obstructive sleep apnea, on CPAP.  5.  Chronic obstructive pulmonary disease.  6.  Hypertension.  7.  Type 2 diabetes mellitus requiring insulin.   DISCHARGE HOME MEDICATIONS:   1.  Aspirin 81 mg p.o. daily.  2.  Flomax 0.4 mg p.o. at bedtime.  3.  Venlafaxine 37.5 mg p.o. b.i.d.  4.  Coreg 6.25 mg p.o. b.i.d.  5.  Verapamil 120 mg p.o. b.i.d.  6.  Tramadol 50 mg q.6 hours p.r.n. for pain.  7.  Amitriptyline 50 mg at bedtime.  8.  Levemir 35 units subcutaneous at bedtime.  9.  Prednisone 60 mg p.o. daily for 1 week until seen by neurology.   DISCHARGE DIET: Low-sodium, low-fat diet.   DISCHARGE ACTIVITY: As tolerated.    FOLLOWUP INSTRUCTIONS: 1.  No statins at all. 2.  Physical therapy.  3.  Neurology followup in 2 weeks for EMG and nerve conduction studies.  4.  CPK level testing in 2 days and also in 5 days from now.  5.  Follow up with nephrology in 3 to 4 weeks.   LABORATORY AND IMAGING STUDIES PRIOR TO DISCHARGE: CK from January 25, 2014, morning is 9586 and CK level from January 25, 2014, evening is pending. Sodium 140, potassium 3.7, chloride 110, bicarbonate 25, BUN 26, creatinine 1.42, glucose 113, and calcium of 8.4. Urinalysis showing 3+ blood greater than 500 mg/dL of protein and only 2 RBCs. His CRP is elevated at 31. ESR is also elevated at 80. ANA panel is  negative. Serum myoglobin is elevated at 7431. WBC 10.3, hemoglobin 11.3, hematocrit 33.4, platelet count 385. Chest x-ray showing clear lung fields. No acute cardiopulmonary disease. Troponins were elevated at 0.2 on admission.   BRIEF HOSPITAL COURSE: Carl Perry is a 59 year old obese Caucasian male with past medical history significant for hypertension, sleep apnea, COPD, diabetes, and history of CKD, who had an admission for rhabdomyolysis induced by statin in January 2015, comes back secondary to bilateral lower extremity weakness and was noted to be in rhabdomyolysis again.  1.  Statin-induced inflammatory myositis causing rhabdomyolysis. The patient was stopped of statin in January and then unfortunately for some reason it seems like he was still taking this medicine, as it is still listed in his home medications. Either way, it was stopped and added to his allergy list. His CK was in the 3000 range but gradually going up and is now in the 9500 range. Repeat level is pending. He is being restarted on IV fluids, and there is also a concern if statins triggered an inflammatory myositis, as his ESR and CRP are elevated. ANA panel is negative. Neurology has been following for myositis. Also, Dr. Scharlene Gloss opinion was taken. He did not think muscle biopsy is needed at this time. Prednisone has been started tentatively for 1 week, and the patient is advised to follow up with neurology as an outpatient in 1 week for  EMG and nerve conduction studies. Once the CK level starts plateauing or trending down, the patient can be discharged to rehab as recommended by physical therapy.  2.  CKD, stage III to IV, baseline creatinine of 2.5. With IV fluids, creatinine is improving. It is at 1.4 and chest x-ray is clear. No dyspnea or pulmonary edema noted, but cautious IV fluids.  3.  COPD and obstructive sleep apnea. The patient uses CPAP at bedtime and has been otherwise stable.   4.  Diabetes mellitus. Follows with Dr.  Adella Hare. His sugars have been on the lower side, so his insulin regimen has been decreased from 50 units to 35 units at bedtime, and also his Victoza is currently on hold.  5.  His course has been otherwise uneventful in the hospital.   DISCHARGE CONDITION: Stable.   DISCHARGE DISPOSITION: To short-term rehab at [facility].  TIME SPENT ON DISCHARGE: 45 minutes.   ____________________________ Gladstone Lighter, MD rk:jcm D: 01/25/2014 15:53:06 ET T: 01/25/2014 16:55:40 ET JOB#: [id number]  cc: Gladstone Lighter, MD, <Dictator> Mamie Levers, MD Cletis Athens, MD Bhakti B. Eddie Dibbles, MD Gladstone Lighter MD ELECTRONICALLY SIGNED 01/31/2014 16:06

## 2015-03-01 NOTE — Consult Note (Signed)
PATIENT NAME:  Carl Perry, Carl Perry MR#:  [id number] DATE OF BIRTH:  [birth year]  DATE OF CONSULTATION:  10/16/2014  CONSULTING PHYSICIAN:  Kelton Pillar. Sheryle Hail, Carl Perry.  REASON FOR CONSULTATION: Acute on chronic diastolic dysfunction, congestive heart failure, chronic kidney disease with diabetes, hypertension, hyperlipidemia and elevated troponin.   CHIEF COMPLAINT: "I got short of breath."   HISTORY OF PRESENT ILLNESS:  This is a 59 year old male with known severe chronic kidney disease stage IV, diabetes with complications of cardiovascular disease as well as chronic kidney disease, mixed hyperlipidemia, essential hypertension, who has not been on dialysis, but has been using Lasix, who is now had significant shortness of breath with physical activity, relieved by rest, increasing in frequency and/or intensity over the last several days and a recent hospitalization for which he has had some treatment.  He also has hypokalemia, likely secondary to diuretic use and now comes in with shortness of breath for worsening at this time and some disorientation, although the patient does feel better at this time. He does have obstructive sleep apnea for which he has been on appropriate CPAP machine and currently has a normal sinus rhythm with nonspecific ST changes, but an elevated troponin of 0.06 most consistent with current illness and demand ischemia rather than acute coronary syndrome. Currently, he feels slightly better.   REVIEW OF SYSTEMS: The remainder review of systems negative for vision change, ringing in the ears, hearing loss, cough, congestion, heartburn, nausea, vomiting, diarrhea, bloody stools, stomach pain, extremity pain, leg weakness, cramping of the buttocks, known blood clots, headaches, blackouts, dizzy spells, nosebleeds, congestion, trouble swallowing, frequent urination, urination at night, muscle weakness, numbness, anxiety, depression, skin lesions or skin rashes.   PAST MEDICAL HISTORY:  Chronic kidney disease, diabetes, hyperlipidemia, hypertension, hypokalemia, sleep apnea.   FAMILY HISTORY: Multiple family members with hypertension and cardiovascular disease.   SOCIAL HISTORY: Currently denies alcohol or tobacco use.   ALLERGIES: As listed.   MEDICATIONS: As listed.   PHYSICAL EXAMINATION: VITAL SIGNS: Blood pressure is 128/86 bilaterally, heart rate is 72 and slightly irregular.  GENERAL: He is a well-appearing large male in no acute distress.  HEENT: No icterus, thyromegaly, ulcers, hemorrhage, or xanthelasma.  CARDIOVASCULAR: Regular rate and rhythm. Normal S1 and S2. PMI is diffuse. No apparent murmurs.  Carotid upstroke normal. Cannot assess jugular venous pressures.  LUNGS: Have bibasilar crackles with expiratory wheezes.  ABDOMEN: Soft, nontender. Increased abdominal girth. Cannot assess hepatosplenomegaly or masses or aorta.  EXTREMITIES: Showed 2+ radial, no dorsal pedal and femoral artery assessment. There is 1+ lower extremity edema. No cyanosis, clubbing or ulcers.  NEUROLOGIC: He is oriented to time, place, and person, with normal mood and affect.   ASSESSMENT: A 59 year old male with acute on chronic diastolic dysfunction, congestive heart failure, likely secondary to essential hypertension, mixed hyperlipidemia, diabetes with complication with chronic kidney disease stage IV, hypokalemia and obstructive sleep apnea with elevated troponin, most consistent with demand ischemia rather than acute coronary syndrome needing further treatment options.   RECOMMENDATIONS: 1.  Continue Lasix for acute on chronic diastolic dysfunction heart failure, watching for worsening chronic kidney disease issues.  2.  No further intervention of minimal elevation of troponin consistent with demand ischemia.  3.  Continue beta blocker and/or amlodipine if necessary for heart failure and/or hypertension control.  4.  Lipid management with high-intensity cholesterol therapy. 5.   Diabetes with a goal hemoglobin A1c below 7.  6.  Continue aggressive sleep apnea treatment with CPAP machine, which may  contribute to diastolic dysfunction.  7.  Begin ambulation and follow for any further significant symptoms and reasons for other diagnostic testing and/or change in medication management.   ____________________________ Carl BlinksBruce J. Jettie Mannor, Carl Perry bjk:DT D: 10/16/2014 11:49:14 ET T: 10/16/2014 12:18:16 ET JOB#: [id number]  cc: Carl BlinksBruce J. Gautham Hewins, Carl Perry, <Dictator> Carl Perry ELECTRONICALLY SIGNED 10/18/2014 7:54

## 2015-03-01 NOTE — Discharge Summary (Signed)
PATIENT NAME:  Carl Perry, Carl Perry MR#:  [id number] DATE OF BIRTH:  22-Jun-1956  DATE OF ADMISSION:  05/30/2014 DATE OF DISCHARGE:  06/01/2014  PRIMARY DOCTOR:  Dr. Juel BurrowMasoud.  PRIMARY NEPHROLOGIST:  Dr. Wynelle LinkKolluru.  REASON FOR ADMISSION:  Shortness of breath and abdominal distention.   HISTORY OF PRESENT ILLNESS: The patient is a 59 year old male patient with chronic back to the cocktail. He has chronic lower extremity edema, diabetes, hypertension, CKD stage IV and chronic respiratory failure, who came in because of trouble breathing, abdominal distention,  weight gain of 5 pounds over the last week. The patient admitted to the hospitalist service for acute on chronic diastolic heart failure in the context of CKD stage III.  The patient also had an oxygen saturation of about 80% on 2 liters and we increased oxygen to 6 liters, and patient admitted to the hospitalist service. Look at history and physical for full details. The patient was started on a high dose torsemide 100 mg b.i.d. and metolazone 5 mg at home, and the patient BNP was 1617 on admission, and ABG showed a pCO2 of 67, PSA 1.23, and pO2 to 50 on admission. A chest x-ray also showed left lower lobe pneumonia.   1.  Acute on chronic respiratory failure due to pneumonia and   . The patient started on IV Rocephin and Zithromax for pneumonia and the patient's sputum cultures and blood cultures were negative. He was continued on oxygen.  Discharging home with Zithromax.   PATIENT'S ALLERGIES MENTIONED TO PENICILLIN   But he tolerated the Rocephin well, so we will continue azithromycin and prescription was sent to the pharmacy.  He will have azithromycin 5 mg daily for 5 days.    2.  Acute on chronic diastolic heart failure with weight gain, pulmonary edema, and elevated BNP. The patient was started on Lasix drip seen by nephrology Dr. Mady HaagensenMunsoor Lateef. The patient was on Lasix for about 3 days. The patient says that he feels much better today. He was  seen by Dr. Thedore MinsSingh and he suggested that the patient can go home. The patient follows up with Dr. Wynelle LinkKolluru and has advanced chronic kidney disease. Baseline creatinine was 2.87 on June 2.  The patient responded well to a Lasix drip, and today he feels much better and he wants to go home, so the patient will go home and resume his torsemide and metolazone and see nephrology in the clinic in a week. The patient creatinine is 3.59 today. After seeing that patient said that patient can go home.   3.  Metabolic encephalopathy secondary to elevated CO2. The patient's CO2 was 67 on admission, improved.  The patient's metabolic encephalopathy resolved. Advised to continue  CPAP at night. He is alert and oriented.   4.  Diabetes mellitus type 2.  He is on Victoza and Lantus and sliding scale coverage. Sugars have been in the 300s, so patient can resume his Lantus and sliding scale and Victoza.    5.  CODE STATUS: DO NOT RESUSCITATE and DO NOT INTUBATE.   The patient has chronic respiratory failure on 2 liters of oxygen at home, and the patient's oxygen saturation have improved and today they are 92% on 2 liters.  Other vitals, heart rate is 82, blood pressure 127/65, saturations 92% on 2 liters, and temperature 98.2. The patient is, therefore, discharged.    TIME SPENT ON DISCHARGE PREPARATION: More than 30 minutes.    ____________________________ Katha HammingSnehalatha Dejuan Elman, MD sk:ts D: 06/01/2014 10:11:02 ET T:  06/01/2014 13:17:45 ET JOB#: [id number]  cc: Katha Hamming, MD, <Dictator> Katha Hamming MD ELECTRONICALLY SIGNED 06/20/2014 17:41

## 2015-03-01 NOTE — Discharge Summary (Signed)
PATIENT NAME:  Carl Perry, Carl Perry MR#:  [id number] DATE OF BIRTH:  1956-05-29  DATE OF ADMISSION:  10/15/2014 DATE OF DISCHARGE:  10/17/2014  DISCHARGE DIAGNOSES: 1.  Acute encephalopathy due to urinary tract infection and hypokalemia.  2.  Weakness from hypokalemia.  3.  Hypertension.  4.  Diabetes mellitus.  5.  Chronic kidney disease stage IV.  6.  Morbid obesity.  7.  Chronic diastolic congestive heart failure.  8.  Sleep apnea.   DISCHARGE MEDICATIONS: 1.  Aspirin 81 mg daily.  2.  Tamsulosin 0.4 mg oral once a day.  3.  Coreg 6.25 mg oral 2 times a day.  4.  Amitriptyline 25 mg oral 2 tablets once a day.  5.  Venlafaxine 75 mg daily.  6.  Spiriva inhaled once a day.  7.  Baclofen 20 mg oral once a day as needed for pain.  8.  Glipizide 10 mg oral daily as needed.  9.  Gemfibrozil 600 mg oral 2 times a day.  10.  Verapamil extended release 240 mg daily.  11.  Torsemide 100 mg oral 2 times a day.  12.  Levemir 45 units subcutaneous once a day.  13.  NovoLog sliding scale.  14.  Metolazone 5 mg oral once a day.  15.  Victoza 1.6 mg subcutaneous once a day.  16.  Potassium chloride 20 mEq oral 2 times a day.  17.  Acetaminophen- Oxycodone 325/5 one tablet 3 times a day as needed.  18.  Lactulose 15 mL oral once a day.  19.  Ciprofloxacin 250 mg oral 2 times a day.     DISCHARGE INSTRUCTIONS: Home health with physical therapy and nurse. Low-sodium, low-fat, low-cholesterol diet. Activity as tolerated. Follow up with primary care physician, Dr.   Wynelle LinkKolluru in 1 to 2 weeks.   CT of the head without contrast is stable, CT of the head without any changes.   Chest x-ray, portable, shows no acute cardiopulmonary abnormalities.     ADMITTING HISTORY AND PHYSICAL: Please see detailed H and P dictated by Dr. Winona LegatoVaickute. In brief, a 59 year old Caucasian male patient was brought in by family due to some dizziness and confusion. The patient was significantly improved in the Emergency Room,  but as he was still a little confused, had severe hypokalemia, elevated ammonia, was admitted to the hospitalist service.    HOSPITAL COURSE: 1.  Acute encephalopathy secondary to urinary tract infection and hypokalemia. The patient's potassium was aggressively replaced through IV and oral with which his potassium on day of discharge is 3.5. UTI is being treated with Rocephin which will be changed to oral ciprofloxacin and his acute encephalopathy is resolved. The patient's ammonia was elevated, but only mildly at 35, initially put on lactulose, which will be continued daily. I do not think this was cause of his confusion as he does not have any liver problems. His ammonia level was increased likely secondary from the kidney problems, but needs to be followed as outpatient.  2.  Acute on chronic renal failure, has resolved with IV fluids which have been stopped.  3.  Urinary tract infection culture is negative. Continue ciprofloxacin.  4.  Hypokalemia secondary to diuresis. The patient's potassium was recently stopped secondary to his potassium being high and will be restarted as a lower dose of 20 b.i.d. instead of 40 b.i.d. which he was taking.  5.  Mildly elevated troponin secondary to CHF in setting of CKD. No chest pain, shortness of breath.  On the day of discharge, the patient does not have any crackles, has chronic lower extremity edema. He is alert and oriented x 3. He has ambulated with a walker which is his baseline.   TIME SPENT TODAY ON THIS CASE:  Was 45 minutes.    ____________________________ Molinda Bailiff Advika Mclelland, MD srs:nt D: 10/17/2014 12:16:25 ET T: 10/17/2014 23:02:30 ET JOB#: [id number]  cc: Wardell Heath R. Cornie Mccomber, MD, <Dictator> Orie Fisherman MD ELECTRONICALLY SIGNED 10/24/2014 13:07

## 2015-03-01 NOTE — H&P (Signed)
PATIENT NAME:  Carl Perry, Carl Perry MR#:  [id number] DATE OF BIRTH:  [birth year]  DATE OF ADMISSION:  02/19/2014  PRIMARY CARE PHYSICIAN:  Dr. Juel Burrow,  nephrologist Dr. Wynelle Link.   CHIEF COMPLAINT: Left lower extremity swelling, increased weight by 20-25 pounds.   HISTORY OF PRESENT ILLNESS: Carl Perry is a 59 year old Caucasian gentleman with well known history of hypertension, morbid obesity, recurrent admissions for CHF comes in for his approximately 5th admission this year with increasing shortness of breath and weight gain of about 20-25 pounds recently. He has CKD stage III. Baseline creatinine is around 2.5, follows with Dr. Wynelle Link. He comes in from Dr. Alice Rieger office with the above mentioned chief complaint. The patient is to going to receive an IV bolus of Lasix and then be started on IV Lasix drip for his acute on chronic CHF, which appears to be diastolic in nature.   PAST MEDICAL HISTORY:  1.  Hypertension.  2.  Morbid obesity.  3.  COPD.  4.  Obstructive sleep apnea on CPAP.  5.  Type 2 diabetes, insulin requiring.  6.  CKD stage III, baseline creatinine around 2.5.  7.  History of rhabdomyolysis due to statin.   8.  Diabetic retinopathy.  9.  Diabetic neuropathy.  10. History of hernia repair.   SOCIAL HISTORY: Married, lives with his wife. He is retired. He has limited mobility. He is a former cigarette smoker, quit in 2014. No alcohol or recreational drug use.   FAMILY HISTORY: Positive for kidney disease involving mother and a sister has breast cancer.   MEDICATIONS:  1.  Amitriptyline 25 mg 2 tablets at bedtime.  2.  Aspirin 81 mg daily.  3.  Carvedilol 6.25 b.i.d.  4.  Lasix 40 mg 2 tablets b.i.d.  5.  Glipizide 10 mg b.i.d. as needed.  6.  Levemir 50 units at bedtime.  7.  Metolazone 5 mg 1 tablet in the morning before Lasix started on 02/14/2014.  8.  Tamsulosin 0.4 mg at bedtime.  9.  Tramadol 50 mg 4 times a day.  10. Venlafaxine 37.5 p.o. b.i.d.  11. Verapamil  120 mg p.o. b.i.d.  12. Victoza 0.6 mL per 1.8 mL, take 1.8 pen injector subcutaneous every morning.   ALLERGIES:  LOVASTATIN AND PENICILLIN.   REVIEW OF SYSTEMS: CONSTITUTIONAL: No fever. Positive for fatigue, weakness, and shortness of breath.  EYES: No blurred or double vision, glaucoma, or cataracts.  EARS, NOSE AND THROAT: No tinnitus, ear pain, discharge, snoring, or postnasal drip.  RESPIRATORY: Positive for cough, shortness of breath and dyspnea on exertion.  CARDIOVASCULAR: Positive for dyspnea on exertion, orthopnea, edema.  GASTROINTESTINAL: No nausea, vomiting, diarrhea, abdominal pain. No GERD.  GENITOURINARY: No dysuria, hematuria, or frequency.  ENDOCRINE: No polyuria, nocturia, or thyroid problems.  HEMATOLOGY: Positive for chronic anemia of chronic disease. No bleeding or easy bruising.  SKIN: No acne, rash, or lesion.  MUSCULOSKELETAL: Positive for arthritis. No swelling or gout.  NEUROLOGIC: No CVA, TIA, ataxia, and dementia.  PSYCHIATRIC: No anxiety or depression.   All other systems reviewed and negative.   PHYSICAL EXAMINATION:  GENERAL: Patient is morbidly obese not in acute distress.  VITAL SIGNS: He is afebrile. Pulse is 69. Blood pressure is 166/79. Saturations are 96% on room air.  HEENT: Atraumatic, normocephalic. PERRLA. EOM intact. Oral mucosa is moist.  NECK: Supple. No JVD. No carotid bruit.  RESPIRATORY: Decreased breath sounds at the bases. No rales, rhonchi, respiratory distress, or labored breathing.  HEART: Both the  heart sounds are normal. Rate, rhythm regular. PMI not lateralized. Chest nontender.  EXTREMITIES: Significant 4+ in pitting edema with chronic venous stasis changes. Cannot appreciate pedal pulses. Cannot appreciate femoral pulses secondary to body habitus being obese. The patient has hypopigmented patches suggestive of chronic venous insufficiency both lower extremities. His skin is quite thick as well.  NEUROLOGIC: Grossly intact  cranial nerves II-XII. No motor or sensory deficits.  SKIN: Warm and dry.  PSYCHIATRIC:  The patient is awake, alert, oriented x 3.   LABORATORY DATA: Glucose is 170, BUN 31, creatinine 2.2, sodium is 133, potassium 3.7, chloride is 94, bicarbonate is 35, calcium is 9.6. Cardiac enzymes 1st set negative. CBC: White  count is 8.7 and hemoglobin and hematocrit is 10.1 and 30.8 today. Platelet count is 287,000.   ASSESSMENT: 59 year old Carl Perry with history of chronic diastolic CHF, chronic CKD, comes in with increasing shortness of breath, was admitted with:  1.  Acute on chronic diastolic CHF with increase in weight gain and volume overload. Patient also has significant lower extremity edema with scrotal edema too. We will admit patient on telemetry floor, 2 grams sodium,  ADA 1800-calorie renal diet. Will start him on IV Lasix drip. This was discussed with Dr. Wynelle LinkKolluru who referred patient for admission. We will monitor I and Os and creatinine closely.   2.  Acute on chronic CKD. Creatinine is around 2.2, which is his baseline. We will consult nephrology. We will watch Is and Os. Avoid nephrotoxins.  3.  Hypertension. Seems to be fairly controlled. We will continue his BP medications.  4.  Anemia of chronic disease, stable.  5.  DVT prophylaxis with subcutaneous heparin.  6.  Type 2 diabetes. Continue his insulin regimen from home. Was discussed with the patient. Patient's daughter and wife were present in the room.   CODE STATUS:  The patient will be a full code for now. Wife is to discuss code status with patient.   TIME SPENT: 50 minutes.    ____________________________ Wylie HailSona A. Allena KatzPatel, MD sap:tc D: 02/19/2014 15:29:55 ET T: 02/19/2014 17:24:56 ET JOB#: [id number]  cc: Tamryn Popko A. Allena KatzPatel, MD, <Dictator> Willow OraSONA A Vielka Klinedinst MD ELECTRONICALLY SIGNED 03/04/2014 9:09

## 2015-03-01 NOTE — Consult Note (Signed)
PATIENT NAME:  Carl Perry, Carl Perry MR#:  [id number] DATE OF BIRTH:  November 07, 1956  DATE OF CONSULTATION:  11/27/2013  REFERRING PHYSICIAN:   CONSULTING PHYSICIAN:  Carl ComaGeorge Wallace Kernodle Jr., MD  REASON FOR CONSULTATION:  Elevated sed rate, rule out polymyositis; elevated CPK.   HISTORY OF PRESENT ILLNESS:  A 59 year old white male who has history of obesity, sleep apnea and significant diabetes. He has been followed by Dr. Renae FicklePaul, Dr. Juel BurrowMasoud. Last creatinine in the fall was 2.7. He has no neuropathy. Hemoglobin A1c was 8.7. He was on a statin drug in the form of lovastatin. About 3 weeks ago, he became very weak in his legs, had more swelling than usual, a hard time even getting off the toilet; came to the Emergency Room where his CPK was nearly 62,000. Creatinine was elevated. He has progressed to decreased his CPK down to 5964. Creatinine is elevated at 7.7. He has had some urine output. BUN is at 13. He has not had any headache. He denies any Raynaud's or  photosensitivity. His arm has not been particularly weak, it has mainly been his legs. Feels like his legs have improved a good deal. He can now raise them up across the bed and can  get up, whereas he could not before. He does have numbness in his hands and his feet from  prior diabetic neuropathy. He does have significant proteinuria, thought to be nephrotic range, and has been followed by nephrology.  He has not been dialyzed. Urinalysis revealed greater than 500 protein and greater than 500 glucose, no significant cells. There has been no rash in the face or the hands.  His small joints have not bothered him.   PAST MEDICAL HISTORY: Obesity, diabetes, coronary disease, edema, nephrotic syndrome, cardiomyopathy, neuropathy, chronic pain, peripheral venous disease.   PAST SURGICAL HISTORY: Foot surgery, prior toe amputation, he had a hernia repair, tonsillectomy, hemorrhoidectomy.   REVIEW OF SYSTEMS: No recent chest pain, abdominal pain, skin rash.    PHYSICAL EXAMINATION: GENERAL: Pleasant male, mild dyspnea with moving around in the bed,  VITAL SIGNS: Afebrile, heart rate 73, blood pressure 137/68. O2 sat 91% on room air.  SKIN: Without rash in his hands, face or chest.  HEENT: Sclerae clear.  RESPIRATORY:  Distant lung sounds.  CARDIAC:  No significant murmur.  ABDOMEN: Nontender.  EXTREMITIES:  Has 3+ lower extremity edema.  MUSCULOSKELETAL: Good range of motion neck and shoulders. Hands without synovitis. Knees without effusion. Hips move well.  NEUROLOGIC: He has hyperreflexic upper and lower extremities. He can raise his legs against mild resistance with 4/5 hip flexors, 5/5 plantar and dorsiflexors; 5/5 biceps, triceps and neck flexors.   IMPRESSION: 1.  Acute rhabdomyolysis, most likely statin-related in the setting of renal insufficiency, improving. Little evidence of chronic dermatomyositis or polymyositis.  2.  Elevated sedimentation rate at greater than 140, most likely related to significant proteinuria rather than an inflammatory problem of the muscles.  3.  Diabetes.  4.  Acute and chronic renal failure.  5.  Sleep apnea.   RECOMMENDATIONS: 1.  There is no indication for muscle biopsy at this juncture.  2.  Continued nephrology support. May or may not need dialysis with this insult.  3.  Continue to follow his CPKs. Suspect physical therapy would be helpful now that he can raise his legs against gravity.  4.  Avoid statin use in the future.   ____________________________ Carl ComaGeorge Wallace Kernodle Jr., MD gwk:cs D: 11/27/2013 18:02:38 ET T: 11/27/2013 18:26:09 ET  JOB#: [id number]  cc: Carl Coma., MD, <Dictator> Laverda Sorenson, MD Webb Silversmith MD ELECTRONICALLY SIGNED 11/28/2013 12:58

## 2015-03-01 NOTE — Consult Note (Signed)
Referring Physician:  Albertine Perry   Primary Care Physician:  Carl Perry Carl Perry, [address], [place], [place] [address], [place] [id number]  Past Medical/Surgical Hx:  Chronic Renal Insufficiency:   Sleep Apnea: uses cpap  Obesity:   Bell's Palsy:   HTN:   COPD:   DM:   Diabetes Mellitus, Type II (NIDD):   hemorrhoidectomy:   Right toe amputation:   right foot surgery:   abd hernia surgery with mesh many years ago 1989:   Home Medications: Medication Instructions Last Modified Date/Time  insulin detemir 100 units/mL subcutaneous solution 50 unit(s) subcutaneous once a day (at bedtime) 17-Mar-15 23:38  Lasix 40 mg oral tablet 1 tab(s) orally 2 times a day 17-Mar-15 23:38  amitriptyline 25 mg oral tablet 2 tab(s) orally once a day (at bedtime) 17-Mar-15 23:38  venlafaxine 37.5 mg oral tablet 1 tab(s) orally 2 times a day 17-Mar-15 23:38  traMADol 50 mg oral tablet 1 tab(s) orally every 6 hours x 5 days, As Needed, pain , As needed, pain 17-Mar-15 23:38  tamsulosin 0.4 mg oral capsule 1 cap(s) orally once a day (at bedtime) 17-Mar-15 23:38  Aspirin Enteric Coated 81 mg oral delayed release tablet 1 tab(s) orally once a day 17-Mar-15 23:38  carvedilol 6.25 mg oral tablet 1 tab(s) orally 2 times a day 17-Mar-15 23:38  lovastatin 20 mg oral tablet  orally 2 times a day 17-Mar-15 23:38  verapamil 120 mg oral tablet  orally 2 times a day 17-Mar-15 23:38  gemfibrozil 600 mg oral tablet 1 tab(s) orally 2 times a day 17-Mar-15 23:38  Victoza 1.8 unit(s) subcutaneous once a day 17-Mar-15 23:38   Allergies:  Penicillin: Rash  Statins: Other  Vital Signs: **Vital Signs.:   18-Mar-15 11:39  Vital Signs Type Routine  Temperature Temperature (F) 97.6  Celsius 36.4  Temperature Source oral  Pulse Pulse 53  Respirations Respirations 18  Systolic BP Systolic BP 747  Diastolic BP (mmHg) Diastolic BP (mmHg) 66  Mean BP 84  Pulse Ox %  Pulse Ox % 94  Pulse Ox Activity Level  At rest  Oxygen Delivery Room Air/ 21 %   Lab Results: Hepatic:  17-Mar-15 21:17   Bilirubin, Total 0.6  Alkaline Phosphatase 83 (45-117 NOTE: New Reference Range 09/28/13)  SGPT (ALT) 53  SGOT (AST)  172  Total Protein, Serum 7.4  Albumin, Serum  2.7  Routine Chem:  18-Mar-15 05:21   Result Comment TROPONIN - RESULTS VERIFIED BY REPEAT TESTING.  - PREV. C/ 01-22-14 $RemoveBe'@2251'[id number]$  BY AJO. AJO  Result(s) reported on 23 Jan 2014 at 06:07AM.  Glucose, Serum  117  BUN  28  Creatinine (comp)  1.59  Sodium, Serum 138  Potassium, Serum 3.7  Chloride, Serum 106  CO2, Serum 28  Calcium (Total), Serum 8.8  Anion Gap  4  Osmolality (calc) 282  eGFR (African American)  55  eGFR (Non-African American)  47 (eGFR values <85mL/min/1.73 m2 may be an indication of chronic kidney disease (CKD). Calculated eGFR is useful in patients with stable renal function. The eGFR calculation will not be reliable in acutely ill patients when serum creatinine is changing rapidly. It is not useful in  patients on dialysis. The eGFR calculation may not be applicable to patients at the low and high extremes of body sizes, pregnant women, and vegetarians.)  Magnesium, Serum  1.7 (1.8-2.4 THERAPEUTIC RANGE: 4-7 mg/dL TOXIC: > 10 mg/dL  -----------------------)  Cardiac:  17-Mar-15 21:17  CK, Total  3576 (39-308 NOTE: NEW REFERENCE RANGE  12/10/2013)  18-Mar-15 05:21   CPK-MB, Serum  29.3  Troponin I  0.22 (0.00-0.05 0.05 ng/mL or less: NEGATIVE  Repeat testing in 3-6 hrs  if clinically indicated. >0.05 ng/mL: POTENTIAL  MYOCARDIAL INJURY. Repeat  testing in 3-6 hrs if  clinically indicated. NOTE: An increase or decrease  of 30% or more on serial  testing suggests a  clinically important change)  Routine UA:  18-Mar-15 07:50   Color (UA) Yellow  Clarity (UA) Clear  Glucose (UA) 150 mg/dL  Bilirubin (UA) Negative  Ketones (UA) Negative  Specific Gravity  (UA) 1.014  Blood (UA) 3+  pH (UA) 5.0  Protein (UA) >=500  Nitrite (UA) Negative  Leukocyte Esterase (UA) Negative (Result(s) reported on 23 Jan 2014 at 08:09AM.)  RBC (UA) 5 /HPF  WBC (UA) 2 /HPF  Bacteria (UA) TRACE  Epithelial Cells (UA) 1 /HPF  Mucous (UA) PRESENT  Hyaline Cast (UA) 2 /LPF (Result(s) reported on 23 Jan 2014 at 08:09AM.)  Routine Hem:  18-Mar-15 05:21   WBC (CBC) 10.2  RBC (CBC)  4.08  Hemoglobin (CBC)  11.2  Hematocrit (CBC)  33.4  Platelet Count (CBC) 385  MCV 82  MCH 27.3  MCHC 33.4  RDW  16.3  Neutrophil % 60.5  Lymphocyte % 29.8  Monocyte % 7.3  Eosinophil % 1.8  Basophil % 0.6  Neutrophil # 6.2  Lymphocyte # 3.0  Monocyte # 0.7  Eosinophil # 0.2  Basophil # 0.1 (Result(s) reported on 23 Jan 2014 at 05:51AM.)   Radiology Results: CT:    17-Mar-15 22:21, CT Head Without Contrast  CT Head Without Contrast   REASON FOR EXAM:    fall, weakness  COMMENTS:       PROCEDURE: CT  - CT HEAD WITHOUT CONTRAST  - Jan 22 2014 10:21PM     CLINICAL DATA:  Weakness.    EXAM:  CT HEAD WITHOUT CONTRAST    TECHNIQUE:  Contiguous axial images were obtained from the base of the skull  through the vertex without intravenous contrast.    COMPARISON:  None.  FINDINGS:  No mass. No hydrocephalus. No hemorrhage. No acute bony abnormality  identified. Visualized paranasal sinuses are clear.     IMPRESSION:  No acute abnormality.      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

## 2015-03-01 NOTE — Discharge Summary (Signed)
PATIENT NAME:  Carl Perry, Carl Perry MR#:  [id number] DATE OF BIRTH:  Jul 26, 1956  DATE OF ADMISSION:  02/19/2014 DATE OF DISCHARGE:  02/25/2014  PRESENTING COMPLAINT: Leg swelling, increasing shortness of breath and weight gain.  DISCHARGE DIAGNOSES: 1.  Acute on chronic diastolic congestive heart failure.  2.  Chronic leg edema.  3.  Hypertension.  4.  Type 2 diabetes. 5.  Chronic kidney disease stage IV.  6.  Morbid obesity.  7.  Saturation more than 92% on 2 liters nasal cannula oxygen. Home oxygen has been set up.   CODE STATUS: FULL.  DISCHARGE MEDICATIONS: 1.  Aspirin 81 mg daily.  2.  Flomax 0.4 mg p.o. at bedtime.  3.  Venlafaxine 37.5 mg p.o. b.i.d.  4.  Carvedilol 6.25 mg b.i.d.  5.  Verapamil 120 mg b.i.d.  6.  amitryptiline 50 mg at bedtime. 7.  Insulin Detemir 50 units once a day at bedtime.  8.  Gemfibrozil 600 mg b.i.d.  9.  Glipizide 10 mg b.i.d. 10.  Tramadol 50 mg 4 times a day.  11.  Victoza 18 mg/3 mL one dose subcu daily in the morning.  12.  Prednisone 10 mg one b.i.d.  13.  Torsemide 100 mg b.i.d.  14.  K-Dur 20 mEq 2 tablets b.i.d.   DISCHARGE INSTRUCTIONS:  1.  Home health has been arranged. Physical therapy, occupational therapy and nurse aide has been resumed.  2.  Tele nursing CHF management protocol instruction for new home oxygen.   DISCHARGE DIET: Low-sodium, carbohydrate-controlled.  DISCHARGE FOLLOWUP:  1.  Follow up with Dr. Wynelle LinkKolluru in 1 to 2 weeks.  2.  Follow up with Dr. Juel BurrowMasoud in 1 to 2 weeks.   CONSULTANTS: Nephrology with Dr. Cherylann RatelLateef and Dr. Thedore MinsSingh.   LABORATORY DATA: Creatinine at discharge is 2.44, potassium is 3.6. Platelet count is 343,000 and H and H is 10.1 and 30.8. Cardiac enzymes x3 were negative.   HISTORY OF PRESENT ILLNESS: Mr. Carl Perry is a 59 year old morbidly obese Caucasian gentleman with history of chronic congestive heart failure, CKD stage III, BPH, and depression who came to the Emergency Room with increasing shortness of  breath, leg pain, increase and worsening lower extremity edema, shortness of breath, and about 20 to 25 pound weight gain. He was admitted with: 1.  Acute on chronic diastolic congestive heart failure. The patient's echo in January showed EF of 60% to 65%. He was started on Lasix drip along with albumin infusion per nephrology recommendation. His weight came down 18 pounds since admission. He was negative by 7.8 liters on his urine output. The patient was readdressed about salt and fluid restriction. The patient's Coreg and verapamil were continued. He has been set up for home oxygen.  2.  Acute respiratory failure due to acute on chronic diastolic congestive heart failure as above.  3.  CKD, stage III. Creatinine appears to be stable at 2.44. His baseline is around 1.35. Nephrology followed and the patient will continue to follow with them as an outpatient. The patient's Lasix drip was changed to torsemide 100 mg b.i.d. along with potassium replacement. 4.  BPH. Continued Flomax. 5.  Depression. On Elavil and Effexor.  6.  Type 2 diabetes. Continued Levemir, sliding scale and p.o. home meds.   The patient's home health will be resumed. CHF nursing protocol has been introduced and the patient has been discharged on home oxygen. Options about discharging to LTAC were discussed, however, the patient refused.  The patient remained a FULL code.  TIME SPENT: 40 minutes.   ____________________________ Carl Hail Allena Katz, MD sap:sb D: 02/26/2014 07:08:34 ET T: 02/26/2014 07:34:45 ET JOB#: [id number]  cc: Nycole Kawahara A. Allena Katz, MD, <Dictator> Willow Ora MD ELECTRONICALLY SIGNED 03/04/2014 9:13

## 2015-03-01 NOTE — Consult Note (Signed)
Admit Diagnosis:   ACUTE RENAL FAILURE: Onset Date: 07-Dec-2013, Status: Active, Description: ACUTE RENAL FAILURE    Chronic Renal Insufficiency:    Sleep Apnea: uses cpap   Obesity:    Bell's Palsy:    HTN:    COPD:    DM:    Diabetes Mellitus, Type II (NIDD):    hemorrhoidectomy:    Right toe amputation: Apr 2014   right foot surgery:    abd hernia surgery with mesh many years ago 1989:   Home Medications: Medication Instructions Status  amitriptyline 25 mg oral tablet 2 tabs orally once a day (at bedtime) Active  Aspirin Enteric Coated 81 mg oral delayed release tablet 1 tab(s) orally once a day Active  carvedilol 6.25 mg oral tablet 1 tab(s) orally 2 times a day Active  verapamil 120 mg/24 hours oral capsule, extended release 1 cap(s) orally 2 times a day Active  venlafaxine 37.5 mg oral tablet 1 tab(s) orally 2 times a day Active  Levemir 100 units/mL subcutaneous solution 28 unit(s) subcutaneous once a day (at bedtime) Active    Penicillin: Rash  Nursing Flowsheets: **Vital Signs.:   30-Jan-15 13:37  Temperature Temperature (F) 98.6  Systolic BP Systolic BP 144  Diastolic BP (mmHg) Diastolic BP (mmHg) 67    General Aspect Pt seen for diabetic foot ulcers.  Noticed left 4th toenail loose with bleeding.  Has hx of right great toe amputation.  Admitted with worsening swelling, ARF.   Case History and Physical Exam:  Cardiovascular Good CFT <5secs, barely palpable dp, non-palp pt secondary to edema.  Foot warm   Musculoskeletal Edema to b/l lower legs.  2+ pitting   Neurological Protective sensation absent.   Skin Loose left 4th toe nail was removed at bedside.  Nail was 90% not attached.  Small non-infected superficial fissure left foot.  Miild hyperkeratosis.  Right great toe am site with 3mm superficial bloody blister without infection.  No purulence.    Impression Diabetic neuropathy. Superficail non-infected right great toe blister. Left 4th  toe nail trauma with avulsion. Non-infected left foot fissure.   Plan Antibiotic ointment to nail avulsion site. Band-aid to right great toe blister. Pt has f/u with Dr. Orland Jarredroxler early Feb.  Pt encouraged to keep. No acute diabetic foot issues.  Re-consult if needed.  Will sign off.   Electronic Signatures: Gwyneth RevelsFowler, Jessly Lebeck (MD)  (Signed 30-Jan-15 14:37)  Authored: Health Issues, Significant Events - History, Home Medications, Allergies, Vital Signs, General Aspect/Present Illness, History and Physical Exam, Impression/Plan   Last Updated: 30-Jan-15 14:37 by Gwyneth RevelsFowler, Paula Busenbark (MD)

## 2015-03-01 NOTE — Discharge Summary (Signed)
PATIENT NAME:  Carl Perry, Carl Perry MR#:  [id number] DATE OF BIRTH:  Oct 30, 1956 Addendem:  DATE OF ADMISSION:  01/22/2014 DATE OF DISCHARGE:  01/26/2014  HOSPITAL COURSE: The patient has no complaints except bilateral edema. The patient was treated with Lasix. The patient's CK decreased to 6928, and renal function is stable. The patient was discharged to home yesterday. I discussed the patient's discharge plan with the patient and nurse.   TIME SPENT: About 33 minutes.    ____________________________ Shaune PollackQing Marquisha Nikolov, MD qc:cg D: 01/27/2014 14:57:56 ET T: 01/28/2014 01:32:44 ET JOB#: [id number]  cc: Shaune PollackQing Tiras Bianchini, MD, <Dictator> Shaune PollackQING Yuka Lallier MD ELECTRONICALLY SIGNED 01/28/2014 17:27

## 2015-03-01 NOTE — Discharge Summary (Signed)
PATIENT NAME:  KORBIN, MAPPS MR#:  [id number] DATE OF BIRTH:  1956/07/18  DATE OF ADMISSION:  12/04/2013 DATE OF DISCHARGE:  12/11/2013  DISCHARGE DIAGNOSES:  1.  Edema, anasarca, urinary retention, hypoalbuminemia and urinary retention playing a role. 2.  Urine retention. On admission a Foley was placed and 1200 mL of urine came out. Started on Flomax and Foley removed. After that, urine output was good.  3.  Acute on chronic renal failure, metabolic acidosis likely due to rhabdomyolysis and underlying chronic component. Creatinine was trending down since the last few weeks, constantly.  4.  Chronic obstructive pulmonary disease, sleep apnea, CPAP at night and DuoNebs.  5.  Diabetes mellitus.  6.  Atrial fibrillation. Heart rate under control. Left ventricular hypertrophy. An echocardiogram. Advised to have Cardiology Clinic as outpatient.  7.  Hyperlipidemia: Statin was on hold due to rhabdomyolysis.  8.  Hypertension.   CODE STATUS:  FULL CODE.   DISCHARGE MEDICATIONS:  1.  Aspirin 81 mg once a day.  2.  Venlafaxine 37.5 mg oral 2 times a day.  3.  Amitriptyline 25 mg oral tablet 2 tablets once a day at bedtime.  4.  Insulin 22 units subcutaneous at night.  5.  Carvedilol 12.5 mg oral tablet 2 tablets 2 times a day.  6.  Timolol 50 mg oral every 6 hours as needed for pain.  7.  Tamsulosin 0.4 mg oral once a day.  8.  Lasix 40 mg oral tablet 2 times a day.  9.  Docusate sodium 100 mg oral capsule 2 times a day as needed for constipation.  10.  Potassium chloride 20 mEq once a day.  11.  Magnesium oxide 400 mg oral tablet 2 times a day.  12.  Pantoprazole 40 mg delayed-release tablet once a day.   DIET ON DISCHARGE:  Low-sodium, low-fat, low-cholesterol, carbonate-controlled ADA and renal diet. Regular consistency.   DISCHARGE ACTIVITIES:  Advised to have activities as tolerated.   TIMEFRAME TO FOLLOWUP:  Within 1 to 2 weeks with Nephrology Clinic. Advised to follow in Nephrology  in 1 week to check renal function and in Cardiology Clinic in 2 weeks for episode of atrial fibrillation in hospital and lose weight.   HISTORY OF PRESENT ILLNESS:  Presented to the hospital with complaint of urinary retention, scrotal swelling, generalized anasarca and worsening of shortness of breath. The patient was in the hospital for almost a week for rhabdomyolysis and renal failure and was discharged home 2 days ago but continued having complaint of severe swelling and so decided to come back to hospital. After coming to the Emergency Room, a Foley was placed and which drained 1200 mL of urine immediately and the patient had severe swelling overall, especially on the genitals, and so admitted with further management of his swelling and fluid retention. A Nephrology consult was called in with Dr. Thedore Mins, and he started the patient on IV albumin daily with IV Lasix and Foley catheter was placed. We started on Flomax because of the patient's complaint of urinary retention on presentation and the patient had very good response to that. Overall the patient lost like more than 15 pounds weight in the hospital and had more than 5 L negative fluid volume diuresis in the hospital with this therapy. His renal function, the creatinine was more than 5 on admission, which came down less than 3 and overall he was feeling better, so we decided to discharge him to a rehab because of a generalized weakness  and follow with Nephrology Clinic within a week to check continuation of his improvement in renal function.  OTHER MEDICAL ISSUES IN THIS HOSPITAL: 1.  COPD. The patient has sleep apnea and there was some wheezing in the hospital so we started on DuoNeb and continued on CPAP treatment at night.  2.  Diabetes. We continued on Levemir and sliding scale coverage.  3.  Atrial fibrillation. This was just one episode during this hospital admission. His echocardiogram showed left ventricular hypertrophy. Heart rate was under  control with 60s and 70s, so we continued on the therapy with rate control and advised to go to Cardiology Clinic after discharge.  4.  Hyperlipidemia. Statin was on hold because of rhabdomyolysis in the last admission and we did not restart it in this admission because of still present renal failure.  5.  Hypertension. This was under control with Coreg and Cardizem and we discharged with the same.   CONSULTS IN THE HOSPITAL:  Dr. Cherylann RatelLateef and Dr. Thedore MinsSingh for Nephrology.   IMPORTANT LABORATORY RESULTS IN THE HOSPITAL:  The patient was discharged from hospital on 12/02/2013 with creatinine of 5.85. Came on 12/03/2013 in the afternoon with creatinine of 5.4, potassium was 3.4. White cell count 8.6 and troponin less than 0.02. Blood cultures remained negative in this hospital course. Urinalysis was negative. BNP was 1993. Blood cultures were negative in this hospital. Creatinine gradually improved in this hospital course from 5.4 on admission to 2.85 on 12/11/2013 on discharge.   TOTAL TIME SPENT ON THIS DISCHARGE:  40 minutes.    ____________________________ Hope PigeonVaibhavkumar G. Elisabeth PigeonVachhani, MD vgv:jm D: 12/14/2013 10:59:55 ET T: 12/14/2013 11:44:56 ET JOB#: [id number]  cc: Hope PigeonVaibhavkumar G. Elisabeth PigeonVachhani, MD, <Dictator> Munsoor Lizabeth LeydenN. Lateef, MD Chelsea AusMuhammad A. Kirke CorinArida, MD Corky DownsJaved Masoud, MD Heath GoldVAIBHAVKUMAR [facility] Center[facility]ANI MD ELECTRONICALLY SIGNED 12/29/2013 8:36

## 2015-03-03 ENCOUNTER — Ambulatory Visit
Admit: 2015-03-03 | Disposition: A | Discharge status: Home / Self Care | Payer: Self-pay | Attending: Cardiovascular Disease | Admitting: Cardiovascular Disease

## 2015-03-04 ENCOUNTER — Inpatient Hospital Stay
Admission: AD | Admit: 2015-03-04 | Discharge: 2015-03-09 | DRG: 291 | Disposition: A | Discharge status: Home / Self Care | Payer: Commercial Managed Care - HMO | Attending: Internal Medicine | Admitting: Internal Medicine

## 2015-03-04 DIAGNOSIS — N179 Acute kidney failure, unspecified: Secondary | ICD-10-CM | POA: Diagnosis present

## 2015-03-04 DIAGNOSIS — R609 Edema, unspecified: Secondary | ICD-10-CM | POA: Diagnosis present

## 2015-03-04 DIAGNOSIS — E11319 Type 2 diabetes mellitus with unspecified diabetic retinopathy without macular edema: Secondary | ICD-10-CM | POA: Diagnosis present

## 2015-03-04 DIAGNOSIS — Z79899 Other long term (current) drug therapy: Secondary | ICD-10-CM

## 2015-03-04 DIAGNOSIS — E785 Hyperlipidemia, unspecified: Secondary | ICD-10-CM | POA: Diagnosis present

## 2015-03-04 DIAGNOSIS — I5033 Acute on chronic diastolic (congestive) heart failure: Principal | ICD-10-CM | POA: Diagnosis present

## 2015-03-04 DIAGNOSIS — G4733 Obstructive sleep apnea (adult) (pediatric): Secondary | ICD-10-CM | POA: Diagnosis present

## 2015-03-04 DIAGNOSIS — I129 Hypertensive chronic kidney disease with stage 1 through stage 4 chronic kidney disease, or unspecified chronic kidney disease: Secondary | ICD-10-CM | POA: Diagnosis present

## 2015-03-04 DIAGNOSIS — Z6841 Body Mass Index (BMI) 40.0 and over, adult: Secondary | ICD-10-CM | POA: Diagnosis not present

## 2015-03-04 DIAGNOSIS — Z7982 Long term (current) use of aspirin: Secondary | ICD-10-CM | POA: Diagnosis not present

## 2015-03-04 DIAGNOSIS — Z880 Allergy status to penicillin: Secondary | ICD-10-CM | POA: Diagnosis not present

## 2015-03-04 DIAGNOSIS — E1140 Type 2 diabetes mellitus with diabetic neuropathy, unspecified: Secondary | ICD-10-CM | POA: Diagnosis present

## 2015-03-04 DIAGNOSIS — Z841 Family history of disorders of kidney and ureter: Secondary | ICD-10-CM | POA: Diagnosis not present

## 2015-03-04 DIAGNOSIS — Z79891 Long term (current) use of opiate analgesic: Secondary | ICD-10-CM | POA: Diagnosis not present

## 2015-03-04 DIAGNOSIS — E876 Hypokalemia: Secondary | ICD-10-CM | POA: Diagnosis not present

## 2015-03-04 DIAGNOSIS — Z803 Family history of malignant neoplasm of breast: Secondary | ICD-10-CM | POA: Diagnosis not present

## 2015-03-04 DIAGNOSIS — E11649 Type 2 diabetes mellitus with hypoglycemia without coma: Secondary | ICD-10-CM | POA: Diagnosis present

## 2015-03-04 DIAGNOSIS — N184 Chronic kidney disease, stage 4 (severe): Secondary | ICD-10-CM | POA: Diagnosis present

## 2015-03-04 DIAGNOSIS — I509 Heart failure, unspecified: Secondary | ICD-10-CM

## 2015-03-04 DIAGNOSIS — Z89431 Acquired absence of right foot: Secondary | ICD-10-CM | POA: Diagnosis not present

## 2015-03-04 DIAGNOSIS — J9600 Acute respiratory failure, unspecified whether with hypoxia or hypercapnia: Secondary | ICD-10-CM | POA: Diagnosis present

## 2015-03-04 DIAGNOSIS — Z888 Allergy status to other drugs, medicaments and biological substances status: Secondary | ICD-10-CM | POA: Diagnosis not present

## 2015-03-04 DIAGNOSIS — J449 Chronic obstructive pulmonary disease, unspecified: Secondary | ICD-10-CM | POA: Diagnosis present

## 2015-03-04 DIAGNOSIS — Z87891 Personal history of nicotine dependence: Secondary | ICD-10-CM | POA: Diagnosis not present

## 2015-03-04 DIAGNOSIS — Z794 Long term (current) use of insulin: Secondary | ICD-10-CM

## 2015-03-04 DIAGNOSIS — D638 Anemia in other chronic diseases classified elsewhere: Secondary | ICD-10-CM | POA: Diagnosis present

## 2015-03-04 LAB — CBC WITH DIFFERENTIAL/PLATELET
Basophil #: 0 10*3/uL (ref 0.0–0.1)
Basophil %: 0.3 %
Eosinophil #: 0 10*3/uL (ref 0.0–0.7)
Eosinophil %: 0.1 %
HCT: 31.5 % — ABNORMAL LOW (ref 40.0–52.0)
HGB: 10.3 g/dL — ABNORMAL LOW (ref 13.0–18.0)
LYMPHS ABS: 1.2 10*3/uL (ref 1.0–3.6)
Lymphocyte %: 19 %
MCH: 26.4 pg (ref 26.0–34.0)
MCHC: 32.7 g/dL (ref 32.0–36.0)
MCV: 81 fL (ref 80–100)
MONOS PCT: 8.7 %
Monocyte #: 0.6 x10 3/mm (ref 0.2–1.0)
NEUTROS ABS: 4.7 10*3/uL (ref 1.4–6.5)
Neutrophil %: 71.9 %
Platelet: 367 10*3/uL (ref 150–440)
RBC: 3.9 10*6/uL — AB (ref 4.40–5.90)
RDW: 17.3 % — AB (ref 11.5–14.5)
WBC: 6.5 10*3/uL (ref 3.8–10.6)

## 2015-03-04 LAB — COMPREHENSIVE METABOLIC PANEL
ANION GAP: 12 (ref 7–16)
Albumin: 3.1 g/dL — ABNORMAL LOW
Alkaline Phosphatase: 74 U/L
BUN: 99 mg/dL — ABNORMAL HIGH
Bilirubin,Total: 0.4 mg/dL
CO2: 32 mmol/L
Calcium, Total: 9.2 mg/dL
Chloride: 93 mmol/L — ABNORMAL LOW
Creatinine: 3.24 mg/dL — ABNORMAL HIGH
EGFR (Non-African Amer.): 20 — ABNORMAL LOW
GFR CALC AF AMER: 23 — AB
GLUCOSE: 106 mg/dL — AB
Potassium: 3.8 mmol/L
SGOT(AST): 21 U/L
SGPT (ALT): 15 U/L — ABNORMAL LOW
Sodium: 137 mmol/L
Total Protein: 8.3 g/dL — ABNORMAL HIGH

## 2015-03-05 LAB — CBC WITH DIFFERENTIAL/PLATELET
BASOS PCT: 0.2 %
Basophil #: 0 10*3/uL (ref 0.0–0.1)
EOS ABS: 0.1 10*3/uL (ref 0.0–0.7)
Eosinophil %: 0.9 %
HCT: 32.8 % — AB (ref 40.0–52.0)
HGB: 10.8 g/dL — AB (ref 13.0–18.0)
Lymphocyte #: 2.8 10*3/uL (ref 1.0–3.6)
Lymphocyte %: 35.6 %
MCH: 26.3 pg (ref 26.0–34.0)
MCHC: 33 g/dL (ref 32.0–36.0)
MCV: 80 fL (ref 80–100)
MONO ABS: 0.8 x10 3/mm (ref 0.2–1.0)
Monocyte %: 10.6 %
NEUTROS ABS: 4.1 10*3/uL (ref 1.4–6.5)
Neutrophil %: 52.7 %
Platelet: 373 10*3/uL (ref 150–440)
RBC: 4.11 10*6/uL — ABNORMAL LOW (ref 4.40–5.90)
RDW: 17.3 % — ABNORMAL HIGH (ref 11.5–14.5)
WBC: 7.8 10*3/uL (ref 3.8–10.6)

## 2015-03-05 LAB — BASIC METABOLIC PANEL
ANION GAP: 14 (ref 7–16)
BUN: 88 mg/dL — ABNORMAL HIGH
CALCIUM: 9.2 mg/dL
CHLORIDE: 86 mmol/L — AB
CO2: 34 mmol/L — AB
Creatinine: 3 mg/dL — ABNORMAL HIGH
GFR CALC AF AMER: 25 — AB
GFR CALC NON AF AMER: 22 — AB
Glucose: 104 mg/dL — ABNORMAL HIGH
POTASSIUM: 3.3 mmol/L — AB
SODIUM: 134 mmol/L — AB

## 2015-03-05 NOTE — H&P (Signed)
PATIENT NAME:  Carl Perry, Zeev E MR#:  [id number] DATE OF BIRTH:  1956/03/19  DATE OF ADMISSION:  10/15/2014  PRIMARY CARE PHYSICIAN:  Alba CoryKrichna Sowles, MD, [facility]   HISTORY OF PRESENT ILLNESS:  The patient is a 59 year old Caucasian male with history of CKD, not on hemodialysis; history of hyperammonemia with no known liver disease, history of other medical problems including chronic diastolic CHF with lower extremity edema, morbid obesity, chronic respiratory failure, obstructive sleep apnea who presents to the hospital with complaints of altered mental status. Talking to patient's family, the patient was doing well up until a few days ago when he became more confused, staggering around, shaking, and then stumbling. He fell down yesterday and hurt his right knee. He seemed to be more confused today in the morning and family decided to come to bring him to the hospital. He was seen by home health nurse who found him disoriented and decided to send him here. In the Emergency Room he was complaining of right knee pains; however, denied any other symptoms.   His laboratory studies revealed mild elevation of ammonia level and hospitalist services were contacted for admission.   PAST MEDICAL HISTORY: History significant for history of chronic diastolic CHF, chronic lower extremity edema, hypertension, morbid obesity, chronic obstructive pulmonary disease, chronic respiratory failure on 2 liters of oxygen per nasal cannula at home, usually at nighttime; obstructive sleep apnea on CPAP, history of diabetes mellitus type 2, chronic kidney disease stage IV, diabetic retinopathy as well as neuropathy, history of hernia repair surgery in the past.   SOCIAL HISTORY: He is married, lives with his wife, has limited mobility according to medical records, he used to smoke in the past, stopped approximately 3 years ago, no alcohol abuse, no illicit drug abuse.   FAMILY HISTORY: Chronic kidney disease as  well as breast cancer.   MEDICATIONS: According to medical records, the patient is on amitriptyline 25 mg 2 tablets once at bedtime; aspirin 81 mg p.o. daily, baclofen 10 mg p.o. at bedtime, carvedilol 6.25 mg twice daily, gemfibrozil 600 mg p.o. twice daily, glipizide 10 mg daily as needed, hydrocortisone, neomycin, polymyxin1%-0.35%-10,000U/ml otic suspension 1 drop to each ear 3 times daily; Klor-Con 10 mEq 2 tablets twice daily, Levemir 45 units subcutaneously at bedtime, metolazone 5 mg p.o. once daily; NovoLog sliding scale, Spiriva 1 inhalation once daily, tamsulosin 0.4 mg p.o. once daily, torsemide 100 mg twice daily, venlafaxine 75 mg p.o. once daily, verapamil extended-release 240 mg p.o. once daily, and Victoza 1.6 mg subcutaneously once daily.   ALLERGIES: THE PATIENT IS ALLERGIC TO PENICILLIN AS WELL AS STATINS.   REVIEW OF SYSTEMS: Is very difficult as patient is somewhat somnolent and confused. He admits of right knee pains; however, denies any fevers, cough, abdominal pains, nausea, vomiting or diarrhea recently. Otherwise difficult to obtain.   PHYSICAL EXAMINATION:  VITAL SIGNS: On arrival to the hospital, patient's temperature 97.9, pulse was 65, respiration was 18, blood pressure 124/60; saturation was 94% on room air.  GENERAL: A well-developed, well-nourished, obese Caucasian male laying on stretcher; he is sleepy.  HEENT: His pupils equal, reactive to light, extraocular muscles intact, has normal hearing, no pharynx erythema; tympanic membranes normal bilaterally. The patient does have some scattered around the left ear scrapes, erosions, but no vesicular rash was noted, and no edema or nodularity or inflammation of the skin was noted. Otherwise the patient's pharynx and mucosa is very dry, oral mucosa is very dry.  NECK: No masses,  supple, nontender; thyroid is not enlarged. No adenopathy. No JVD or carotid bruits bilaterally, full range of motion.   LUNGS: Clear to auscultation  anteriorly, no rales, rhonchi, or wheezing, no labored inspirations, increased effort, dullness to percussion or overt respiratory distress.  CARDIOVASCULAR: S1, S2 appreciated, PMI not lateralized, Chest is nontender to palpation, 1+ pedal pulses, and 1 to 2, lower extremity edema bilaterally. No calf tenderness or cyanosis was noted. The patient also has some skin discoloration in the lower extremities and scaling skin and indurated.  ABDOMEN: Soft, nontender. Bowel sounds are present. No murmurs. No hepatosplenomegaly or masses were noted.  RECTAL: Deferred.  MUSCLE STRENGTH: Able to move extremities, no cyanosis, not able to assess for kyphosis. Gait is not tested.  SKIN: Did not reveal any rashes, except as mentioned above, he does have some secondary lesions around his left auricular area, but no visible rash was noted. The patient does have some scrapes on his right knee as well, and no bruising otherwise, no abnormalities noted. No nodularity. Indurated skin was noted in lower extremities. Skin otherwise was warm and dry to palpation, has absent right toe.  LYMPHATIC: No adenopathy in the cervical region.  NEUROLOGICAL: Cranial nerves were intact. The patient does have some dysarthria with dry mouth. The patient is somnolent, intermittently opens his eyes and talks. He is able to follow some commands. He is cooperative but memory is somewhat impaired, he is sleepy.  PSYCHIATRIC: No significant confusion, agitation, was noted.   LABORATORY:  BMP showed BUN and creatinine of 78 and 3.51, potassium of 2.5, estimated  GFR for non-African American would be 19; the patient's CO2 level is elevated to 33, magnesium level 2.9, ammonia level of 35. Liver enzymes, albumin level of 2.3, AST elevated to 38, CK total was 458. Troponin is elevated to 0.06. White blood cell count is normal at 10.4, hemoglobin 10.8, platelet count 293,000; absolute neutrophil count was not checked yet; coagulation panel, pro time  14.1, INR 1.1.  Urinalysis yellow clear urine, 50 mg/dL glucose, negative for bilirubin or ketones. Specific gravity 1.009, pH was 7.01,  100 mg/dL protein, negative for nitrites, 1+ leukocyte esterase, 3 red blood cells, 49 white blood cells, no bacteria or epithelial cells were noted; 17 hyaline casts. ABGs were performed on 28% FiO2, pH was 7.45, pCO2 was 49, pO2 was 95; saturation was 96.9%, with elevated CO2 level of 34.   RADIOLOGIC STUDIES: Chest x-ray, portable single view 10/15/2014, showed no acute cardiopulmonary abnormality. Right knee complete x-ray, 10/15/2014, was negative; CT scan of the head without contrast, 10/15/2014, stable and negative for age, noncontrast CT appearance of the brain. EKG showed normal sinus rhythm with sinus arrhythmia first-degree AV block at 61 beats per minute, normal axis, nonspecific ST-T changes.   ASSESSMENT AND PLAN: 1.  Confusion likely due to hepatic encephalopathy. Admit patient to medical floor, start him on lactulose as well as Xifaxan and intravenous fluids and follow patient's mental status. I cannot rule out also hypercarbia related as patient's pCO2 is mildly elevated at 49, as well as dehydration, very likely is multifactorial.  2.  Acute on chronic renal failure. We will continue intravenous fluids on a low rate, we will check creatinine level in the morning.  3.  Urinary tract infection. Get cultures. Start the patient on Rocephin intravenous.  4.  Hypokalemia. Supplementing intravenous, magnesium level is elevated, signifying dehydration.  5.  Elevated transaminases. CK level is around 450s. We will continue IV fluids. We will  follow CK levels.  6.  Elevated troponin. We will start the patient on aspirin, heparin subcutaneous; cardiology consultation will be requested. We will initiate also beta blockers and nitroglycerin topically.  7.  Hepatic encephalopathy. As mentioned above lactulose and Xifaxan.  8.  Dehydration. Hold diuretics.   TIME  SPENT:  Was 50 minutes.    ____________________________ Katharina Caper, MD rv:nt D: 10/15/2014 17:25:07 ET T: 10/15/2014 19:18:22 ET JOB#: [id number]  cc: Katharina Caper, MD, <Dictator> Onnie Boer. Carlynn Purl, MD Katharina Caper MD ELECTRONICALLY SIGNED 11/19/2014 18:11

## 2015-03-06 LAB — BASIC METABOLIC PANEL
Anion Gap: 16 (ref 7–16)
BUN: 95 mg/dL — ABNORMAL HIGH
CALCIUM: 9.4 mg/dL
CO2: 33 mmol/L — AB
Chloride: 84 mmol/L — ABNORMAL LOW
Creatinine: 2.86 mg/dL — ABNORMAL HIGH
EGFR (African American): 27 — ABNORMAL LOW
EGFR (Non-African Amer.): 23 — ABNORMAL LOW
GLUCOSE: 124 mg/dL — AB
Potassium: 3.3 mmol/L — ABNORMAL LOW
SODIUM: 133 mmol/L — AB

## 2015-03-07 LAB — BASIC METABOLIC PANEL
Anion Gap: 15 (ref 7–16)
BUN: 97 mg/dL — ABNORMAL HIGH
CREATININE: 2.68 mg/dL — AB
Calcium, Total: 9.3 mg/dL
Chloride: 85 mmol/L — ABNORMAL LOW
Co2: 34 mmol/L — ABNORMAL HIGH
EGFR (African American): 29 — ABNORMAL LOW
GFR CALC NON AF AMER: 25 — AB
Glucose: 130 mg/dL — ABNORMAL HIGH
Potassium: 3 mmol/L — ABNORMAL LOW
SODIUM: 134 mmol/L — AB

## 2015-03-07 LAB — MAGNESIUM: Magnesium: 2.3 mg/dL

## 2015-03-08 DIAGNOSIS — I509 Heart failure, unspecified: Secondary | ICD-10-CM

## 2015-03-08 LAB — BASIC METABOLIC PANEL
Anion Gap: 11 (ref 7–16)
BUN: 82 mg/dL — ABNORMAL HIGH
CALCIUM: 9.3 mg/dL
CO2: 32 mmol/L
Chloride: 94 mmol/L — ABNORMAL LOW
Creatinine: 2.41 mg/dL — ABNORMAL HIGH
GFR CALC AF AMER: 33 — AB
GFR CALC NON AF AMER: 29 — AB
Glucose: 111 mg/dL — ABNORMAL HIGH
POTASSIUM: 3.4 mmol/L — AB
SODIUM: 137 mmol/L

## 2015-03-08 MED ORDER — TIOTROPIUM BROMIDE MONOHYDRATE 18 MCG IN CAPS
18.0000 ug | ORAL_CAPSULE | Freq: Every day | RESPIRATORY_TRACT | Status: DC
  Administered 2015-03-09: 18 ug via RESPIRATORY_TRACT
  Filled 2015-03-08: qty 5

## 2015-03-08 MED ORDER — ONDANSETRON HCL 4 MG/2ML IJ SOLN: 4.0000 mg | INTRAMUSCULAR | Status: DC | PRN

## 2015-03-08 MED ORDER — PNEUMOCOCCAL VAC POLYVALENT 25 MCG/0.5ML IJ INJ: 0.5000 mL | INJECTION | INTRAMUSCULAR | Status: DC | PRN

## 2015-03-08 MED ORDER — SODIUM CHLORIDE 0.9 % IV SOLN
INTRAVENOUS | Status: DC
  Administered 2015-03-09: via INTRAVENOUS

## 2015-03-08 MED ORDER — SODIUM CHLORIDE 0.9 % IJ SOLN: 3.0000 mL | INTRAMUSCULAR | Status: DC | PRN

## 2015-03-08 MED ORDER — VENLAFAXINE HCL ER 75 MG PO CP24
75.0000 mg | ORAL_CAPSULE | Freq: Every day | ORAL | Status: DC
  Administered 2015-03-09: 75 mg via ORAL
  Filled 2015-03-08: qty 1

## 2015-03-08 MED ORDER — ACETAMINOPHEN 325 MG PO TABS: 650.0000 mg | ORAL_TABLET | ORAL | Status: DC | PRN

## 2015-03-08 MED ORDER — ASPIRIN EC 81 MG PO TBEC
81.0000 mg | DELAYED_RELEASE_TABLET | Freq: Every day | ORAL | Status: DC
  Administered 2015-03-09: 81 mg via ORAL
  Filled 2015-03-08: qty 1

## 2015-03-08 MED ORDER — OXYCODONE-ACETAMINOPHEN 5-325 MG PO TABS
1.0000 | ORAL_TABLET | Freq: Four times a day (QID) | ORAL | Status: DC | PRN
  Administered 2015-03-09: 1 via ORAL
  Filled 2015-03-08: qty 1

## 2015-03-08 MED ORDER — INSULIN DETEMIR 100 UNIT/ML ~~LOC~~ SOLN
35.0000 [IU] | Freq: Every day | SUBCUTANEOUS | Status: DC
  Filled 2015-03-08: qty 0.35

## 2015-03-08 MED ORDER — TAMSULOSIN HCL 0.4 MG PO CAPS: 0.4000 mg | ORAL_CAPSULE | Freq: Every day | ORAL | Status: DC

## 2015-03-08 MED ORDER — METOLAZONE 5 MG PO TABS
5.0000 mg | ORAL_TABLET | Freq: Every day | ORAL | Status: DC
  Administered 2015-03-09: 5 mg via ORAL
  Filled 2015-03-08: qty 1

## 2015-03-08 MED ORDER — GEMFIBROZIL 600 MG PO TABS
600.0000 mg | ORAL_TABLET | Freq: Two times a day (BID) | ORAL | Status: DC
  Administered 2015-03-09: 600 mg via ORAL
  Filled 2015-03-08: qty 1

## 2015-03-08 MED ORDER — BACLOFEN 10 MG PO TABS: 20.0000 mg | ORAL_TABLET | Freq: Two times a day (BID) | ORAL | Status: DC | PRN

## 2015-03-08 MED ORDER — AMITRIPTYLINE HCL 50 MG PO TABS: 50.0000 mg | ORAL_TABLET | Freq: Every day | ORAL | Status: DC

## 2015-03-08 MED ORDER — DOCUSATE SODIUM 100 MG PO CAPS: 100.0000 mg | ORAL_CAPSULE | Freq: Two times a day (BID) | ORAL | Status: DC | PRN

## 2015-03-08 MED ORDER — INSULIN ASPART 100 UNIT/ML ~~LOC~~ SOLN: 0.0000 [IU] | Freq: Three times a day (TID) | SUBCUTANEOUS | Status: DC

## 2015-03-08 MED ORDER — HEPARIN SODIUM (PORCINE) 5000 UNIT/ML IJ SOLN
5000.0000 [IU] | Freq: Three times a day (TID) | INTRAMUSCULAR | Status: DC
  Administered 2015-03-09: 5000 [IU] via SUBCUTANEOUS

## 2015-03-08 MED ORDER — CARVEDILOL 6.25 MG PO TABS
6.2500 mg | ORAL_TABLET | Freq: Two times a day (BID) | ORAL | Status: DC
  Administered 2015-03-09: 6.25 mg via ORAL
  Filled 2015-03-08: qty 1

## 2015-03-08 MED ORDER — LACTULOSE 10 GM/15ML PO SOLN: 10.0000 g | Freq: Every day | ORAL | Status: DC

## 2015-03-08 MED ORDER — METOCLOPRAMIDE HCL 5 MG/5ML PO SOLN
5.0000 mg | Freq: Three times a day (TID) | ORAL | Status: DC
  Administered 2015-03-09: 5 mg via ORAL
  Filled 2015-03-08 (×4): qty 5

## 2015-03-09 LAB — BASIC METABOLIC PANEL
ANION GAP: 7 (ref 5–15)
BUN: 67 mg/dL — ABNORMAL HIGH (ref 6–20)
CO2: 32 mmol/L (ref 22–32)
Calcium: 9.3 mg/dL (ref 8.9–10.3)
Chloride: 102 mmol/L (ref 101–111)
Creatinine, Ser: 2.18 mg/dL — ABNORMAL HIGH (ref 0.61–1.24)
GFR calc non Af Amer: 32 mL/min — ABNORMAL LOW (ref >60)
GFR, EST AFRICAN AMERICAN: 37 mL/min — AB (ref >60)
Glucose, Bld: 132 mg/dL — ABNORMAL HIGH (ref 65–99)
POTASSIUM: 3.7 mmol/L (ref 3.5–5.1)
Sodium: 141 mmol/L (ref 135–145)

## 2015-03-09 LAB — PLATELET COUNT: Platelets: 318 10*3/uL (ref 150–440)

## 2015-03-09 MED ORDER — AMITRIPTYLINE HCL 50 MG PO TABS: 50.0000 mg | ORAL_TABLET | Freq: Every day | ORAL | Status: DC

## 2015-03-09 NOTE — Discharge Summary (Signed)
[facility]                                                                                   Carl Perry, is a 59 y.o. male  DOB Feb 15, 1956  MRN [id number].  Admission date:  03/04/2015  Admitting Physician  Milagros Loll, MD  Discharge Date:  03/09/2015   Primary MD  Tommy Rainwater, MD  Recommendations for primary care physician for things to follow:   Please follow kidney function closely   Admission Diagnosis  ACUTE ON CHRONIC CHF   Discharge Diagnosis  acute on chronic diastolic CHF, acute on chronic renal failure, IDDM , HTN, COPD, anemia of chonic disease  Active Problems:   Acute CHF      Past Medical History  Diagnosis Date  . COPD (chronic obstructive pulmonary disease)   . Diabetes mellitus without complication   . Hypertension   . CKD (chronic kidney disease)   . Sleep apnea   . Rhabdomyolysis   . Hyperlipidemia   . Morbid obesity   . Depression     Past Surgical History  Procedure Laterality Date  . Toe amputation Right   . Foot surgery Right   . Tonsillectomy    . Abdominal hernia repair  1989       History of present illness and  Hospital Course:     Kindly see H&P for history of present illness and admission details, please review complete Labs, Consult reports and Test reports for all details in brief  HPI  from the history and physical done on the day of admission    Hospital Course    Assessment/Plan 1.  Acute on chronic diastolic congestive heart failure with acute respiratory failure. SOB is better.  Chest x-ray does not show edema, but considering his morbid obesity this not reliable. We will continue to hold off lasix , but continue his metolazone, monitoring Is and Os. He does have acute renal failure and this needs to be closely monitored. His fluid retention is secondary to worsening kidney function, which is now decreasing as well. 2.  Acute  renal failure over chronic kidney disease with uremic symptoms. The patient has had nausea and vomiting along with loss of appetite, some drowsiness secondary to uremia. Nephro recommended to hold Lasix and diuresis , IVF were initiated, better Cr and BUN  today, close to baseline, needs to follow up with nephrology as outpt in 2-3 days 3.  Insulin-dependent diabetes mellitus with hypoglycemia. The patient has had vomiting and hypoglycemic episode 1 time at home. Reduced the dose on his Lantus to 35 units and put him on a sliding scale insulin. Now PO intake has improved, OK to resume home insulin doses.  4.  Hypertension. Continue home medications. Relatively good control.  5.  Chronic obstructive pulmonary disease, stable.  6.  Anemia of chronic disease, stable.   Discharge Planning home, 1-2 days   Code Status Full Code   Critical Care no, 40 min   Case Discussed With patient, family   >50% of visit spent in couseling/coordination of care DR [facility]        Discharge Condition: good   Follow  UP  DR Kolluru in 2-3 days DR Carlynn Purl in 2-3 days     Discharge Instructions  and  Discharge Medications     Discharge Instructions    (HEART FAILURE PATIENTS) Call MD:  Anytime you have any of the following symptoms: 1) 3 pound weight gain in 24 hours or 5 pounds in 1 week 2) shortness of breath, with or without a dry hacking cough 3) swelling in the hands, feet or stomach 4) if you have to sleep on extra pillows at night in order to breathe.    Complete by:  As directed      Diet - low sodium heart healthy    Complete by:  As directed      Discharge instructions    Complete by:  As directed   Follow up with DR.  Lateef /Kolluruin 2-3 days, DR Carlynn Purl in 2-3 days     Increase activity slowly    Complete by:  As directed             Medication List    STOP taking these medications        furosemide 40 MG tablet  Commonly known as:  LASIX     potassium chloride SA 20 MEQ  tablet  Commonly known as:  K-DUR,KLOR-CON     torsemide 100 MG tablet  Commonly known as:  DEMADEX      TAKE these medications        acetaminophen-codeine 300-30 MG per tablet  Commonly known as:  TYLENOL #3  Take 1 tablet by mouth 2 (two) times daily.     amitriptyline 50 MG tablet  Commonly known as:  ELAVIL  Take 1 tablet (50 mg total) by mouth at bedtime.     aspirin 81 MG tablet  Take 81 mg by mouth daily.     baclofen 20 MG tablet  Commonly known as:  LIORESAL  Take 1 tablet by mouth at bedtime as needed (as needed for pain).     carvedilol 6.25 MG tablet  Commonly known as:  COREG  Take 1 tablet by mouth 2 (two) times daily.     docusate sodium 100 MG capsule  Commonly known as:  COLACE  Take 100 mg by mouth 2 (two) times daily as needed for mild constipation.     gemfibrozil 600 MG tablet  Commonly known as:  LOPID  Take 1 tablet by mouth 2 (two) times daily.     glipiZIDE 10 MG tablet  Commonly known as:  GLUCOTROL  Take 1 tablet by mouth as needed.     insulin aspart 100 UNIT/ML injection  Commonly known as:  novoLOG  Inject into the skin 3 (three) times daily before meals. As needed per sliding scale     Insulin Detemir 100 UNIT/ML Pen  Commonly known as:  LEVEMIR  Inject 45 Units into the skin daily at 10 pm.     lactulose 10 GM/15ML solution  Commonly known as:  CHRONULAC  Take 15 mLs by mouth daily.     Liraglutide 18 MG/3ML Sopn  Inject 1.6 mg into the skin daily.     magnesium oxide 400 MG tablet  Commonly known as:  MAG-OX  Take 400 mg by mouth 2 (two) times daily.     metoCLOPramide 5 MG/5ML solution  Commonly known as:  REGLAN  Take 5 mg by mouth 3 (three) times daily as needed for nausea (indigestion and/or heartburn).     metolazone 5 MG tablet  Commonly known as:  ZAROXOLYN  Take 1 tablet by mouth daily as needed.     oxyCODONE-acetaminophen 5-325 MG per tablet  Commonly known as:  PERCOCET/ROXICET  Take 1 tablet by mouth 3  (three) times daily as needed for moderate pain.     pantoprazole 40 MG tablet  Commonly known as:  PROTONIX  Take 40 mg by mouth daily.     tamsulosin 0.4 MG Caps capsule  Commonly known as:  FLOMAX  Take 0.4 mg by mouth at bedtime.     Tiotropium Bromide Monohydrate 2.5 MCG/ACT Aers  Inhale 2.5 mcg into the lungs daily.     traMADol 50 MG tablet  Commonly known as:  ULTRAM  Take 50 mg by mouth every 6 (six) hours as needed.     venlafaxine XR 75 MG 24 hr capsule  Commonly known as:  EFFEXOR-XR  Take 1 capsule by mouth daily.          Diet and Activity recommendation: See Discharge Instructions above   Consults obtained - nephrology DR Cherylann RatelLateef   Major procedures and Radiology Reports - PLEASE review detailed and final reports for all details, in brief -   none   Dg Chest 2 View  03/05/2015   CLINICAL DATA:  59 year old male with history of weakness and shortness of breath. History of congestive heart failure.  EXAM: CHEST  2 VIEW  COMPARISON:  Chest x-ray 03/03/2015.  FINDINGS: Lung volumes are normal. No consolidative airspace disease. No pleural effusions. No pneumothorax. No pulmonary nodule or mass noted. Pulmonary vasculature and the cardiomediastinal silhouette are within normal limits. Atherosclerosis in the thoracic aorta.  IMPRESSION: 1.  No radiographic evidence of acute cardiopulmonary disease. 2. Atherosclerosis.   Electronically Signed   By: Trudie Reedaniel  Entrikin M.D.   On: 03/05/2015 08:08   Dg Chest 2 View  03/03/2015   CLINICAL DATA:  history of pulmonary edema and CHF. Shortness of breath.  EXAM: CHEST  2 VIEW  COMPARISON:  10/15/2014  FINDINGS: Heart and mediastinal contours are within normal limits. No focal opacities or effusions. No acute bony abnormality.  IMPRESSION: No active cardiopulmonary disease.   Electronically Signed   By: Charlett NoseKevin  Dover M.D.   On: 03/03/2015 15:57    Micro Results     No results found for this or any previous visit (from the  past 240 hour(s)).     Today   Subjective:   Carl Perry today has no headache,no chest abdominal pain,no new weakness tingling or numbness, feels much better wants to go home today.     Objective:   Blood pressure 153/64, pulse 67, temperature 98.3 F (36.8 C), temperature source Oral, height 5\' 10"  (1.778 m), weight 125.646 kg (277 lb), SpO2 97 %.   Intake/Output Summary (Last 24 hours) at 03/09/15 0913 Last data filed at 03/09/15 0711  Gross per 24 hour  Intake      0 ml  Output    850 ml  Net   -850 ml    Exam Awake Alert, Oriented x 3, No new F.N deficits, Normal affect [place].AT,PERRAL Supple Neck,No JVD, No cervical lymphadenopathy appriciated.  Symmetrical Chest wall movement, Good air movement bilaterally, CTAB RRR,No Gallops,Rubs or new Murmurs, No Parasternal Heave +ve B.Sounds, Abd Soft, Non tender, No organomegaly appriciated, No rebound -guarding or rigidity. No Cyanosis, Clubbing or edema, No new Rash or bruise  Data Review   CBC w Diff: Lab Results  Component Value Date   WBC 7.8 03/05/2015  HGB 10.8* 03/05/2015   HCT 32.8* 03/05/2015   PLT 318 03/09/2015   PLT 373 03/05/2015   LYMPHOPCT 35.6 03/05/2015   MONOPCT 10.6 03/05/2015   EOSPCT 0.9 03/05/2015   BASOPCT 0.2 03/05/2015    CMP: Lab Results  Component Value Date   NA 141 03/09/2015   NA 133* 10/17/2014   K 3.7 03/09/2015   K 3.5 10/17/2014   CL 102 03/09/2015   CL 94* 10/17/2014   CO2 32 03/09/2015   CO2 32 03/08/2015   BUN 67* 03/09/2015   BUN 82* 03/08/2015   CREATININE 2.18* 03/09/2015   CREATININE 2.41* 03/08/2015   PROT 8.3* 03/04/2015   ALBUMIN 3.1* 03/04/2015   ALKPHOS 74 03/04/2015   AST 21 03/04/2015   ALT 15* 03/04/2015  .   Total Time in preparing paper work, data evaluation and todays exam - 35 minutes  Carl Perry M.D on 03/09/2015 at 9:13 AM

## 2015-03-09 NOTE — Progress Notes (Signed)
Pt is alert and oriented. IV fluids. Pt report pain once during shift. No other signs of distress noted. Will continue to monitor.

## 2015-03-09 NOTE — Progress Notes (Signed)
Subjective: pt's sob is gon, feels good,. wants to go home, able to drink plenty of fluids, emptying bladder well  Objective: Vital signs in last 24 hours: Temp:  [98.3 F (36.8 C)] 98.3 F (36.8 C) (05/01 0450) Pulse Rate:  [67] 67 (05/01 0450) BP: (153)/(64) 153/64 mmHg (05/01 0450) SpO2:  [97 %] 97 % (05/01 0450) Weight:  [125.646 kg (277 lb)-126.1 kg (278 lb)] 125.646 kg (277 lb) (05/01 0450) Weight change: -0.907 kg (-2 lb)    Intake/Output from previous day: 04/30 0701 - 05/01 0700 In: -  Out: 150 [Urine:150] Intake/Output this shift: Total I/O In: -  Out: 700 [Urine:700]  General appearance: alert, cooperative and appears stated age Head: Normocephalic, without obvious abnormality, atraumatic Eyes: conjunctivae/corneas clear. PERRL, EOM's intact. Fundi benign. Throat: lips, mucosa, and tongue normal; teeth and gums normal Neck: no adenopathy, no carotid bruit, no JVD, supple, symmetrical, trachea midline and thyroid not enlarged, symmetric, no tenderness/mass/nodules Resp: diminished breath sounds bilaterally and posterior - bilateral Chest wall: no tenderness Cardio: regular rate and rhythm, S1, S2 normal, no murmur, click, rub or gallop GI: soft, non-tender; bowel sounds normal; no masses,  no organomegaly Extremities: extremities normal, atraumatic, no cyanosis or edema Pulses: 2+ and symmetric Skin: excoriation - lower leg(s) bilateral and hyperpigmentation - lower leg(s) bilateral Lymph nodes: Cervical, supraclavicular, and axillary nodes normal.  Lab Results:  Recent Labs  03/09/15 0544  PLT 318   BMET  Recent Labs  03/08/15 0405 03/09/15 0544  NA  --  141  K  --  3.7  CL  --  102  CO2 32 32  GLUCOSE  --  132*  BUN 82* 67*  CREATININE 2.41* 2.18*  CALCIUM 9.3 9.3    Studies/Results: No results found.  Medications:  I have reviewed the patient's current medications. Prior to Admission:  Prescriptions prior to admission  Medication Sig  Dispense Refill Last Dose  . oxyCODONE-acetaminophen (PERCOCET/ROXICET) 5-325 MG per tablet Take 1 tablet by mouth 3 (three) times daily as needed for moderate pain.     Marland Kitchen acetaminophen-codeine (TYLENOL #3) 300-30 MG per tablet Take 1 tablet by mouth 2 (two) times daily.     Marland Kitchen amitriptyline (ELAVIL) 25 MG tablet Take 50 mg by mouth at bedtime.     Marland Kitchen aspirin 81 MG tablet Take 81 mg by mouth daily.     . baclofen (LIORESAL) 20 MG tablet Take 1 tablet by mouth at bedtime as needed (as needed for pain).      . carvedilol (COREG) 12.5 MG tablet Take 25 mg by mouth 2 (two) times daily with a meal.     . carvedilol (COREG) 6.25 MG tablet Take 1 tablet by mouth 2 (two) times daily.     Marland Kitchen docusate sodium (COLACE) 100 MG capsule Take 100 mg by mouth 2 (two) times daily as needed for mild constipation.     . furosemide (LASIX) 40 MG tablet Take 40 mg by mouth 2 (two) times daily.     Marland Kitchen gemfibrozil (LOPID) 600 MG tablet Take 1 tablet by mouth 2 (two) times daily.     Marland Kitchen glipiZIDE (GLUCOTROL) 10 MG tablet Take 1 tablet by mouth as needed.     . insulin aspart (NOVOLOG) 100 UNIT/ML injection Inject into the skin 3 (three) times daily before meals. As needed per sliding scale     . Insulin Detemir (LEVEMIR) 100 UNIT/ML Pen Inject 45 Units into the skin daily at 10 pm.      .  lactulose (CHRONULAC) 10 GM/15ML solution Take 15 mLs by mouth daily.     . Liraglutide 18 MG/3ML SOPN Inject 1.6 mg into the skin daily.     . magnesium oxide (MAG-OX) 400 MG tablet Take 400 mg by mouth 2 (two) times daily.     . metoCLOPramide (REGLAN) 5 MG/5ML solution Take 5 mg by mouth 3 (three) times daily as needed for nausea (indigestion and/or heartburn).      . metolazone (ZAROXOLYN) 5 MG tablet Take 1 tablet by mouth daily as needed.      . pantoprazole (PROTONIX) 40 MG tablet Take 40 mg by mouth daily.     . potassium chloride SA (K-DUR,KLOR-CON) 20 MEQ tablet Take 20 mEq by mouth daily.     . potassium chloride SA  (K-DUR,KLOR-CON) 20 MEQ tablet Take 1 tablet by mouth 2 (two) times daily.     . tamsulosin (FLOMAX) 0.4 MG CAPS capsule Take 0.4 mg by mouth at bedtime.      . Tiotropium Bromide Monohydrate 2.5 MCG/ACT AERS Inhale 2.5 mcg into the lungs daily.     Marland Kitchen. torsemide (DEMADEX) 100 MG tablet Take 1 tablet by mouth 2 (two) times daily. As needed     . traMADol (ULTRAM) 50 MG tablet Take 50 mg by mouth every 6 (six) hours as needed.     . venlafaxine (EFFEXOR) 37.5 MG tablet Take 37.5 mg by mouth 2 (two) times daily.     Marland Kitchen. venlafaxine XR (EFFEXOR-XR) 75 MG 24 hr capsule Take 1 capsule by mouth daily.      Scheduled: . amitriptyline  50 mg Oral QHS  . aspirin EC  81 mg Oral Daily  . carvedilol  6.25 mg Oral BID WC  . gemfibrozil  600 mg Oral BID AC  . heparin subcutaneous  5,000 Units Subcutaneous 3 times per day  . insulin aspart  0-10 Units Subcutaneous TID AC & HS  . insulin detemir  35 Units Subcutaneous QHS  . lactulose  10 g Oral Daily  . metoCLOPramide  5 mg Oral TID AC  . metolazone  5 mg Oral Daily  . tamsulosin  0.4 mg Oral QHS  . tiotropium  18 mcg Inhalation Daily  . venlafaxine XR  75 mg Oral Q breakfast   Continuous: . sodium chloride 75 mL/hr at 03/09/15 0000   NFA:[id number]:acetaminophen, baclofen, docusate sodium, ondansetron (ZOFRAN) IV, oxyCODONE-acetaminophen, pneumococcal 23 valent vaccine, sodium chloride  Assessment/Plan: 1.  Acute on chronic diastolic congestive heart failure with acute respiratory failure. SOB has resolved.  Chest x-ray does not show edema, but considering his morbid obesity this not reliable. We are continue thold off lasix 3 times a day as he is clinically feeling better. We will continue his metolazone, monitoring Is and Os. He does have acute renal failure and this needs to be closely monitored by nephrology as outpt, but hiskidney function is improving overall. 2.  Acute renal failure over chronic kidney disease with uremic symptoms. The patient has had nausea  and vomiting along with loss of appetite, some drowsiness secondary to uremia. BUN improved.  3.  Insulin-dependent diabetes mellitus with hypoglycemia. The patient has had vomiting and hypoglycemic episode 1 time at home. Reduced the dose on his Lantus to 35 units and put him on a sliding scale insulin.  4.  Hypertension. Continue home medications.  5.  Chronic obstructive pulmonary disease, stable.  6.  Anemia of chronic disease, stable.  LOS: 5 days   Kellsey Sansone 03/09/2015, 8:46 AM

## 2015-03-09 NOTE — H&P (Signed)
PATIENT NAME:  Carl Perry, [name] MR#:  [id number] DATE OF BIRTH:  29-Feb-1956  DATE OF ADMISSION:  03/04/2015  PRIMARY CARE PHYSICIAN:  Alba CoryKrichna Sowles, MD.    NEPHROLOGIST:  Lamont DowdySarath Kolluru, MD.    HISTORY OF PRESENTING ILLNESS: A 59 year old morbidly obese Caucasian male patient with history of CKD stage IV, COPD, chronic diastolic CHF, chronic anemia, presents to the Emergency Room from his primary care physician's office after he was found to have acute renal failure over his CKD stage IV. The patient does have some mild chronic lower extremity edema which has been getting worse over the past few days. He has also been drowsy, has had recurrent vomiting with the last episode 1 day prior. Due to the vomiting he also had some hypoglycemic episode as low as 20 at home which resolved with some sugar and juice at home. He went to see Dr. Wynelle LinkKolluru of nephrology yesterday and had some blood work done where his BUN was found to be 104 along with creatinine worsening to 3.66, and with symptoms of uremia he is being sent to the Emergency Room.   Presently he complains of worsening shortness of breath. He was on home oxygen in the past, but has been able to have saturations more than 90% on room air. But yesterday his saturations dropped to 83-85% and he put back himself on 2 liters oxygen at home. He has also had orthopnea, worsening lower extremity edema, and decreased urine output.   PAST MEDICAL HISTORY:  1. Chronic diastolic CHF.   2. Chronic lower extremity edema.  3. Hypertension.  4. Morbid obesity.  5. COPD.   6. CKD stage IV.  7. Obstructive sleep apnea on CPAP.  8. Diabetes mellitus type 2.  9. Diabetic retinopathy.  10. Diabetic neuropathy.  11. Anemia of chronic disease.  12. Hernia repair in the past.   SOCIAL HISTORY: The patient is married, lives with his wife. Has limited mobility. Used to smoke in the past but quit 3 years prior. No alcohol. No illicit drug use.   FAMILY HISTORY:  CKD  and breast cancer.   ALLERGIES: PENICILLIN AND STATINS.   REVIEW OF SYSTEMS:    CONSTITUTIONAL: Complains of fatigue, weight gain.  EYES: No blurred vision, pain, redness.   EARS, NOSE, AND THROAT: No tinnitus, ear pain, hearing loss.  RESPIRATORY: Has shortness of breath which is worsening along with COPD.   CARDIOVASCULAR: Has orthopnea, edema. No chest pain.  GASTROINTESTINAL: Has had nausea and vomiting, but no diarrhea or constipation.  GENITOURINARY: Has decreased urine output and CKD.   ENDOCRINE: No polyuria, nocturia, or thyroid problems.  HEMATOLOGIC AND LYMPHATIC: Has anemia of chronic disease. No easy bruising or bleeding.  INTEGUMENTARY: No acne, rash.  MUSCULOSKELETAL: Has chronic back pain, arthritis.  NEUROLOGIC: No focal numbness, weakness.  PSYCHIATRIC: He seems to have some depression.   HOME MEDICATIONS:  1. Acetaminophen/oxycodone 325/5 one tablet 3 times a day as needed.  2. Amitriptyline 25 mg 2 tablets daily.  3. Aspirin 81 mg daily.  4. Baclofen 20 mg daily as needed.  5. Coreg 6.25 mg oral 2 times a day.  6. Gemfibrozil 600 mg oral 2 times a day.  7. Glipizide 10 mg oral daily.  8. Potassium chloride 20 mEq 2 times a day.  9. Lactulose 15 mL orally once a day.  10. Levemir 45 units subcutaneous once a day.  11. Reglan 5 mL oral 3 times a day as needed.  12. Metolazone 5 mg  oral once a day.  13. NovoLog sliding scale.  14. Spiriva inhaled once a day.  15. Tamsulosin 0.4 mg daily.  16. Torsemide 100 mg 2 times a day.  17. Venlafaxine 75 mg oral once a day.  18. Victoza 1.6 mg subcutaneous once a day.   PHYSICAL EXAMINATION:  VITAL SIGNS: Temperature 97.8, pulse of 57, blood pressure 132/74, saturating 98% on 2 liters.  GENERAL APPEARANCE:  A morbidly obese Caucasian male patient sitting at the side of the bed with conversational dyspnea.  PSYCHIATRIC:  Alert, oriented x 3, pleasant.  HEENT: Atraumatic, normocephalic, normocephalic. Oral mucosa moist  and pink. Pallor positive. No icterus. Pupils bilaterally equal and reactive to light.  NECK: Supple. No thyromegaly. No palpable lymph nodes. Trachea midline. No carotid bruit, JVD.  CARDIOVASCULAR: S1, S2. No murmurs. Peripheral pulses decreased. Has 3 + edema in the lower extremities.  RESPIRATORY: Bilaterally decreased air entry. No crackles or wheezing heard.    GASTROINTESTINAL: Soft abdomen, distended, with a ventral hernia. Bowel sounds difficult to hear.  GENITOURINARY: No CVA tenderness or bladder distention.  SKIN: Warm and dry. No petechiae or rash.  MUSCULOSKELETAL: No joint swelling, redness, effusion of the large joints. Normal muscle tone.  NEUROLOGICAL: Motor strength 5/5 in upper and lower extremities. Sensation to fine touch intact all over.  LYMPHATIC: No cervical lymphadenopathy.   LABORATORY STUDIES: Show glucose of 106, BUN 99, creatinine 3.24, sodium 136, potassium 3.8, chloride 93. AST, ALT, alkaline phosphatase, bilirubin normal with WBC 6.5, hemoglobin 10.3, platelets of 367,000.   Chest x-ray done yesterday shows no active cardiopulmonary disease.   ASSESSMENT AND PLAN:  1.  Acute on chronic diastolic congestive heart failure with acute respiratory failure. At this point the patient has significant edema, orthopnea. Chest x-ray does not show any edema, but considering his morbid obesity this not reliable. We will give him a stat dose of IV Lasix 80 mg now and put him on 60 mg 3 times a day  IV. We will continue his metolazone, monitor Is and Os. He does have acute renal failure and this needs to be closely monitored. There is a chance he might need dialysis if there is no significant diuresis and if his kidney function worsens. His fluid retention is secondary to worsening kidney function.  2.  Acute renal failure over chronic kidney disease with uremic symptoms. The patient has had nausea and vomiting along with loss of appetite, some drowsiness secondary to uremia. We  will have to monitor how he does with Lasix and diuresis. If there is no improvement he will need dialysis. We will consult nephrology for further input.  3.  Insulin-dependent diabetes mellitus with hypoglycemia. The patient has had vomiting and hypoglycemic episode 1 time at home. At this point we will reduce the dose on his Lantus to 35 units and put him on a sliding scale insulin.  4.  Hypertension. Continue home medications.  5.  Chronic obstructive pulmonary disease, stable.  6.  Anemia of chronic disease, stable.  7.  Deep vein thrombosis prophylaxis with heparin.   CODE STATUS: Full code. The patient was DNR/DNI in the past, but at this point he tells me that he wants to be full code and his healthcare power of attorney is his wife.   TIME SPENT ON THIS CASE TODAY: 45 minutes including discussing his plan with Dr. Wynelle Link over the phone.    ____________________________ Molinda Bailiff. Giavanna Kang, MD srs:bu D: 03/04/2015 12:49:10 ET T: 03/04/2015 13:02:11 ET JOB#:  [id number]  cc: Shannia Jacuinde R. Purva Vessell, MD, <Dictator> Onnie Boer. Carlynn Purl, MD Laverda Sorenson, MD Orie Fisherman MD ELECTRONICALLY SIGNED 03/07/2015 16:25

## 2015-03-14 LAB — GLUCOSE, CAPILLARY: GLUCOSE-CAPILLARY: 102 mg/dL — AB (ref 70–99)

## 2015-03-26 NOTE — Patient Outreach (Signed)
[facility] Care Management  03/26/2015  Carl HoehnMichael E Perry [birth year] [id number]   Patient triggered RED on EMMI Heart Failure Dashboard.  Jodi Mourningose Pierzchala, RN is currently engaged in patients care, notification sent to  [facility] up with [facility].  Corrie MckusickLisa O. Sharlee BlewMoore, [facility] Care [facility] CM Assistant Phone: [phone] Fax: [phone]

## 2015-03-27 ENCOUNTER — Other Ambulatory Visit: Payer: Self-pay | Admitting: *Deleted

## 2015-03-27 NOTE — Patient Outreach (Signed)
Attempt made- (f/u on HF dashboard report 5/18- pt had red flag).   HIPPA compliant voice message left with contact number.   If no response, plan to call again.      Shayne Alkenose M.   Aubriella Perezgarcia RN CCM [facility] Care Management  [phone]

## 2015-03-28 ENCOUNTER — Other Ambulatory Visit: Payer: Self-pay | Admitting: *Deleted

## 2015-03-28 NOTE — Patient Outreach (Signed)
Received a return call from pt from one placed earlier by RN CM.  RN CM discussed with pt report received (red flag Emmi HF dashboard) to which pt states no problems with breathing,weights, that answers/questions asked not backing up what I am saying.   Discussed recent hospitalization (RN CM recently made aware- inpatient 4/26-5/1), to which pt states doing good, weighing every day.  Pt states reason for recent hospitalization was retaining water: f/u with kidney MD and result of lab work - MD admitted him in the hospital.    Pt states saw Dr. Carlynn PurlSowles and Kidney MD (Dr. Wynelle LinkKolluru) a week ago, to f/u with Clarisa Kindredina Hackney NP for his HF 5/31.   Pt states blood sugars doing good - 120,130.   Medications- pt states going to send everything through mail order except his Tylenol #3.  Pt states he ran out of one of my meds (Glipizide), to get through mail order.   RN CM [facility] as was discussed at last home visit (4/5) to which pt states does not need health coach at this time,nor home visit from RN CM needed at this time.   RN CM discussed with pt with recent hospital discharge, to f/u again 5/26  telephonically, check on status to which pt agreed.     Shayne Alkenose M.   Nanami Whitelaw RN CCM [facility] Care Management  [phone]

## 2015-03-31 ENCOUNTER — Other Ambulatory Visit: Payer: Self-pay | Admitting: *Deleted

## 2015-03-31 NOTE — Patient Outreach (Signed)
[facility] an [facility] that [facility] appointment  with RN CM on 5/26.    Called pt back, rescheduled f/u transition of care call for 5/27.       Shayne Alkenose M.   Pierzchala RN CCM [facility]  [phone]

## 2015-04-03 ENCOUNTER — Ambulatory Visit: Payer: Commercial Managed Care - HMO | Admitting: *Deleted

## 2015-04-04 ENCOUNTER — Other Ambulatory Visit: Payer: Self-pay | Admitting: *Deleted

## 2015-04-04 NOTE — Patient Outreach (Signed)
Transition of care call:   Pt states weights are okay, staying steady 265 lbs, ranges 263-265 lbs.  Pt states no swelling, sob, chest pain.   Pt states ran out of Glipizide, short on pills from pharmacy, out for 1.5 weeks.  Pt reports  he f/u with pharmacy, gave him the pills owed.  Pt states sugar this am was 157- had a snack during the night, was 165 yesterday in am, 153 last night.   Pt states he has been back on Glipizide for 3 days.   Pt states he is taking all of his other meds.   RN CM discussed with pt calling again 6/1, final transition of care call.    Shayne Alkenose M.   Yoan Sallade RN CCM [facility] Care Management  [phone]

## 2015-04-08 ENCOUNTER — Encounter: Payer: Self-pay | Admitting: Family

## 2015-04-08 ENCOUNTER — Ambulatory Visit: Payer: Commercial Managed Care - HMO | Attending: Family | Admitting: Family

## 2015-04-08 VITALS — BP 135/65 | HR 67 | Resp 20 | Ht 70.0 in | Wt 279.0 lb

## 2015-04-08 DIAGNOSIS — E119 Type 2 diabetes mellitus without complications: Secondary | ICD-10-CM | POA: Diagnosis not present

## 2015-04-08 DIAGNOSIS — F32A Depression, unspecified: Secondary | ICD-10-CM

## 2015-04-08 DIAGNOSIS — F329 Major depressive disorder, single episode, unspecified: Secondary | ICD-10-CM | POA: Diagnosis not present

## 2015-04-08 DIAGNOSIS — I509 Heart failure, unspecified: Secondary | ICD-10-CM | POA: Insufficient documentation

## 2015-04-08 DIAGNOSIS — N289 Disorder of kidney and ureter, unspecified: Secondary | ICD-10-CM | POA: Insufficient documentation

## 2015-04-08 DIAGNOSIS — I5032 Chronic diastolic (congestive) heart failure: Secondary | ICD-10-CM

## 2015-04-08 DIAGNOSIS — E1122 Type 2 diabetes mellitus with diabetic chronic kidney disease: Secondary | ICD-10-CM

## 2015-04-08 NOTE — Progress Notes (Signed)
Subjective:    Patient ID: Carl Perry, male    DOB: 1955/12/23, 59 y.o.   MRN: [id number]  Cough This is a new problem. The current episode started in the past 7 days. The problem has been gradually improving. The problem occurs every few hours. The cough is productive of sputum. Pertinent negatives include no chest pain, headaches, postnasal drip, sore throat or shortness of breath. Nothing aggravates the symptoms. He has tried ipratropium inhaler and a beta-agonist inhaler for the symptoms. The treatment provided mild relief. His past medical history is significant for COPD.  Congestive Heart Failure Presents for follow-up visit. The disease course has been stable. Associated symptoms include fatigue and muscle weakness (in legs). Pertinent negatives include no abdominal pain, chest pain, edema, palpitations or shortness of breath. The symptoms have been improving. Past treatments include beta blockers and salt and fluid restriction. The treatment provided significant relief. Compliance with prior treatments has been good. His past medical history is significant for DM.      Review of Systems  Constitutional: Positive for fatigue. Negative for appetite change.  HENT: Negative for congestion, postnasal drip, sore throat and trouble swallowing.   Eyes: Negative.   Respiratory: Positive for cough. Negative for chest tightness and shortness of breath.   Cardiovascular: Negative for chest pain and palpitations.  Gastrointestinal: Positive for abdominal distention. Negative for abdominal pain.  Endocrine: Negative.   Genitourinary: Negative.   Musculoskeletal: Positive for muscle weakness (in legs). Negative for back pain and neck pain.  Skin: Negative.   Allergic/Immunologic: Negative.   Neurological: Positive for dizziness, weakness and numbness (both hands although intermittently). Negative for headaches.  Hematological: Negative.   Psychiatric/Behavioral: Positive for dysphoric  mood. Negative for sleep disturbance.       Objective:   Physical Exam  Constitutional: He is oriented to person, place, and time. He appears well-developed and well-nourished.  HENT:  Head: Normocephalic and atraumatic.  Eyes: Conjunctivae are normal. Pupils are equal, round, and reactive to light.  Neck: Normal range of motion. Neck supple.  Cardiovascular: Normal rate and regular rhythm.   Pulmonary/Chest: Effort normal and breath sounds normal. He has no wheezes. He has no rales.  Abdominal: Soft. He exhibits distension. There is no tenderness.  Musculoskeletal: He exhibits no edema or tenderness.  Neurological: He is alert and oriented to person, place, and time.  Skin: Skin is warm and dry.  Psychiatric: His behavior is normal. Thought content normal.  Nursing note and vitals reviewed.         Assessment & Plan:  1: Chronic heart failure with preserved ejection fraction- Patient presents without any shortness of breath. He reports sleeping well on 3 flat pillows. He does get tired easily and feels like his legs are weak. Has tried going to the gym but is unsure of what all he can do. Discussed pulmonary rehab with him and a brochure was given to him to discuss with his PCP regarding this. He continues to weigh daily and reports a stable weight. He has lost 4 pounds since he was here last. Reminded to call for an overnight weight gain of >2 pounds or a weekly weight gain of >5 pounds. He says that he occasionally adds nu salt to his foods. He says that he doesn't like Mrs. Dash seasonings. Reminded him to closely follow a 2000mg  sodium diet. 2: Diabetes- He says that his fasting glucose this morning was 160 and he follows a sliding scale for his novolog. Follows  closely with endocrinologist. 3: Depression- He is currently being treated with venlafaxine and feels like his depressive symptoms are because his legs are weak and he can't do the things that he likes to do. Encouraged him  to speak with his PCP regarding this as his dosage might need to be adjusted. Emotional support offered.  4: Renal insufficiency- Patient follows closely with nephrology.  Return in 3 months or sooner for any questions/problems before then.

## 2015-04-08 NOTE — Patient Instructions (Signed)
Continue weighing daily and call for an overnight weight gain of >2 pounds or a weekly weight gain of >5 pounds.   Continue to follow a low sodium diet.  

## 2015-04-22 ENCOUNTER — Other Ambulatory Visit: Payer: Self-pay | Admitting: *Deleted

## 2015-04-22 ENCOUNTER — Telehealth: Payer: Self-pay | Admitting: Family Medicine

## 2015-04-22 DIAGNOSIS — I5032 Chronic diastolic (congestive) heart failure: Secondary | ICD-10-CM

## 2015-04-22 NOTE — Telephone Encounter (Signed)
Carl Perry, wife of patient called stating patient needs referrals to ENT and Dr Sherryll Burger at [facility].

## 2015-04-22 NOTE — Patient Outreach (Signed)
Attempt made to contact pt, assess if case management needs.   This RN CM f/u on pt last month for Heart failure dashboard report 5/18 as well as transition of care (discharged 5/1)- view in Epic shows no readmission in 30 days.  Final transition of care call was not done 6/1.    HIPPA compliant voice message left with contact number.    Plan to f/u again if no response.   Shayne Alken.   Pierzchala RN CCM [facility] Care Management  [phone]

## 2015-04-23 NOTE — Telephone Encounter (Signed)
Put in referral for [facility] for Dr. Jenne Campus at [facility] ENT and Dr. Sherryll Burger, good for 6 months and 6 visits. Patient has been notified.

## 2015-04-24 ENCOUNTER — Encounter: Payer: Self-pay | Admitting: *Deleted

## 2015-04-24 ENCOUNTER — Other Ambulatory Visit: Payer: Self-pay | Admitting: *Deleted

## 2015-04-24 NOTE — Patient Outreach (Signed)
Discharge phone call:   Spoke with pt, check on  status, assess if case management needs since  hospital discharge 5/1.  Pt states weights are the same, range 263- 265 lbs, no sob or swelling since last hospitalization.  Pt states wife said sugars are fine.   Pt states he saw diabetic MD- said sugars are staying the same.  Pt states he is keeping his sugars <150.   RN CM discussed with pt plan to close case, no case management needs.    Plan to close case. Plan to inform Dr. Carlynn Purl  Followed pt  for transition of care, close case as no further intervention needed.     Shayne Alken.   Pierzchala RN CCM [facility] Care Management  [phone]

## 2015-04-29 NOTE — Patient Outreach (Signed)
[facility]) Care Management  04/29/2015  Carl Perry July 30, 1956 [id number]   Notification from Jodi Mourning, RN to close case due consumer accessed no further inventions needed.  [name]. [name] [facility] Care Management [facility] CM Assistant Phone: [phone] Fax: [phone]

## 2015-04-30 ENCOUNTER — Encounter (INDEPENDENT_AMBULATORY_CARE_PROVIDER_SITE_OTHER): Payer: Self-pay

## 2015-04-30 ENCOUNTER — Encounter: Payer: Self-pay | Admitting: Cardiovascular Disease

## 2015-04-30 ENCOUNTER — Telehealth: Payer: Self-pay | Admitting: Family Medicine

## 2015-04-30 ENCOUNTER — Ambulatory Visit (INDEPENDENT_AMBULATORY_CARE_PROVIDER_SITE_OTHER): Payer: Commercial Managed Care - HMO | Admitting: Cardiovascular Disease

## 2015-04-30 ENCOUNTER — Telehealth: Payer: Self-pay

## 2015-04-30 VITALS — BP 148/78 | HR 69 | Ht 70.0 in | Wt 296.2 lb

## 2015-04-30 DIAGNOSIS — E1122 Type 2 diabetes mellitus with diabetic chronic kidney disease: Secondary | ICD-10-CM

## 2015-04-30 DIAGNOSIS — N2889 Other specified disorders of kidney and ureter: Secondary | ICD-10-CM

## 2015-04-30 DIAGNOSIS — G4733 Obstructive sleep apnea (adult) (pediatric): Secondary | ICD-10-CM

## 2015-04-30 DIAGNOSIS — N189 Chronic kidney disease, unspecified: Secondary | ICD-10-CM

## 2015-04-30 DIAGNOSIS — I5032 Chronic diastolic (congestive) heart failure: Secondary | ICD-10-CM | POA: Diagnosis not present

## 2015-04-30 DIAGNOSIS — E0842 Diabetes mellitus due to underlying condition with diabetic polyneuropathy: Secondary | ICD-10-CM

## 2015-04-30 DIAGNOSIS — N184 Chronic kidney disease, stage 4 (severe): Secondary | ICD-10-CM | POA: Insufficient documentation

## 2015-04-30 NOTE — Assessment & Plan Note (Signed)
He reports that he uses his CPAP when he needs to, not on a regular basis Unclear if this is contributing to his symptoms

## 2015-04-30 NOTE — Progress Notes (Signed)
Patient ID: Carl Perry, male    DOB: June 19, 1956, 59 y.o.   MRN: [id number]  HPI Comments: [name] is a 59 year old gentleman with morbid obesity, chronic renal insufficiency, long history of smoking for 35-40 years who stopped 4 years ago, diabetes, obstructive sleep apnea, neuropathy who presents to establish care in the cardiac clinic.  Recent hospitalization April 2016 for acute on chronic diastolic CHF Since his discharge, he has been maintained on torsemide 100 mg twice a day, occasional metolazone for leg swelling. Creatinine typically runs 2-3, followed by Dr. Ronn Melena. He reports blood pressure ranges from 120s up to 150s. Leg swelling recently has been well-controlled. He is relatively sedentary, having difficulty losing weight. Reports legs are weak, chronic issue  Recent blood work reviewed with him showing creatinine 2.41, BUN 56, GFR 29 Hematocrit 33, hemoglobin A1c 6.5  EKG on today's visit shows normal sinus rhythm with rate 69 bpm, no significant ST or T-wave changes   Allergies  Allergen Reactions  . Statins Swelling and Other (See Comments)    "Took strength out of legs"; rhabdomyolisis  . Ivp Dye [Iodinated Diagnostic Agents]   . Penicillins Rash    Current Outpatient Prescriptions on File Prior to Visit  Medication Sig Dispense Refill  . acetaminophen-codeine (TYLENOL #3) 300-30 MG per tablet Take 1 tablet by mouth 2 (two) times daily.    Marland Kitchen amitriptyline (ELAVIL) 50 MG tablet Take 1 tablet (50 mg total) by mouth at bedtime. 30 tablet 0  . aspirin 81 MG tablet Take 81 mg by mouth daily.    . baclofen (LIORESAL) 20 MG tablet Take 1 tablet by mouth at bedtime as needed (as needed for pain).     . carvedilol (COREG) 6.25 MG tablet Take 1 tablet by mouth 2 (two) times daily.    Marland Kitchen docusate sodium (COLACE) 100 MG capsule Take 100 mg by mouth 2 (two) times daily as needed for mild constipation.    Marland Kitchen gemfibrozil (LOPID) 600 MG tablet Take 1 tablet by mouth 2 (two)  times daily.    Marland Kitchen glipiZIDE (GLUCOTROL) 10 MG tablet Take 1 tablet by mouth daily before breakfast.     . insulin aspart (NOVOLOG) 100 UNIT/ML injection Inject into the skin 3 (three) times daily before meals. As needed per sliding scale    . Insulin Detemir (LEVEMIR) 100 UNIT/ML Pen Inject 45 Units into the skin daily at 10 pm.     . lactulose (CHRONULAC) 10 GM/15ML solution Take 15 mLs by mouth 2 (two) times daily.     . Liraglutide 18 MG/3ML SOPN Inject 1.6 mg into the skin daily.    . metoCLOPramide (REGLAN) 5 MG/5ML solution Take 5 mg by mouth 3 (three) times daily as needed for nausea (indigestion and/or heartburn).     . metolazone (ZAROXOLYN) 5 MG tablet Take 1 tablet by mouth daily as needed.     . tamsulosin (FLOMAX) 0.4 MG CAPS capsule Take 0.4 mg by mouth at bedtime.     . Tiotropium Bromide Monohydrate 2.5 MCG/ACT AERS Inhale 2.5 mcg into the lungs as needed.     . venlafaxine XR (EFFEXOR-XR) 75 MG 24 hr capsule Take 1 capsule by mouth daily.     No current facility-administered medications on file prior to visit.    Past Medical History  Diagnosis Date  . COPD (chronic obstructive pulmonary disease)   . Diabetes mellitus without complication   . Hypertension   . CKD (chronic kidney disease)   . Sleep  apnea   . Rhabdomyolysis   . Hyperlipidemia   . Morbid obesity   . Depression   . CHF (congestive heart failure)     Past Surgical History  Procedure Laterality Date  . Toe amputation Right   . Foot surgery Right   . Tonsillectomy    . Abdominal hernia repair  1989  . Cardiac catheterization      [facility] no stents    Social History  reports that he quit smoking about 4 years ago. His smoking use included Cigarettes. He has a 70 pack-year smoking history. He has never used smokeless tobacco. He reports that he does not drink alcohol or use illicit drugs.  Family History family history includes Cancer in his mother; Diabetes in his maternal grandmother and sister;  Heart disease in his sister; Kidney disease in his sister.   Review of Systems  HENT: Negative.   Eyes: Negative.   Respiratory: Positive for shortness of breath.   Cardiovascular: Positive for leg swelling.  Gastrointestinal: Negative.   Endocrine: Negative.   Musculoskeletal: Positive for gait problem.  Skin: Negative.   Allergic/Immunologic: Negative.   Neurological: Positive for weakness.  Hematological: Negative.   Psychiatric/Behavioral: Negative.   All other systems reviewed and are negative.   BP 148/78 mmHg  Pulse 69  Ht 5\' 10"  (1.778 m)  Wt 296 lb 4 oz (134.378 kg)  BMI 42.51 kg/m2   Physical Exam  Constitutional: He is oriented to person, place, and time. He appears well-developed and well-nourished.  Obese  HENT:  Head: Normocephalic.  Nose: Nose normal.  Mouth/Throat: Oropharynx is clear and moist.  Eyes: Conjunctivae are normal. Pupils are equal, round, and reactive to light.  Neck: Normal range of motion. Neck supple. No JVD present.  Cardiovascular: Normal rate, regular rhythm, normal heart sounds and intact distal pulses.  Exam reveals no gallop and no friction rub.   No murmur heard. Minimal edema, signs of chronic stasis to below the knees  Pulmonary/Chest: Effort normal and breath sounds normal. No respiratory distress. He has no wheezes. He has no rales. He exhibits no tenderness.  Abdominal: Soft. Bowel sounds are normal. He exhibits no distension. There is no tenderness.  Musculoskeletal: Normal range of motion. He exhibits no edema or tenderness.  Lymphadenopathy:    He has no cervical adenopathy.  Neurological: He is alert and oriented to person, place, and time. Coordination normal.  Skin: Skin is warm and dry. No rash noted. No erythema.  Psychiatric: He has a normal mood and affect. His behavior is normal. Judgment and thought content normal.

## 2015-04-30 NOTE — Assessment & Plan Note (Signed)
Unclear to what extent his renal numbers are exacerbated by overdiuresis. Aggressive diuresis required given his diastolic dysfunction, sleep apnea. Without aggressive torsemide, he has abdominal swelling, leg swelling.

## 2015-04-30 NOTE — Telephone Encounter (Signed)
Pt called needing a referral to see Dr. Orland Jarred @ [facility]  Pt stated that he is scheduled to see him in July but there office needs a referral order

## 2015-04-30 NOTE — Telephone Encounter (Signed)
Checked the [facility] portal, referral was done Auth # [id number] Start - 01/07/2015 Expires - 07/06/2015 Dr. Orland Jarred

## 2015-04-30 NOTE — Assessment & Plan Note (Signed)
Appears to be euvolemic, possibly even mildly dry. Weight at home 265 pounds He takes metolazone for any leg edema. When symptoms get bad, he develops abdominal swelling. He is noncompliant with his CPAP which may be contributing to his symptoms. Appears to have changed his diet, moderate his fluid intake.

## 2015-04-30 NOTE — Assessment & Plan Note (Signed)
This is a major contributor to his fluid status. We'll continue dietary education on each visit

## 2015-04-30 NOTE — Patient Instructions (Signed)
You are doing well. No medication changes were made.  Please call us if you have new issues that need to be addressed before your next appt.  Your physician wants you to follow-up in: 6 months.  You will receive a reminder letter in the mail two months in advance. If you don't receive a letter, please call our office to schedule the follow-up appointment.   

## 2015-04-30 NOTE — Assessment & Plan Note (Signed)
Hemoglobin A1c 6.5. Managed by endocrine

## 2015-04-30 NOTE — Telephone Encounter (Signed)
Pt needs referral

## 2015-05-01 ENCOUNTER — Telehealth: Payer: Self-pay | Admitting: *Deleted

## 2015-05-01 NOTE — Telephone Encounter (Signed)
This encounter was created in error - please disregard.

## 2015-05-01 NOTE — Telephone Encounter (Signed)
Spoke w/ pt.  He states that he brought over all results and would like to know if he needs any other tests done.

## 2015-05-01 NOTE — Addendum Note (Signed)
Addended by: Rhea Belton R on: 05/01/2015 11:50 AM   Modules accepted: Level of Service, SmartSet

## 2015-05-01 NOTE — Telephone Encounter (Signed)
Attempted to contact pt.  Sounded like someone answered and then hung up.  Will attempt again later.

## 2015-05-01 NOTE — Telephone Encounter (Signed)
Pt is asking what labs did he need to get from [facility].   Please advise.

## 2015-06-17 ENCOUNTER — Encounter (INDEPENDENT_AMBULATORY_CARE_PROVIDER_SITE_OTHER): Payer: Self-pay

## 2015-06-17 ENCOUNTER — Encounter: Payer: Self-pay | Admitting: Family Medicine

## 2015-06-17 ENCOUNTER — Ambulatory Visit (INDEPENDENT_AMBULATORY_CARE_PROVIDER_SITE_OTHER): Payer: Commercial Managed Care - HMO | Admitting: Family Medicine

## 2015-06-17 VITALS — BP 118/62 | HR 72 | Temp 98.6°F | Resp 16 | Ht 70.0 in | Wt 280.6 lb

## 2015-06-17 DIAGNOSIS — J449 Chronic obstructive pulmonary disease, unspecified: Secondary | ICD-10-CM | POA: Diagnosis not present

## 2015-06-17 DIAGNOSIS — E785 Hyperlipidemia, unspecified: Secondary | ICD-10-CM | POA: Diagnosis not present

## 2015-06-17 DIAGNOSIS — K439 Ventral hernia without obstruction or gangrene: Secondary | ICD-10-CM | POA: Insufficient documentation

## 2015-06-17 DIAGNOSIS — I5032 Chronic diastolic (congestive) heart failure: Secondary | ICD-10-CM | POA: Diagnosis not present

## 2015-06-17 DIAGNOSIS — E1140 Type 2 diabetes mellitus with diabetic neuropathy, unspecified: Secondary | ICD-10-CM | POA: Diagnosis not present

## 2015-06-17 DIAGNOSIS — F329 Major depressive disorder, single episode, unspecified: Secondary | ICD-10-CM

## 2015-06-17 DIAGNOSIS — I739 Peripheral vascular disease, unspecified: Secondary | ICD-10-CM | POA: Insufficient documentation

## 2015-06-17 DIAGNOSIS — Z8739 Personal history of other diseases of the musculoskeletal system and connective tissue: Secondary | ICD-10-CM | POA: Insufficient documentation

## 2015-06-17 DIAGNOSIS — Z89431 Acquired absence of right foot: Secondary | ICD-10-CM | POA: Diagnosis not present

## 2015-06-17 DIAGNOSIS — F339 Major depressive disorder, recurrent, unspecified: Secondary | ICD-10-CM | POA: Insufficient documentation

## 2015-06-17 DIAGNOSIS — F32A Depression, unspecified: Secondary | ICD-10-CM

## 2015-06-17 DIAGNOSIS — Z87891 Personal history of nicotine dependence: Secondary | ICD-10-CM | POA: Insufficient documentation

## 2015-06-17 DIAGNOSIS — Z89412 Acquired absence of left great toe: Secondary | ICD-10-CM | POA: Insufficient documentation

## 2015-06-17 DIAGNOSIS — Z87898 Personal history of other specified conditions: Secondary | ICD-10-CM | POA: Insufficient documentation

## 2015-06-17 NOTE — Progress Notes (Signed)
Name: Carl Perry   MRN: [id number]    DOB: Sep 06, 1956   Date:06/17/2015       Progress Note  Subjective  Chief Complaint  Chief Complaint  Patient presents with  . Diabetes  . Hypertension    pt states meds making him dizzy  . Chronic Kidney Disease  . Dizziness    HPI  Chronic Diastolic Heart Failure: he has lost weight since last visit, he has SOB with mild to moderate activity.  He has been compliant with medication, but coreg has been causing some dizziness because bp is lower than usual. He has orthopnea, but down to 2 pillows instead of 4. Edema lower extremity is not as severe now.   HTN: bp is towards low end of normal, and he has been getting dizzy, advised to discuss it with Dr. Berneta Sages and he may decrease dose of coreg  CKI: stage IV - stable, sees nephrologist  COPD moderate: he has quit smoking, he denies a daily cough, has SOB with moderate activity, not taking any medications at this time for COPD, except for Prn inhalers.  DMII: with neuropathy and CKI: he is seeing Dr. Renae Fickle, glucose at home has been at goal, denies hypoglycemic episodes, no polyphagia, but has episodes of polydipsia, Taking medications as recommended. Sees Dr. Orland Jarred ( s/p right big toe amputation), he has neuropathy and has constant paresthesia of both feet and times of his hands. He was on Gabapentin but he stopped on his own because it was causing severe dizziness  Depression: he is feeling better, taking Effexor and denies side effects of medication.   Patient Active Problem List   Diagnosis Date Noted  . History of rhabdomyolysis 06/17/2015  . History of syncope 06/17/2015  . PVD (peripheral vascular disease) 06/17/2015  . Ventral hernia 06/17/2015  . Hyperlipidemia 06/17/2015  . History of amputation of right foot 06/17/2015  . COPD, moderate 06/17/2015  . History of tobacco use 06/17/2015  . Depression 06/17/2015  . OSA (obstructive sleep apnea) 04/30/2015  . Morbid obesity  04/30/2015  . Chronic renal insufficiency, stage IV (severe) 04/30/2015  . Chronic diastolic heart failure 02/12/2015  . Type 2 diabetes mellitus with diabetic neuropathy 04/16/2014    Past Surgical History  Procedure Laterality Date  . Toe amputation Right   . Foot surgery Right   . Tonsillectomy    . Abdominal hernia repair  1989  . Cardiac catheterization      [facility] no stents    Family History  Problem Relation Age of Onset  . Cancer Mother     Pancreatic  . Diabetes Sister   . Kidney disease Sister   . Diabetes Maternal Grandmother   . Heart disease Sister     Heart Failure    History   Social History  . Marital Status: Married    Spouse Name: N/A  . Number of Children: N/A  . Years of Education: N/A   Occupational History  . Not on file.   Social History Main Topics  . Smoking status: Former Smoker -- 2.00 packs/day for 35 years    Types: Cigarettes    Quit date: 11/08/2010  . Smokeless tobacco: Never Used  . Alcohol Use: No  . Drug Use: No  . Sexual Activity: Yes   Other Topics Concern  . Not on file   Social History Narrative     Current outpatient prescriptions:  .  acetaminophen-codeine (TYLENOL #3) 300-30 MG per tablet, Take 1 tablet by mouth  2 (two) times daily., Disp: , Rfl:  .  amitriptyline (ELAVIL) 50 MG tablet, Take 1 tablet (50 mg total) by mouth at bedtime., Disp: 30 tablet, Rfl: 0 .  aspirin 81 MG tablet, Take 81 mg by mouth daily., Disp: , Rfl:  .  baclofen (LIORESAL) 20 MG tablet, Take 1 tablet by mouth at bedtime as needed (as needed for pain). , Disp: , Rfl:  .  carvedilol (COREG) 6.25 MG tablet, Take 1 tablet by mouth 2 (two) times daily., Disp: , Rfl:  .  docusate sodium (COLACE) 100 MG capsule, Take 100 mg by mouth 2 (two) times daily as needed for mild constipation., Disp: , Rfl:  .  gabapentin (NEURONTIN) 300 MG capsule, Take 300 mg by mouth 2 (two) times daily., Disp: , Rfl:  .  gemfibrozil (LOPID) 600 MG tablet, Take 1 tablet  by mouth 2 (two) times daily., Disp: , Rfl:  .  glipiZIDE (GLUCOTROL) 10 MG tablet, Take 1 tablet by mouth daily before breakfast. , Disp: , Rfl:  .  insulin aspart (NOVOLOG) 100 UNIT/ML injection, Inject into the skin 3 (three) times daily before meals. As needed per sliding scale, Disp: , Rfl:  .  Insulin Detemir (LEVEMIR) 100 UNIT/ML Pen, Inject 45 Units into the skin daily at 10 pm. , Disp: , Rfl:  .  lactulose (CHRONULAC) 10 GM/15ML solution, Take 15 mLs by mouth 2 (two) times daily. , Disp: , Rfl:  .  metoCLOPramide (REGLAN) 5 MG/5ML solution, Take 5 mg by mouth 3 (three) times daily as needed for nausea (indigestion and/or heartburn). , Disp: , Rfl:  .  metolazone (ZAROXOLYN) 5 MG tablet, Take 1 tablet by mouth daily as needed. , Disp: , Rfl:  .  potassium chloride SA (K-DUR,KLOR-CON) 20 MEQ tablet, Take 20 mEq by mouth 2 (two) times daily., Disp: , Rfl:  .  tamsulosin (FLOMAX) 0.4 MG CAPS capsule, Take 0.4 mg by mouth at bedtime. , Disp: , Rfl:  .  Tiotropium Bromide Monohydrate 2.5 MCG/ACT AERS, Inhale 2.5 mcg into the lungs as needed. , Disp: , Rfl:  .  torsemide (DEMADEX) 100 MG tablet, Take 100 mg by mouth 2 (two) times daily., Disp: , Rfl:  .  venlafaxine XR (EFFEXOR-XR) 75 MG 24 hr capsule, Take 1 capsule by mouth daily., Disp: , Rfl:  .  Liraglutide 18 MG/3ML SOPN, Inject 1.6 mg into the skin daily., Disp: , Rfl:   Allergies  Allergen Reactions  . Statins Swelling and Other (See Comments)    "Took strength out of legs"; rhabdomyolisis  . Ivp Dye [Iodinated Diagnostic Agents]   . Penicillins Rash     ROS  Constitutional: Negative for fever , positive for weight change.  Respiratory: Negative for cough positive  shortness of breath.   Cardiovascular: Negative for chest pain or palpitations.  Gastrointestinal: Negative for abdominal pain, no bowel changes.  Musculoskeletal: Negative for gait problem or joint swelling.  Skin: Negative for rash.  Neurological: positive for  dizziness negative for headache.  No other specific complaints in a complete review of systems (except as listed in HPI above).  Objective  Filed Vitals:   06/17/15 0919  BP: 118/62  Pulse: 72  Temp: 98.6 F (37 C)  TempSrc: Oral  Resp: 16  Height: 5\' 10"  (1.778 m)  Weight: 280 lb 9.6 oz (127.279 kg)  SpO2: 96%    Body mass index is 40.26 kg/(m^2).  Physical Exam  Constitutional: Patient appears well-developed and well-nourished. Obese  No  distress.  HEENT: head atraumatic, normocephalic, pupils equal and reactive to light, neck supple, throat within normal limits Cardiovascular: Normal rate, regular rhythm and normal heart sounds.  No murmur heard. BLE edema 1 plus  Pulmonary/Chest: Effort normal and bi-basilar crackles  No respiratory distress. Abdominal: Soft.  There is no tenderness. Ventral hernia Psychiatric: Patient has a normal mood and affect. behavior is normal. Judgment and thought content normal.   PHQ2/9: Depression screen [facility] 2/9 06/17/2015 04/08/2015  Decreased Interest 0 1  Down, Depressed, Hopeless 0 3  PHQ - 2 Score 0 4  Altered sleeping - 1  Tired, decreased energy - 0  Change in appetite - 1  Feeling bad or failure about yourself  - 1  Trouble concentrating - 0  Moving slowly or fidgety/restless - 0  Suicidal thoughts - 0  PHQ-9 Score - 7  Difficult doing work/chores - Not difficult at all     Fall Risk: Fall Risk  06/17/2015 04/08/2015 02/12/2015  Falls in the past year? Yes Yes Yes  Number falls in past yr: 1 2 or more 2 or more  Injury with Fall? No No -  Risk Factor Category  - High Fall Risk -  Risk for fall due to : - History of fall(s);Impaired balance/gait History of fall(s)  Follow up - Falls prevention discussed -      Assessment & Plan  1. Chronic diastolic heart failure Stable, advised to continue Coreg for now, and discuss it with Dr. Berneta Sages, may need to decrease dose to 3.125 because of dizziness, but bp is finally at goal. Needs  to resume CPAP machine   2. Hyperlipidemia Not on statin because of history of rhabdomyolysis   3. COPD, moderate Not using inhalers, quit smoking and is feeling better  4. Depression Doing better, continue medication  5. History of amputation of right foot Secondary to DM  6. Type 2 diabetes mellitus with diabetic neuropathy Explained importance of PCV , flu shot and eye exam yearly, he states he can't afford it, continue follow up with Dr. Orland Jarred - podiatrist and Dr. Renae Fickle - Endo, seeing neurologist - Dr. Sherryll Burger for neuropathy

## 2015-06-20 ENCOUNTER — Encounter: Payer: Self-pay | Admitting: Family Medicine

## 2015-06-20 DIAGNOSIS — R809 Proteinuria, unspecified: Secondary | ICD-10-CM | POA: Insufficient documentation

## 2015-06-20 DIAGNOSIS — N2581 Secondary hyperparathyroidism of renal origin: Secondary | ICD-10-CM | POA: Insufficient documentation

## 2015-06-20 DIAGNOSIS — D638 Anemia in other chronic diseases classified elsewhere: Secondary | ICD-10-CM | POA: Insufficient documentation

## 2015-06-27 ENCOUNTER — Encounter: Payer: Self-pay | Admitting: Family Medicine

## 2015-07-09 ENCOUNTER — Ambulatory Visit: Payer: Commercial Managed Care - HMO | Attending: Family | Admitting: Family

## 2015-07-09 ENCOUNTER — Encounter: Payer: Self-pay | Admitting: Family

## 2015-07-09 VITALS — BP 131/54 | HR 70 | Resp 20 | Ht 70.0 in | Wt 290.0 lb

## 2015-07-09 DIAGNOSIS — E785 Hyperlipidemia, unspecified: Secondary | ICD-10-CM | POA: Diagnosis not present

## 2015-07-09 DIAGNOSIS — Z6841 Body Mass Index (BMI) 40.0 and over, adult: Secondary | ICD-10-CM | POA: Insufficient documentation

## 2015-07-09 DIAGNOSIS — Z79899 Other long term (current) drug therapy: Secondary | ICD-10-CM | POA: Diagnosis not present

## 2015-07-09 DIAGNOSIS — J449 Chronic obstructive pulmonary disease, unspecified: Secondary | ICD-10-CM | POA: Diagnosis not present

## 2015-07-09 DIAGNOSIS — Z87891 Personal history of nicotine dependence: Secondary | ICD-10-CM | POA: Diagnosis not present

## 2015-07-09 DIAGNOSIS — Z7982 Long term (current) use of aspirin: Secondary | ICD-10-CM | POA: Insufficient documentation

## 2015-07-09 DIAGNOSIS — Z794 Long term (current) use of insulin: Secondary | ICD-10-CM | POA: Insufficient documentation

## 2015-07-09 DIAGNOSIS — F32A Depression, unspecified: Secondary | ICD-10-CM

## 2015-07-09 DIAGNOSIS — G4730 Sleep apnea, unspecified: Secondary | ICD-10-CM | POA: Diagnosis not present

## 2015-07-09 DIAGNOSIS — N189 Chronic kidney disease, unspecified: Secondary | ICD-10-CM | POA: Diagnosis not present

## 2015-07-09 DIAGNOSIS — F329 Major depressive disorder, single episode, unspecified: Secondary | ICD-10-CM | POA: Insufficient documentation

## 2015-07-09 DIAGNOSIS — E1140 Type 2 diabetes mellitus with diabetic neuropathy, unspecified: Secondary | ICD-10-CM

## 2015-07-09 DIAGNOSIS — M6282 Rhabdomyolysis: Secondary | ICD-10-CM | POA: Diagnosis not present

## 2015-07-09 DIAGNOSIS — I5032 Chronic diastolic (congestive) heart failure: Secondary | ICD-10-CM | POA: Insufficient documentation

## 2015-07-09 DIAGNOSIS — E119 Type 2 diabetes mellitus without complications: Secondary | ICD-10-CM | POA: Insufficient documentation

## 2015-07-09 DIAGNOSIS — I129 Hypertensive chronic kidney disease with stage 1 through stage 4 chronic kidney disease, or unspecified chronic kidney disease: Secondary | ICD-10-CM | POA: Diagnosis not present

## 2015-07-09 DIAGNOSIS — G4733 Obstructive sleep apnea (adult) (pediatric): Secondary | ICD-10-CM

## 2015-07-09 NOTE — Progress Notes (Signed)
Subjective:    Patient ID: Carl Perry, male    DOB: 05/05/1956, 59 y.o.   MRN: [id number]  Congestive Heart Failure Presents for follow-up visit. The disease course has been stable. Associated symptoms include edema, fatigue and muscle weakness (both legs). Pertinent negatives include no abdominal pain, chest pain, chest pressure, orthopnea, palpitations or shortness of breath. The symptoms have been stable. Past treatments include beta blockers and salt and fluid restriction. The treatment provided moderate relief. Compliance with prior treatments has been good. His past medical history is significant for chronic lung disease, DM and HTN. He has one 1st degree relative with heart disease. Compliance with total regimen is 76-100%.  Other This is a chronic (depression) problem. The current episode started more than 1 year ago. The problem occurs daily. The problem has been gradually worsening. Associated symptoms include arthralgias (legs hurt), congestion, fatigue, numbness (and tingling in hands and feet) and weakness. Pertinent negatives include no abdominal pain, chest pain, nausea or sore throat. The symptoms are aggravated by stress. Treatments tried: antidepressants. The treatment provided mild relief.    Past Medical History  Diagnosis Date  . COPD (chronic obstructive pulmonary disease)   . Diabetes mellitus without complication   . Hypertension   . CKD (chronic kidney disease)   . Sleep apnea   . Rhabdomyolysis   . Hyperlipidemia   . Morbid obesity   . Depression   . CHF (congestive heart failure)    Past Surgical History  Procedure Laterality Date  . Toe amputation Right   . Foot surgery Right   . Tonsillectomy    . Abdominal hernia repair  1989  . Cardiac catheterization      [facility] no stents   Family History  Problem Relation Age of Onset  . Cancer Mother     Pancreatic  . Diabetes Sister   . Kidney disease Sister   . Diabetes Maternal Grandmother   . Heart  disease Sister     Heart Failure   Social History  Substance Use Topics  . Smoking status: Former Smoker -- 2.00 packs/day for 35 years    Types: Cigarettes    Quit date: 11/08/2010  . Smokeless tobacco: Never Used  . Alcohol Use: No    Allergies  Allergen Reactions  . Statins Swelling and Other (See Comments)    "Took strength out of legs"; rhabdomyolisis  . Ivp Dye [Iodinated Diagnostic Agents]   . Penicillins Rash    Prior to Admission medications   Medication Sig Start Date End Date Taking? Authorizing Provider  acetaminophen-codeine (TYLENOL #3) 300-30 MG per tablet Take 1 tablet by mouth 2 (two) times daily. 01/02/15  Yes Historical Provider, MD  amitriptyline (ELAVIL) 50 MG tablet Take 1 tablet (50 mg total) by mouth at bedtime. 03/09/15  Yes Katharina Caper, MD  aspirin 81 MG tablet Take 81 mg by mouth daily.   Yes Historical Provider, MD  baclofen (LIORESAL) 20 MG tablet Take 1 tablet by mouth at bedtime as needed (as needed for pain).  07/15/14  Yes Historical Provider, MD  carvedilol (COREG) 6.25 MG tablet Take 1 tablet by mouth 2 (two) times daily. 04/06/14  Yes Historical Provider, MD  gemfibrozil (LOPID) 600 MG tablet Take 1 tablet by mouth 2 (two) times daily. 09/08/14  Yes Historical Provider, MD  glipiZIDE (GLUCOTROL) 10 MG tablet Take 1 tablet by mouth daily before breakfast.  01/27/15  Yes Historical Provider, MD  insulin aspart (NOVOLOG) 100 UNIT/ML injection Inject into  the skin 3 (three) times daily before meals. As needed per sliding scale   Yes Historical Provider, MD  Insulin Detemir (LEVEMIR) 100 UNIT/ML Pen Inject 45 Units into the skin daily at 10 pm.    Yes Historical Provider, MD  lactulose (CHRONULAC) 10 GM/15ML solution Take 15 mLs by mouth 2 (two) times daily.  12/04/14  Yes Historical Provider, MD  metoCLOPramide (REGLAN) 5 MG/5ML solution Take 5 mg by mouth 3 (three) times daily as needed for nausea (indigestion and/or heartburn).    Yes Historical Provider, MD   metolazone (ZAROXOLYN) 5 MG tablet Take 1 tablet by mouth daily as needed.  12/09/14  Yes Historical Provider, MD  potassium chloride SA (K-DUR,KLOR-CON) 20 MEQ tablet Take 20 mEq by mouth 2 (two) times daily.   Yes Historical Provider, MD  torsemide (DEMADEX) 100 MG tablet Take 100 mg by mouth 2 (two) times daily.   Yes Historical Provider, MD  venlafaxine XR (EFFEXOR-XR) 75 MG 24 hr capsule Take 1 capsule by mouth 2 (two) times daily.  03/07/14  Yes Historical Provider, MD  docusate sodium (COLACE) 100 MG capsule Take 100 mg by mouth 2 (two) times daily as needed for mild constipation.    Historical Provider, MD  Liraglutide 18 MG/3ML SOPN Inject 1.6 mg into the skin daily. 03/14/14 04/30/15  Historical Provider, MD      Review of Systems  Constitutional: Positive for appetite change (decreased taste) and fatigue.  HENT: Positive for congestion and postnasal drip. Negative for sore throat.   Eyes: Negative.   Respiratory: Negative for chest tightness and shortness of breath.   Cardiovascular: Positive for leg swelling. Negative for chest pain and palpitations.  Gastrointestinal: Positive for abdominal distention. Negative for nausea and abdominal pain.  Endocrine: Positive for polydipsia (at times) and polyuria.  Genitourinary: Negative.   Musculoskeletal: Positive for back pain, arthralgias (legs hurt) and muscle weakness (both legs).  Skin: Negative.   Allergic/Immunologic: Negative.   Neurological: Positive for dizziness (no falls), weakness, light-headedness and numbness (and tingling in hands and feet).  Hematological: Negative for adenopathy. Bruises/bleeds easily.  Psychiatric/Behavioral: Positive for dysphoric mood. Negative for suicidal ideas, confusion and sleep disturbance (sleeping on 2 pillows). The patient is nervous/anxious.        Objective:   Physical Exam  Constitutional: He is oriented to person, place, and time. He appears well-developed and well-nourished.  HENT:   Head: Normocephalic and atraumatic.  Eyes: Conjunctivae are normal. Pupils are equal, round, and reactive to light.  Neck: Normal range of motion. Neck supple.  Cardiovascular: Normal rate and regular rhythm.   Pulmonary/Chest: Effort normal. He has no wheezes. He has no rales.  Abdominal: Soft. He exhibits distension. There is no tenderness.  Musculoskeletal: He exhibits edema (1+ in bilateral lower legs). He exhibits no tenderness.  Neurological: He is alert and oriented to person, place, and time.  Skin: Skin is warm and dry.  Psychiatric: He has a normal mood and affect. His behavior is normal. Thought content normal.  Nursing note and vitals reviewed.   BP 131/54 mmHg  Pulse 70  Resp 20  Ht 5\' 10"  (1.778 m)  Wt 290 lb (131.543 kg)  BMI 41.61 kg/m2  SpO2 97%       Assessment & Plan:  1: Chronic heart failure with preserved ejection fraction- Patient presents with some fatigue upon exertion but denies any shortness of breath even with walking into the office today. When he does get tired, he will stop what he's  doing to rest until his energy level improves. He continues to weigh himself and says that his home weight ranges from 265-267. By our scale, he's gained 11 pounds since being here in May 2016. He does have metolazone that he takes as needed for weight and edema. Reminded to call for an overnight weight gain of >2 pounds or a weekly weight gain of >5 pounds. He is not adding any salt to his food and tries to eat low salt items but admits that it's difficult at times due to finances. Does have some edema in his legs today but he says that they improve after he elevates his legs.  2: Sleep apnea- Patient has CPAP and oxygen at home but doesn't wear either one. He says that he doesn't need the CPAP as he's "not having the problems sleeping like before". Discussed how untreated sleep apnea can affect the heart and his other health problems. Strongly advised and encouraged patient to  wear his CPAP and oxygen at night.  3: Diabetes- Patient is being followed closely by endocrinology and sees Dr. Renae Fickle on 07/22/15. Fasting glucose this morning was 167 but he says that it's been better than that lately. 4: Depression- Patient scored a 14 on the depression scale and mentioned that he's had thoughts of being better off dead at times. When asked about it, patient denies any suicidal thoughts/plans and endorses that he has a grandchild that he has to stay around for to help raise. He does mention depression regarding his finances, his health and the burden he feels like he puts on his wife since he can't help around the house like he used to while she continues to work. Patient has had his venlafaxine increased by his neurologist but patient doesn't notice any change in his symptoms. Encouraged he and his wife to closely keep an eye on his depression and should it worsen or he develop suicidal thoughts, to call his PCP immediately and both of them say that they will.  Return in 3 months or sooner for any questions/problems before then.

## 2015-07-09 NOTE — Patient Instructions (Signed)
Continue weighing daily and call for an overnight weight gain of > 2 pounds or a weekly weight gain of >5 pounds. 

## 2015-07-18 ENCOUNTER — Telehealth: Payer: Self-pay | Admitting: Family Medicine

## 2015-07-18 NOTE — Telephone Encounter (Signed)
Pt needs referral to Dr Renae Fickle, she is at [facility] in [place]. Also a referral to Dr Lynnda Shields at [facility]. Pt has both appointments scheduled for 07/22/15.

## 2015-07-22 NOTE — Telephone Encounter (Signed)
Spoke with [facility], Referral is still valid and his one for Dr. Renae Fickle is still valid in the [employer] portal.

## 2015-08-08 ENCOUNTER — Other Ambulatory Visit: Payer: Self-pay | Admitting: Family Medicine

## 2015-08-08 NOTE — Telephone Encounter (Signed)
Patient requesting refill. 

## 2015-08-13 ENCOUNTER — Telehealth: Payer: Self-pay | Admitting: Family Medicine

## 2015-08-13 NOTE — Telephone Encounter (Signed)
Please send Torsemide and Baclofen to [facility] with both being a 3 month supply.

## 2015-08-14 ENCOUNTER — Other Ambulatory Visit: Payer: Self-pay | Admitting: Family Medicine

## 2015-08-14 MED ORDER — TORSEMIDE 100 MG PO TABS: 100.0000 mg | ORAL_TABLET | Freq: Two times a day (BID) | ORAL | Status: AC

## 2015-08-14 NOTE — Telephone Encounter (Signed)
Sent Torsemide today for 90 days and one refill Had already done Baclofen earlier this week

## 2015-08-18 NOTE — Telephone Encounter (Signed)
Patient notified

## 2015-09-04 NOTE — Patient Outreach (Signed)
[facility](THN) Care Management  09/04/2015  Carl HoehnMichael E Perry August 21, 1956 [id number]   Referral from [facility] tier 4 list, assigned to Elmer Pickerarlene Cooper, RN for patient outreach.  Carl Perry L. Carl Perry, AAS [facility] Care Management Assistant

## 2015-09-08 ENCOUNTER — Other Ambulatory Visit: Payer: Self-pay

## 2015-09-08 NOTE — Patient Outreach (Signed)
[facility](THN) Care Management  09/08/2015  Haynes HoehnMichael E Bordelon 1956-05-03 [id number]  Telephone Screen   Referral Date: 09/04/15 Referral Source: [facility] Tier 4 [facility] Referral Reason: DM,COPD & CHF with 1 ED Visit and 1 Hospital Admit Primary MD: Dr. Carlynn PurlSowles  Outreach attempt # 1 to patient. Patient not reached. RN CM left HIPAA compliant voicemail message with RN CM contact info.  Plan:  RN CM will attempt another outreach call to patient within a week.  Antionette Fairyoshanda Jacquel Mccamish, RN,BSN,CCM [facility] Care Management Telephonic Care Mgmt. Coordinator Direct Phone: [phone] Fax: [phone]

## 2015-09-10 ENCOUNTER — Other Ambulatory Visit: Payer: Self-pay

## 2015-09-10 DIAGNOSIS — I5032 Chronic diastolic (congestive) heart failure: Secondary | ICD-10-CM

## 2015-09-10 NOTE — Patient Outreach (Signed)
[facility](THN) Care Management  09/10/2015  Carl Perry 01-May-1956 [id number]   Telephone Screen   Referral Date: 09/04/15 Referral Source: [facility] Tier 4 List Referral Reason: DM,COPD & CHF with 1 ED Visit and 1 Hospital Admit Primary MD: Dr. Carlynn PurlSowles  Incoming call received from patient returning RN CM voicemail from earlier this week.  Social: Patient resides in his home with his spouse. He has other supportive family that lives nearby as well. He is independent with his care at present. Patient still driving and takes himself to MD appts. He reports that he did "stumble" and sustained a fall about a month ago with no injuries. DME in the home includes: cane,walker, wheelchair, oxygen and CPAP machine. He reports that he is only using oxygen prn and has not had to use it much lately. He reports occasional pain to elbows,shoulders and knees related to history of tendonitis.   Conditions: Patient suffers from COPD, sleep apnea and CHF. He states that he quit smoking about five years ago. Patient is monitoring blood sugars in the home. Blood sugar this am was 67. Patient states that he gets A1C checks q3-246months and last one was less than 7.   Medications:Patient is taking more than 10 meds per his report. However, he denies any difficulty affording meds as he uses [employer] mail order.   Appointments:Last PCP visit was a few months ago. He does have an appt on 09/17/15 with PCP. He plans to get flu vaccine during this appt. He is also followed by several other specialists including heart, kidney and DM MD. He states that he needs to see a dentist but is unable to afford one at this time.  Consents: [facility]. He also [facility] to speak with his spouse regarding his care.  Plan: RN CM will notify [facility] administrative assistant that patient agreed to services and case opened. RN CM will send [facility] per  protocol as patient taking more than 10 meds. RN CM will send [facility] SW referral for financial assessment and dental resources. RM CM will send successful outreach letter to patient with introductory [facility] welcome package. RM CM instructed in completion of written consent forms. RN CM advised patient to notify MD of any changes in condition. RN CM provided contacted name, phone number and 24hr nurse access line info.  Antionette Fairyoshanda Ethlyn Alto, RN,BSN,CCM [facility] Care Management Telephonic Care Management Coordinator Direct Phone: [phone] Toll Free: [phone] Fax: [phone]

## 2015-09-10 NOTE — Patient Outreach (Signed)
[facility](THN) Care Management  09/10/2015  Carl HoehnMichael E Perry 1956/06/19 [id number]   Request from Antionette Fairyoshanda Florance, RN to assign Pharmacy and SW, assigned Duanne MoronElisabeth Dhalla, PharmD and [place], [place].  Thanks, Corrie MckusickLisa O. Sharlee BlewMoore, AABA [facility] Care Management [facility] CM Assistant Phone: [phone] Fax: [phone]

## 2015-09-17 ENCOUNTER — Ambulatory Visit: Payer: Commercial Managed Care - HMO | Admitting: Family Medicine

## 2015-09-18 ENCOUNTER — Other Ambulatory Visit: Payer: Self-pay | Admitting: Pharmacist

## 2015-09-18 NOTE — Patient Outreach (Addendum)
[facility](THN) Care Management  [facility]   09/18/2015  Carl HoehnMichael E Fano 01/13/1956 [id number]  Subjective: Carl Perry is a 59 y.o. male who [facility] for a medication review.   Objective:   Current Medications: Current Outpatient Prescriptions  Medication Sig Dispense Refill  . acetaminophen-codeine (TYLENOL #3) 300-30 MG per tablet Take 1 tablet by mouth 2 (two) times daily.    Marland Kitchen. amitriptyline (ELAVIL) 50 MG tablet Take 1 tablet (50 mg total) by mouth at bedtime. 30 tablet 0  . aspirin 81 MG tablet Take 81 mg by mouth daily.    . baclofen (LIORESAL) 20 MG tablet TAKE 1 TABLET AT BEDTIME AS NEEDED  FOR  SPASMS 90 tablet 1  . carvedilol (COREG) 6.25 MG tablet Take 1 tablet by mouth 2 (two) times daily.    Marland Kitchen. docusate sodium (COLACE) 100 MG capsule Take 100 mg by mouth 2 (two) times daily as needed for mild constipation.    Marland Kitchen. gemfibrozil (LOPID) 600 MG tablet Take 1 tablet by mouth 2 (two) times daily.    Marland Kitchen. glipiZIDE (GLUCOTROL) 10 MG tablet Take 1 tablet by mouth daily before breakfast.     . insulin aspart (NOVOLOG) 100 UNIT/ML injection Inject into the skin 3 (three) times daily before meals. As needed per sliding scale    . Insulin Detemir (LEVEMIR) 100 UNIT/ML Pen Inject 45 Units into the skin daily at 10 pm.     . lactulose (CHRONULAC) 10 GM/15ML solution Take 15 mLs by mouth 2 (two) times daily.     . Liraglutide 18 MG/3ML SOPN Inject 1.6 mg into the skin daily.    . metoCLOPramide (REGLAN) 5 MG/5ML solution Take 5 mg by mouth 3 (three) times daily as needed for nausea (indigestion and/or heartburn).     . metolazone (ZAROXOLYN) 5 MG tablet Take 1 tablet by mouth daily as needed.     . potassium chloride SA (K-DUR,KLOR-CON) 20 MEQ tablet Take 20 mEq by mouth 2 (two) times daily.    Marland Kitchen. torsemide (DEMADEX) 100 MG tablet Take 1 tablet (100 mg total) by mouth 2 (two) times daily. 180 tablet 1  . venlafaxine XR (EFFEXOR-XR) 75 MG 24 hr  capsule Take 1 capsule by mouth 2 (two) times daily.      No current facility-administered medications for this visit.    Functional Status: In your present state of health, do you have any difficulty performing the following activities: 07/09/2015 06/17/2015  Hearing? N N  Vision? Y Y  Difficulty concentrating or making decisions? N N  Walking or climbing stairs? Y Y  Dressing or bathing? Y N  Doing errands, shopping? Y Y  Preparing Food and eating ? - -  Using the Toilet? - -  In the past six months, have you accidently leaked urine? - -  Do you have problems with loss of bowel control? - -  Managing your Medications? - -  Housekeeping or managing your Housekeeping? - -    Fall/Depression Screening: PHQ 2/9 Scores 07/09/2015 06/17/2015 04/08/2015 02/12/2015  PHQ - 2 Score 3 0 4 -  PHQ- 9 Score 14 - 7 -  Exception Documentation - - - (No Data)    Assessment:  Drugs sorted by system:  Neurologic/Psychologic: amitriptyline, venlafaxine  Cardiovascular: aspirin 81, carvedilol, gemfibrozil, metolazone PRN, torsemide  Pulmonary/Allergy:  Gastrointestinal: docusate, lactulose, metoclopramide  Endocrine: glipizide, Novolog, Levemir, Victoza  Renal: none   Topical: none  Pain: acetaminophen-codeine, baclofen  Vitamins/Minerals/Supplements: potassium chloride  Infectious Diseases: none  Miscellaneous: none   Duplications in therapy: Victoza and Novolog, however, they can be used together if blood glucose is being closely monitored (last A1c was 6.6 per care everywhere). Patient only uses Novolog sliding scale and is closely followed by Endocrinology at [facility].   Gaps in therapy: intolerant to statins, on aspirin, on beta blocker, not on ACEI/ARB  Medications to avoid in the elderly: patient <37 yo Drug interactions: gemfibrozil is an inhibitor and may decrease metabolism of torsemide and glipizide. Victoza and Novolog both provide post-prandial blood glucose coverage and have  not been studied together. Metoclopramide should be used with caution with amitriptyline and venlafaxine. However, if patient is tolerating all meds, no changes are needed.   Other issues noted: gemfibrozil should be used with caution in poor renal function.   Plan: 1. Medication reconciliation: no major issues noted and no recommendations for any changes at this time. Patient does not have CAD or LVSD so ACEI/ARB not necessary at this time. Will send a fax to Dr. Carlynn Purl to closely monitor renal function with gemfibrozil and to consider a dose reduction or discontinuation of the agent if renal function is compromised.    Juanita Craver, PharmD, BCPS Clinical Pharmacist [facility] [phone]

## 2015-09-19 ENCOUNTER — Other Ambulatory Visit: Payer: Self-pay | Admitting: *Deleted

## 2015-09-19 NOTE — Patient Outreach (Signed)
[facility]N) Care Management  09/19/2015  Carl Perry 20-Jul-1956 [id number]  Phone call to patient to provide resources related to dental care and Medicaid eligibility.  HIPPA compliant voicemail message left for a return call.    Adriana ReamsChrystal Jesslyn Viglione, LCSW [facility] Care Management [phone]

## 2015-09-22 ENCOUNTER — Other Ambulatory Visit: Payer: Self-pay | Admitting: *Deleted

## 2015-09-22 ENCOUNTER — Ambulatory Visit: Payer: Commercial Managed Care - HMO

## 2015-09-22 ENCOUNTER — Encounter: Payer: Commercial Managed Care - HMO | Admitting: *Deleted

## 2015-09-22 NOTE — Patient Outreach (Signed)
[facility]N) Care Management  09/22/2015  Carl HoehnMichael E Perry 10-30-1956 [id number]   Phone call to patient to assess for social work needs.  Per patient, he is currently on disability and cannot afford his co-pays and other finances to meet his medical needs.  Per patient, he needs new glasses as well as dental work.  Plan: This Child psychotherapistsocial worker will give him the contact number for the [employer] for the glasses  as well as contact information for consumer credit counseling in his area.    Home visit scheduled for 09/30/15 12:30pm  Carl Barwick Lonn GeorgiaLand, LCSW [facility] Care Management [phone]

## 2015-09-30 ENCOUNTER — Ambulatory Visit: Payer: Commercial Managed Care - HMO | Admitting: *Deleted

## 2015-10-08 ENCOUNTER — Other Ambulatory Visit: Payer: Self-pay | Admitting: *Deleted

## 2015-10-08 ENCOUNTER — Ambulatory Visit: Payer: Commercial Managed Care - HMO | Admitting: Family

## 2015-10-08 ENCOUNTER — Ambulatory Visit: Payer: Commercial Managed Care - HMO | Admitting: *Deleted

## 2015-10-08 NOTE — Patient Outreach (Addendum)
[facility](THN) Care Management  10/08/2015  Haynes HoehnMichael E Tippy [birth year] [id number]   Phone call to patient to remind him of home visit today.  Per patientt, he was not feeling well and would like to re-schedule home visit.  Home visit rescheduled for 10/14/15 at 11:00am.     Adriana ReamsChrystal Jd Mccaster, LCSW [facility] Care Management [phone]

## 2015-10-10 ENCOUNTER — Other Ambulatory Visit: Payer: Self-pay | Admitting: *Deleted

## 2015-10-10 ENCOUNTER — Encounter: Payer: Self-pay | Admitting: *Deleted

## 2015-10-10 ENCOUNTER — Telehealth: Payer: Self-pay | Admitting: Family Medicine

## 2015-10-10 DIAGNOSIS — I5032 Chronic diastolic (congestive) heart failure: Secondary | ICD-10-CM

## 2015-10-10 NOTE — Telephone Encounter (Signed)
[facility] wanted to let you know that she spoke with the patient today and completed PHQ2 and 9 Depression Scale and he scored total 18 (moderate). He is taking the medication and it is slightly working okay. Patient does have upcoming appointment next week and she advised him to speak with you concerning this. If you have any questions Agustin CreeDarlene can be reached at [phone].

## 2015-10-10 NOTE — Progress Notes (Signed)
This encounter was created in error - please disregard.

## 2015-10-10 NOTE — Patient Outreach (Signed)
[facility] ([facility]) Care Management  10/10/2015  Carl Perry [birth year] [id number]   Subjective: Telephone call to patient.   HIPAA verified.  Patient states RNCM can speak with wife Carl Perry and daughter Carl Perry as needed regarding patient.   Patient states he is doing better today, has not been feeling well for the last 2 weeks.   States he thought he was going to have to go to the emergency room but did not want to go.   RNCM advised patient to notify MD of any condition changes.  Patient voiced understanding and is in agreement.    Patient states he has been spitting up some brown mucus and is planning to discuss with Dr. Carlynn PurlSowles during his 10/14/15 office visit.   Patient states he will notify MD's office if the mucus gets worse or he feels worse overall.   Patient states he schedules all of his medical appointments for Tuesdays or Wednesdays, so his wife can transport and accompany him to the appointments.   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   Patient he will discuss the fall with MD during next office visit and verify if any other follow up needed.   RNCM discussed depression screening results with patient.   Patient in agreement with [facility] referral and RNCM contacting primary MD regarding the depression screening results.  Patient states he feels down because he can not do all the things he used too.  Patient states one of his coping strategy is to take one day at a time and do whatever he feels like doing or not doing that day.    RNCM called Dr. Carlynn PurlSowles office and [facility] regarding the PHQ 2 & 9 depression screening results.  Cassandra states Dr. Carlynn PurlSowles has left for the day and she will make sure MD receives the update.   RNCM provided Cassandra with RNCM's contact information if follow up call needed.    RNCM advised Cassandra that patient is currently taking  depression medications as directed and will discuss how he is currently feeling during his upcoming appointment with Dr. Carlynn PurlSowles on 10/14/15.   Patient states he does not read, only attended school through part of the 10th grade and was only passed along during school due to his age.   Patient states he does not wish to pursue adult reading class and his wife helps him as needed. RNCM received verbal authorization from patient that he would like to continue to receive [facility] services.   Patient states he prefers human calls versus automated calls and will not answer 800 numbered calls that display on his caller ID.   Objective:  Per [facility] Welcome packet sent.     Assessment:  Patient's initial assessment in progress.   Patient [employer] high risk referral.   Patient scored 18 on the PHQ 2 & 9 depression screening.  Patient currently taking medications for depression and feels it is working for him.    Patient enrolled in [facility] Congestive [facility] Failure education, care coordination for depression screening follow up, and care coordination for scale purchase for congestive heart failure status monitoring.   Plan: RNCM will contact patient within 3 business days to continue to follow up on  telephone outreach for  initial assessment.  RNCM will follow up with patient within 3 business days regarding new scale purchase. RNCM will follow up  with patient regarding MD's assessment of current depression medications within 3 business days.  RNCM will refer patient to [facility] Social Worker for follow up interventions related to depression screening results within 1 business day.   RNCM will send request for patient to be mailed a 2017 [facility] calendar within 3 weeks.      Boy Delamater H. Gardiner Barefoot, BSN, CCM [facility] Care Management [facility] Telephonic CM Phone: [phone] Fax: [phone]

## 2015-10-13 ENCOUNTER — Encounter: Payer: Self-pay | Admitting: *Deleted

## 2015-10-14 ENCOUNTER — Ambulatory Visit (INDEPENDENT_AMBULATORY_CARE_PROVIDER_SITE_OTHER): Payer: Commercial Managed Care - HMO | Admitting: Family Medicine

## 2015-10-14 ENCOUNTER — Encounter: Payer: Self-pay | Admitting: Family Medicine

## 2015-10-14 ENCOUNTER — Ambulatory Visit
Admission: RE | Admit: 2015-10-14 | Discharge: 2015-10-14 | Disposition: A | Discharge status: Home / Self Care | Payer: Commercial Managed Care - HMO | Source: Ambulatory Visit | Attending: Family Medicine | Admitting: Family Medicine

## 2015-10-14 ENCOUNTER — Other Ambulatory Visit: Payer: Self-pay | Admitting: *Deleted

## 2015-10-14 VITALS — BP 124/72 | HR 66 | Temp 97.4°F | Resp 16 | Ht 70.0 in | Wt 266.0 lb

## 2015-10-14 DIAGNOSIS — K7290 Hepatic failure, unspecified without coma: Secondary | ICD-10-CM | POA: Insufficient documentation

## 2015-10-14 DIAGNOSIS — K7210 Chronic hepatic failure without coma: Secondary | ICD-10-CM

## 2015-10-14 DIAGNOSIS — I10 Essential (primary) hypertension: Secondary | ICD-10-CM

## 2015-10-14 DIAGNOSIS — R0989 Other specified symptoms and signs involving the circulatory and respiratory systems: Secondary | ICD-10-CM

## 2015-10-14 DIAGNOSIS — E1122 Type 2 diabetes mellitus with diabetic chronic kidney disease: Secondary | ICD-10-CM | POA: Diagnosis not present

## 2015-10-14 DIAGNOSIS — E785 Hyperlipidemia, unspecified: Secondary | ICD-10-CM | POA: Diagnosis not present

## 2015-10-14 DIAGNOSIS — J189 Pneumonia, unspecified organism: Secondary | ICD-10-CM | POA: Diagnosis not present

## 2015-10-14 DIAGNOSIS — N184 Chronic kidney disease, stage 4 (severe): Secondary | ICD-10-CM

## 2015-10-14 DIAGNOSIS — N189 Chronic kidney disease, unspecified: Secondary | ICD-10-CM | POA: Diagnosis not present

## 2015-10-14 DIAGNOSIS — Z23 Encounter for immunization: Secondary | ICD-10-CM | POA: Diagnosis not present

## 2015-10-14 DIAGNOSIS — Z89412 Acquired absence of left great toe: Secondary | ICD-10-CM | POA: Diagnosis not present

## 2015-10-14 DIAGNOSIS — I739 Peripheral vascular disease, unspecified: Secondary | ICD-10-CM | POA: Diagnosis not present

## 2015-10-14 DIAGNOSIS — N2889 Other specified disorders of kidney and ureter: Secondary | ICD-10-CM

## 2015-10-14 DIAGNOSIS — R0902 Hypoxemia: Secondary | ICD-10-CM

## 2015-10-14 DIAGNOSIS — E1169 Type 2 diabetes mellitus with other specified complication: Secondary | ICD-10-CM

## 2015-10-14 DIAGNOSIS — E119 Type 2 diabetes mellitus without complications: Secondary | ICD-10-CM

## 2015-10-14 DIAGNOSIS — N2581 Secondary hyperparathyroidism of renal origin: Secondary | ICD-10-CM | POA: Diagnosis not present

## 2015-10-14 DIAGNOSIS — Z794 Long term (current) use of insulin: Secondary | ICD-10-CM

## 2015-10-14 DIAGNOSIS — J449 Chronic obstructive pulmonary disease, unspecified: Secondary | ICD-10-CM | POA: Diagnosis not present

## 2015-10-14 DIAGNOSIS — D638 Anemia in other chronic diseases classified elsewhere: Secondary | ICD-10-CM

## 2015-10-14 DIAGNOSIS — I5032 Chronic diastolic (congestive) heart failure: Secondary | ICD-10-CM | POA: Diagnosis not present

## 2015-10-14 DIAGNOSIS — E1159 Type 2 diabetes mellitus with other circulatory complications: Secondary | ICD-10-CM

## 2015-10-14 DIAGNOSIS — E669 Obesity, unspecified: Secondary | ICD-10-CM

## 2015-10-14 DIAGNOSIS — E1140 Type 2 diabetes mellitus with diabetic neuropathy, unspecified: Secondary | ICD-10-CM

## 2015-10-14 DIAGNOSIS — F339 Major depressive disorder, recurrent, unspecified: Secondary | ICD-10-CM

## 2015-10-14 MED ORDER — VENLAFAXINE HCL ER 75 MG PO CP24: 225.0000 mg | ORAL_CAPSULE | Freq: Every day | ORAL | Status: AC

## 2015-10-14 MED ORDER — ACETAMINOPHEN-CODEINE #3 300-30 MG PO TABS: 1.0000 | ORAL_TABLET | Freq: Three times a day (TID) | ORAL | Status: AC | PRN

## 2015-10-14 MED ORDER — SULFAMETHOXAZOLE-TRIMETHOPRIM 800-160 MG PO TABS: 1.0000 | ORAL_TABLET | Freq: Two times a day (BID) | ORAL | Status: DC

## 2015-10-14 NOTE — Patient Outreach (Addendum)
[facility]) Care Management  [facility] Social Work  10/14/2015  Carl Perry 09-Mar-1956 [id number]  Subjective:  Patient is a 59 year old male.  Objective:   Current Medications:  Current Outpatient Prescriptions  Medication Sig Dispense Refill  . acetaminophen-codeine (TYLENOL #3) 300-30 MG per tablet Take 1 tablet by mouth 2 (two) times daily.    Marland Kitchen amitriptyline (ELAVIL) 50 MG tablet Take 1 tablet (50 mg total) by mouth at bedtime. 30 tablet 0  . aspirin 81 MG tablet Take 81 mg by mouth daily.    . baclofen (LIORESAL) 20 MG tablet TAKE 1 TABLET AT BEDTIME AS NEEDED  FOR  SPASMS 90 tablet 1  . carvedilol (COREG) 6.25 MG tablet Take 1 tablet by mouth 2 (two) times daily.    Marland Kitchen docusate sodium (COLACE) 100 MG capsule Take 100 mg by mouth 2 (two) times daily as needed for mild constipation.    Marland Kitchen gemfibrozil (LOPID) 600 MG tablet Take 1 tablet by mouth 2 (two) times daily.    Marland Kitchen glipiZIDE (GLUCOTROL) 10 MG tablet Take 1 tablet by mouth daily before breakfast.     . insulin aspart (NOVOLOG) 100 UNIT/ML injection Inject into the skin 3 (three) times daily before meals. As needed per sliding scale    . Insulin Detemir (LEVEMIR) 100 UNIT/ML Pen Inject 45 Units into the skin daily at 10 pm.     . lactulose (CHRONULAC) 10 GM/15ML solution Take 15 mLs by mouth 2 (two) times daily.     . Liraglutide 18 MG/3ML SOPN Inject 1.6 mg into the skin daily.    . metoCLOPramide (REGLAN) 5 MG/5ML solution Take 5 mg by mouth 3 (three) times daily as needed for nausea (indigestion and/or heartburn).     . metolazone (ZAROXOLYN) 5 MG tablet Take 1 tablet by mouth daily as needed.     . potassium chloride SA (K-DUR,KLOR-CON) 20 MEQ tablet Take 20 mEq by mouth 2 (two) times daily.    Marland Kitchen torsemide (DEMADEX) 100 MG tablet Take 1 tablet (100 mg total) by mouth 2 (two) times daily. 180 tablet 1  . venlafaxine XR (EFFEXOR-XR) 75 MG 24 hr capsule Take 1 capsule by mouth 2 (two) times daily.      No  current facility-administered medications for this visit.    Functional Status:  In your present state of health, do you have any difficulty performing the following activities: 07/09/2015 06/17/2015  Hearing? N N  Vision? Y Y  Difficulty concentrating or making decisions? N N  Walking or climbing stairs? Y Y  Dressing or bathing? Y N  Doing errands, shopping? Y Y  Preparing Food and eating ? - -  Using the Toilet? - -  In the past six months, have you accidently leaked urine? - -  Do you have problems with loss of bowel control? - -  Managing your Medications? - -  Housekeeping or managing your Housekeeping? - -    Fall/Depression Screening:  PHQ 2/9 Scores 10/10/2015 07/09/2015 06/17/2015 04/08/2015 02/12/2015  PHQ - 2 Score 4 3 0 4 -  PHQ- 9 Score 18 14 - 7 -  Exception Documentation - - - - (No Data)    Assessment:   Home visit to patient's home to provide community resources to address patient's financial concerns as well as depressed mood.  Per patient, his depressed mood is mainly related to his financial debt and not being able to physically do the things that he once did.  Patient  describes very little socialization as his wife works during and his days are mainly filled up with medical appointments.   Patient discussed inreased financial problems due to medical issues and has already applied for a financial hardhship through the hospital and is on a payment plan.  He has also applied for bankruptcy in the past and has utilized food banks. Patient discussed having a long history of depression, denied current thoughts of hurting himself or others however declined referrals for mental health counseling or to [facility] telephonic program stating that he has tried therapy in the past and "nothing has worked".  Per patient, his granddaughter is keeping him going.  "I live for her".   Emotional support provided, self care emphasized. Dental options discussed.  Patient  has limited coverage for dental on his medical plan.  [facility].  Family support explored, patient's spouse reports that their son in law lives next door and is very helpful and well as her daughter helpful as well.    Larey SeatFell this morning out of the bed not working, sitting all day, nothing to do, increase socialization has silver sneakers, senior center, granddaughter keeps her going  declined [employer] behavioral   Plan:   Following community referrals provided to patient during visit: Nurse, adultConsumer Credit Counseling [phone]- Food Pantries-[employer]-  also sent request for care manager assistant to send patient additional food pantry resources by mail Mental Health Referral for outpatient therapy as well as telephonic through [facility] [facility] program offered  Encouraged increasing socialization-discussed increasing activity through use of the senior center as well as silver sneakers benefit. Emmy on depression assigned-patient's spouse will assist patient with this due to literacy issues.  Follow up on 2 weeks to confirm that food pantry resources were received.       [facility] CM Care Plan Problem One        Most Recent Value   Care Plan Problem One  patient needs food pantry  resources   Role Documenting the Problem One  Clinical Social Worker   Care Plan for Problem One  Active   [facility] CM Short Term Goal #1 (0-30 days)  patient will verbalize receipt of food  pantry  resources mailed to his home within 30 days   [facility] CM Short Term Goal #1 Start Date  10/14/15   Interventions for Short Term Goal #1  LCSW sent a request for care managment assistant to mail patient food pantyr resource list

## 2015-10-14 NOTE — Patient Outreach (Signed)
[facility]) Care Management  10/14/2015  Carl Perry 1956/04/27 [id number]   Request [facility] from [facility], LCSW to mail patient information on local food banks. Information mailed today, 10/14/15.  Sherle PoeNicole Pattrick Bady [facility] Care Management Assistant

## 2015-10-14 NOTE — Progress Notes (Signed)
Name: Carl Perry   MRN: [id number]    DOB: 1956-10-02   Date:10/14/2015       Progress Note  Subjective  Chief Complaint  Chief Complaint  Patient presents with  . Medication Refill    3 month F/U  . Hypertension    Dizzy and has been checking bp at home 150-130/70's  . Depression    Unchanged  . URI    Onset 2 weeks, cough-brown mucus, congestion, fatigue, wheezing, sob and starting to improve. Has tried NyQuil with some relief.  . Fatigue    Has been very tired and would like to discuss taking sometime to help his energy level.   . Edema    Was having edema and Dr. Wynelle LinkKolluru told patient to double up on Lasix and has helped symptoms.    HPI  Chronic Diastolic Heart Failure: he states he was swollen last week, but took more diuretics and weight is back to baseline now. He has SOB with mild to moderate activity. He has been compliant with medication. He has been feeling dizzy when he stands up latelyl. He has mild orthopnea, down to 2 pillows at night.   HTN: bp is towards low end of normal, and he has been getting dizzy. Discussed importance of getting up slowly to avoid falls.   CKI: stage IV - stable, sees nephrologist - Dr. Ronn MelenaKolloru, we will check labs and fax it to him.  Recent Fall: he stood up three days ago and states his knees buckled. He did not feel dizzy at the time, and states he did not get hurt and did not pass out.   COPD moderate: he has quit smoking, he has been coughing daily since a recent URI, has SOB with moderate activity, sputum is productive and yellow to brown in color.  He is not  taking any medications at this time for COPD secondary to cost.  except for Prn inhalers. He has been feeling more tired and sleep than usual lately  DMII: with neuropathy and CKI: he is seeing Dr. Renae FicklePaul, glucose at home has been at goal, he has some  hypoglycemic episodes down to 50, no polyphagia, but has episodes of polydipsia, Taking medications as recommended. Sees Dr.  Orland Jarredroxler ( s/p right big toe amputation), he has neuropathy and has constant paresthesia of both feet and times of his hands. He was on Gabapentin but he stopped on his own because it was causing severe dizziness. He is currently on Amitriptyline  and tylenol number 3 with some control of symptoms  Major Depression: he is taking medication as prescribed. He has anhedonia, feels sad and has some crying spells at times. He is usually sad because of his health and inability to function. He states he has suicidal thoughts , but no planning. States symptoms are seldom and he would not do it because he has a grand-child that keeps him going.   Hepatic Failure: he was admitted in 2015 with hepatic failure, he was discharged with lactulose and is doing well with medication, taking it twice daily He denies confusion, tremors or jaundice  Morbid Obesity, DM, HTN: he is trying to eat healthier. Sees Endo      Patient Active Problem List   Diagnosis Date Noted  . Oxygen deficit 10/14/2015  . Hepatic failure (HCC) 10/14/2015  . Hyperparathyroidism, secondary renal (HCC) 06/20/2015  . Proteinuria 06/20/2015  . Anemia of chronic disease 06/20/2015  . History of rhabdomyolysis 06/17/2015  . PVD (peripheral vascular disease) (  HCC) 06/17/2015  . Ventral hernia 06/17/2015  . Hyperlipidemia 06/17/2015  . Status post amputation of left great toe (HCC) 06/17/2015  . COPD, moderate (HCC) 06/17/2015  . History of tobacco use 06/17/2015  . Depression 06/17/2015  . OSA (obstructive sleep apnea) 04/30/2015  . Morbid obesity (HCC) 04/30/2015  . Chronic renal insufficiency, stage IV (severe) 04/30/2015  . Chronic diastolic heart failure (HCC) 02/12/2015  . Type 2 diabetes mellitus with diabetic neuropathy (HCC) 04/16/2014  . Obesity, diabetes, and hypertension syndrome (HCC) 04/16/2014    Past Surgical History  Procedure Laterality Date  . Toe amputation Right   . Foot surgery Right   . Tonsillectomy    .  Abdominal hernia repair  1989  . Cardiac catheterization      [facility] no stents    Family History  Problem Relation Age of Onset  . Cancer Mother     Pancreatic  . Diabetes Sister   . Kidney disease Sister   . Diabetes Maternal Grandmother   . Heart disease Sister     Heart Failure    Social History   Social History  . Marital Status: Married    Spouse Name: N/A  . Number of Children: N/A  . Years of Education: N/A   Occupational History  . Not on file.   Social History Main Topics  . Smoking status: Former Smoker -- 2.00 packs/day for 35 years    Types: Cigarettes    Quit date: 11/08/2010  . Smokeless tobacco: Never Used  . Alcohol Use: No  . Drug Use: No  . Sexual Activity: Yes   Other Topics Concern  . Not on file   Social History Narrative     Current outpatient prescriptions:  .  acetaminophen-codeine (TYLENOL #3) 300-30 MG per tablet, Take 1 tablet by mouth 2 (two) times daily., Disp: , Rfl:  .  amitriptyline (ELAVIL) 25 MG tablet, Take 2 tablets by mouth at bedtime., Disp: , Rfl:  .  aspirin 81 MG tablet, Take 81 mg by mouth daily., Disp: , Rfl:  .  baclofen (LIORESAL) 20 MG tablet, TAKE 1 TABLET AT BEDTIME AS NEEDED  FOR  SPASMS, Disp: 90 tablet, Rfl: 1 .  carvedilol (COREG) 6.25 MG tablet, Take 1 tablet by mouth 2 (two) times daily., Disp: , Rfl:  .  docusate sodium (COLACE) 100 MG capsule, Take 100 mg by mouth 2 (two) times daily as needed for mild constipation., Disp: , Rfl:  .  gemfibrozil (LOPID) 600 MG tablet, Take 1 tablet by mouth 2 (two) times daily., Disp: , Rfl:  .  glipiZIDE (GLUCOTROL) 10 MG tablet, Take 1 tablet by mouth daily before breakfast. , Disp: , Rfl:  .  glucose blood test strip, , Disp: , Rfl:  .  insulin aspart (NOVOLOG) 100 UNIT/ML injection, Inject into the skin 3 (three) times daily before meals. As needed per sliding scale, Disp: , Rfl:  .  insulin detemir (LEVEMIR) 100 UNIT/ML injection, Inject 60 Units into the skin Nightly.,  Disp: , Rfl:  .  lactulose (CHRONULAC) 10 GM/15ML solution, Take 15 mLs by mouth 2 (two) times daily. , Disp: , Rfl:  .  metolazone (ZAROXOLYN) 5 MG tablet, Take 1 tablet by mouth daily as needed. , Disp: , Rfl:  .  potassium chloride SA (K-DUR,KLOR-CON) 20 MEQ tablet, Take 20 mEq by mouth 2 (two) times daily., Disp: , Rfl:  .  torsemide (DEMADEX) 100 MG tablet, Take 1 tablet (100 mg total) by mouth 2 (  two) times daily., Disp: 180 tablet, Rfl: 1 .  venlafaxine XR (EFFEXOR-XR) 75 MG 24 hr capsule, Take 1 capsule by mouth 2 (two) times daily. , Disp: , Rfl:  .  Insulin Detemir (LEVEMIR) 100 UNIT/ML Pen, Inject 45 Units into the skin daily at 10 pm. , Disp: , Rfl:  .  Liraglutide 18 MG/3ML SOPN, Inject 1.6 mg into the skin daily., Disp: , Rfl:   Allergies  Allergen Reactions  . Statins Swelling and Other (See Comments)    "Took strength out of legs"; rhabdomyolisis  . Ivp Dye [Iodinated Diagnostic Agents]   . Penicillins Rash     ROS  Constitutional: Negative for fever or weight change.  Respiratory:Positive for cough and shortness of breath.   Cardiovascular: Negative for chest pain or palpitations.  Gastrointestinal: Negative for abdominal pain, no bowel changes - still has intermittent constipation .  Musculoskeletal: Positive  for gait problem ( uses cane ) no joint swelling.  Skin: Negative for rash.  Neurological: Positive  for dizziness no headache.  No other specific complaints in a complete review of systems (except as listed in HPI above).  Objective  Filed Vitals:   10/14/15 1359  BP: 124/72  Pulse: 66  Temp: 97.4 F (36.3 C)  TempSrc: Oral  Resp: 16  Height:  (1.778 m)  Weight: 266 lb (120.657 kg)  SpO2: 93%    Body mass index is 38.17 kg/(m^2).  Physical Exam  Constitutional: Patient appears well-developed and well-nourished. Obese  No distress.  HEENT: head atraumatic, normocephalic, pupils equal and reactive to light,  neck supple, throat within  normal limits Cardiovascular: Normal rate, regular rhythm and normal heart sounds.  No murmur heard. No BLE edema. Pulmonary/Chest: Effort normal and breath sounds showed right lower base coarse crackles and wheezing, no respiratory distress. Abdominal: Soft.  There is no tenderness. Large ventral hernia, reducible Psychiatric: Patient has a normal mood and affect. behavior is normal. Judgment and thought content normal.  PHQ2/9: Depression screen [facility] 2/9 10/10/2015 07/09/2015 06/17/2015 04/08/2015  Decreased Interest 1 0 0 1  Down, Depressed, Hopeless 3 3 0 3  PHQ - 2 Score 4 3 0 4  Altered sleeping 3 1 - 1  Tired, decreased energy 3 3 - 0  Change in appetite 1 0 - 1  Feeling bad or failure about yourself  3 3 - 1  Trouble concentrating 0 2 - 0  Moving slowly or fidgety/restless 3 1 - 0  Suicidal thoughts 1 1 - 0  PHQ-9 Score 18 14 - 7  Difficult doing work/chores Not difficult at all Not difficult at all - Not difficult at all     Fall Risk: Fall Risk  10/10/2015 07/09/2015 06/17/2015 04/08/2015 02/12/2015  Falls in the past year? Yes Yes Yes Yes Yes  Number falls in past yr: 2 or more 2 or more 1 2 or more 2 or more  Injury with Fall? No No No No -  Risk Factor Category  - High Fall Risk - High Fall Risk -  Risk for fall due to : History of fall(s) - - History of fall(s);Impaired balance/gait History of fall(s)  Follow up - Falls prevention discussed - Falls prevention discussed -     Functional Status Survey: Is the patient deaf or have difficulty hearing?: No Does the patient have difficulty seeing, even when wearing glasses/contacts?: Yes (glasses) Does the patient have difficulty concentrating, remembering, or making decisions?: No Does the patient have difficulty walking or climbing  stairs?: Yes (in a wheelchair) Does the patient have difficulty dressing or bathing?: No Does the patient have difficulty doing errands alone such as visiting a doctor's office or shopping?:  No    Assessment & Plan  1. Chronic diastolic heart failure (HCC)  Continue beta-blocker and aspirin daily. He sees cardiologist - Dr. Mariah Milling - Comprehensive metabolic panel  2. PVD (peripheral vascular disease) [facility])  He sees podiatrist, not current seeing vascular doctor.   3. COPD, moderate (HCC)  With an exacerbation, we will give him a inhaler voucher, already at the tail end of infection, getting better, we will hold off on antibiotics  4. Controlled type 2 diabetes mellitus with stage 4 chronic kidney disease, with long-term current use of insulin (HCC)  - Hemoglobin A1c - Ambulatory referral to Ophthalmology  5. Hyperparathyroidism, secondary renal (HCC)  Continue follow up with Dr. Ronn Melena  6. Status post amputation of left great toe (HCC)  Continue follow up with Dr. Orland Jarred, secondary to osteomyelitis, vascular disease and neuropathy  7. Morbid obesity, unspecified obesity type (HCC)  Discussed importance of weight loss and healthy eating  8. Obesity, diabetes, and hypertension syndrome (HCC)  Continue medication, not on ACE/ARB, bp towards low end of normal - sees nephrologist  9. Oxygen deficit  Uses prn at home, recently had to use it because of COPD flare  10. Hyperlipidemia  - Lipid panel  11. Anemia of chronic disease  - Hematocrit  12. Chronic renal insufficiency, stage IV (severe)  - Comprehensive metabolic panel - Phosphorus - Protein / Creatinine Ratio, Urine  13. Type 2 diabetes mellitus with diabetic neuropathy, with long-term current use of insulin (HCC)  - Hemoglobin A1c - Ambulatory referral to Ophthalmology  14. Needs flu shot  - Flu Vaccine QUAD 36+ mos IM  15. Need for pneumococcal vaccination  - Pneumococcal polysaccharide vaccine 23-valent greater than or equal to 2yo subcutaneous/IM  16. Chronic liver failure without hepatic coma (HCC)  Stable on lactulose qhs. No symptoms at this time.   17. Major depression,  recurrent, chronic (HCC)  Getting worse, we will increase dose, discussed psychiatrist referral but he refuses at this time - venlafaxine XR (EFFEXOR-XR) 75 MG 24 hr capsule; Take 3 capsules (225 mg total) by mouth daily.  Dispense: 270 capsule; Refill: 1   18. Abnormal lung sounds  He has been sick for 3 weeks, worsening of cough and fatigue, possible CAP, allergic to [facility] and has CKI stage IV we will try Septra, follow up in one week - DG Chest 2 View; Future - sulfamethoxazole-trimethoprim (BACTRIM DS,SEPTRA DS) 800-160 MG tablet; Take 1 tablet by mouth 2 (two) times daily.  Dispense: 14 tablet; Refill: 0

## 2015-10-15 ENCOUNTER — Encounter: Payer: Self-pay | Admitting: *Deleted

## 2015-10-15 ENCOUNTER — Other Ambulatory Visit: Payer: Self-pay | Admitting: *Deleted

## 2015-10-15 NOTE — Patient Outreach (Signed)
[facility](THN) Care Management  10/15/2015  Carl Perry [birth year] [id number]   Subjective:  Telephone call to patient.   HIPAA verified.  Patient's states he has had a good day and was able to get out of the house today.   States he is feeling much better today.   States he and his wife both saw Dr. Carlynn PurlSowles on 10/14/15.   States MD stated that he may "a touch of walking pneumonia".  Patient states he is waiting for MD to call him with the Chest X-ray results.   RNCM advised patient that she had [facility] and received a verbal update on the SW home visit on 10/14/15.  Patient voices that he wishes to continue to receive [facility] services.  Assessment: [employer] HMO tier 4 list referral. Patient enrolled in [facility] Congestive [facility] Failure education, care coordination for depression screening follow up, and care coordination for scale purchase for congestive heart failure status monitoring.   Plan:  RNCM will call patient for telephonic outreach for initial assessment follow up within 3 business days. RNCM will follow up with patient within 3 business days regarding new scale purchase. RNCM will follow up with patient regarding MD's assessment of current depression medications within 3 business days.  RNCM will verify with patient that he has received a 2017 [facility] Calendar within 2 weeks.   Carl Scripter H. Gardiner Barefootooper RN, BSN, CCM [facility] Care [facility] Telephonic CM Phone: [phone] Fax: [phone]

## 2015-10-16 ENCOUNTER — Other Ambulatory Visit: Payer: Self-pay | Admitting: *Deleted

## 2015-10-16 NOTE — Patient Outreach (Signed)
[facility](THN) Care Management  10/16/2015  Haynes HoehnMichael E Neville 1956/06/29 [id number]   Subjective:  Telephone call to patient.   HIPAA verified.    Patient states he has had a good day.    Patient states he did received the [facility].   Patient states he has not received the result of the chest Xray from his most recent MD appointment (10/14/15).   States MD started him on an antibiotics and he does not know the name of it right now.   Patient states he will have his meds available during the RNCM's follow up call tomorrow.  Patient states he did discuss his depression with Dr. Carlynn PurlSowles on 10/14/15 and discussed strategies to use going forward.   States he is tired of sitting around.   Patient also discussed depression strategies with [facility] Social Worker during 10/14/15 home visit.   Patient states his MD appointment with Dr. Carlynn PurlSowles went well and no additional meds prescribed except antibiotics.  Patient states his scale is now working and he did not have to purchase a new scale.   RNCM  reinforced the importance of daily weights and recording weights.   Patient verbalized understanding and is in agreement.  Patient states today is the first day that he has weighed and recorded weight in [facility] calendar in over a month.      Patient states he had also discussed his recent fall with Dr. Carlynn PurlSowles and [facility] Social Worker.    Patient states he is interested in receiving community resource information on Med Alert services due to falls and being home at alone during the day.  RNCM advised patient she will follow up and notify him of available Med Alert services.  Patient states he is also looking into supplements to increase his energy and has discussed with Dr. Carlynn PurlSowles.  Patient in agreement to continue to receive [facility] Care Management services.   Assessment: [employer] HMO tier 4 list referral. Patient enrolled in [facility]est[facility] education, care coordination for depression screening follow  up, and care coordination for scale purchase for congestive heart failure status monitoring.  Plan:  RNCM will call patient for telephonic outreach for initial assessment follow up within 3 business days. RNCM will follow up with patient within 3 business days regarding daily weight recordings. RNCM will follow up on patient's request for information on Med Alert services within 1 week.    Adian Jablonowski H. Gardiner Barefootooper RN, BSN, CCM [facility] Care Management [facility] Telephonic CM Phone: [phone] Fax: [phone]

## 2015-10-20 ENCOUNTER — Other Ambulatory Visit: Payer: Self-pay | Admitting: *Deleted

## 2015-10-20 NOTE — Patient Outreach (Signed)
[facility](THN) Care Management  10/20/2015  Carl HoehnMichael E Perry [birth year] [id number]   Subjective:  Telephone call to patient's local pharmacy [facility] [phone]), per patient verbal authorization.   HIPAA verified.  Spoke with Lupita LeashDonna who also consulted with [facility] regarding the prescription fill issue.   Per Lupita Leashonna the [facility] was for Bactrim DS 800mg - 160 1 tablet by mouth twice daily.    Lupita LeashDonna states per Aleda GranaYasmine, Dr. Carlynn PurlSowles office [facility].   States [name] at Dr. Carlynn PurlSowles office  (primary MD office) [facility] to [facility] and [facility] on patient's behalf and was given authorization to fill 7 tablets of the prescription and the remaining 7 tablets would be [facility].   Belva Bertindvised Donna that patient has not received the remaining medication from [facility] and had the last dose of the medication on Saturday 10/18/15 morning.  RNCM advised Lupita LeashDonna that [facility] will [facility] and providers office regarding this issue as needed.   [facility].  Spoke with Kathryne HitchLondon, HIPAA verified and RNCM advised her of [facility] Care Management services relationship with [employer].    London states per her supervisor, she is unable to speak with RNCM regarding patient information without provider's NPI number.  RNCM obtained ordering provider's (Dr. Alba CoryKrichna Sowles)  NPI number  ([id number]).   RNCM called [employer] Mail order pharmacy and spoke with Gerilyn PilgrimJacob.   HIPAA verified.  RNCM advised Gerilyn PilgrimJacob of patient's issue with Bactrim prescription fill and currently patient is without medication.  Spoke with Gerilyn PilgrimJacob who states patient's Bactrim was delivered to patient's home mailbox today at 3:00pm per the mail tracking system.   States [employer] sent the full prescription amount of 14 tablets to the  patient.   States if patient did not receive the medication by 10/21/15 then the providers office could send remaining prescription amount to the local pharmacy for fill.   If the remaining prescription is filled at the local pharmacy then patient could send the [facility]opened [facility] Bactrim back to [facility] for a refund if needed.    Telephone call to patient.  HIPAA verified.  RNCM advised patient of above conversations regarding Bactrim prescription fill.   Patient states his wife will bring the mail in from the mailbox when she gets home this evening and he will verify if the prescription is received.   Patient verbalized understanding and is in agreement with the follow up plan if the medication has not been received.   Patient in agreement with continued [facility] services.   Assessment: [employer] HMO tier 4 list referral. Patient enrolled in [facility] Congestive [facility] Failure education, care coordination for depression screening follow up, and care coordination for scale purchase for congestive heart failure status monitoring. Patient is taking and recording daily weights. Patient reports he currently has reliable scales for daily weights. Patient has plan in place for obtaining remaining antibiotics.   Plan: RNCM will follow up with patient and/or  providers office regarding antibiotic prescription fill within 3 business days.  RNCM call patient to complete assessment within 1 week. RNCM will follow up on patient's request for information on Med Alert services within 2 weeks.  RNCM will send patient advanced directive forms within 2 weeks.  Laronica Bhagat H. Excell Seltzerooper Charity fundraiserN, BSN, CCM [facility] Care Management [facility] Telephonic CM Phone:  [phone] Fax: [phone]

## 2015-10-20 NOTE — Patient Outreach (Signed)
[facility](THN) Care Management  10/20/2015  Carl Perry Carl Perry 1956-03-30 [id number]  Subjective: Telephone call to patient.   HIPAA verified.  Patient states he is feeling pretty good today.    States he is very upset that he is currently out of his antibiotics because [employer] has refused to authorize to fill the entire prescription through his local pharmacy  [facility] [phone]).   Patient states he took the last dose of the medication on 10/18/15.   States the prescription for Bactrim DS 800 - 160 mg 1 tablet twice daily, was filled for 7 tablets only and [employer] was suppose to send him the remaining through the mail.   Patient states he called [employer] on  10/15/15 to advise them that he was out of his antibiotic and states the person on the phone did know why the full prescription was not filled at the local pharmacy since it was not a maintenance medication.  Patient did not know if [employer] or his local pharmacy was going to do anything about this issue.   [facility] contact person and is aware of the issue.  Patient states he had plan to call someone today about this issue.   Patient states that [facility] can call the pharmacy, [employer] and provider's office to obtain additional information regarding the prescription refill issue.   Patient states he is interested in obtaining the Advanced Directive forms from [facility].   RNCM advised patient that RNCM will follow up and ask for forms to be sent to patient.  Patient states he will follow up with his lawyer regarding financial power of attorney forms.  Patient states he recording weights in [facility] Patient calendar and able to advise RNCM of recordings as needed.    Patient states he monitors his blood sugars daily and is able to manage his diabetes.   Patients he is aware of his signs and symptoms of low blood sugar and does the appropriate interventions.   States he has also [facility] regarding additional blood  glucose testing strips.  Patient states that he has to go right now, will talk with RNCM at a later time, because he currently has a visitor.  Assessment: [employer] HMO tier 4 list referral. Patient enrolled in [facility] Congestive Heart Failure education, care coordination for depression screening follow up, and care coordination for scale purchase for congestive heart failure status monitoring.  Patient is taking and recording daily weights.   Patient reports he currently has reliable scales for daily weights.   Plan:  RNCM will follow up with patient's local pharmacy and providers office regarding antibiotic prescription within 3  business days.  RNCM call patient to complete assessment within 1 week. RNCM will follow up on patient's request for information on Med Alert services within 2 weeks.  RNCM will send patient advanced directive forms within 2 weeks.    Shashwat Cleary H. Gardiner Barefootooper RN, BSN, CCM [facility] Care Management [facility] Telephonic CM Phone: [phone] Fax: [phone]

## 2015-10-21 ENCOUNTER — Other Ambulatory Visit: Payer: Self-pay | Admitting: *Deleted

## 2015-10-21 LAB — BASIC METABOLIC PANEL
GLUCOSE: 88 mg/dL
POTASSIUM: 4.3 mmol/L (ref 3.4–5.3)

## 2015-10-21 LAB — LIPID PANEL
Cholesterol: 122 mg/dL (ref 0–200)
HDL: 32 mg/dL — AB (ref 35–70)
LDL CALC: 52 mg/dL
Triglycerides: 190 mg/dL — AB (ref 40–160)

## 2015-10-21 LAB — HEPATIC FUNCTION PANEL
ALK PHOS: 92 U/L (ref 25–125)
ALT: 5 U/L — AB (ref 10–40)
AST: 9 U/L — AB (ref 14–40)

## 2015-10-21 LAB — HEMOGLOBIN A1C: HEMOGLOBIN A1C: 6

## 2015-10-21 NOTE — Patient Outreach (Signed)
[facility](THN) Care Management  10/21/2015  Haynes HoehnMichael E Hook 1956/04/26 [id number]  Subjective: Telephone call to patient, no answer, left HIPAA compliant voicemail message for patient and requested call back.  Assessment: [employer] HMO tier 4 list referral. Patient enrolled in [facility] Congestive Heart Failure education, care coordination for depression screening follow up, and care coordination for scale purchase for congestive heart failure status monitoring. Patient in the process of completing antibiotic regimen as prescribed once remaining 7 tablets [facility].   Plan: RNCM will follow up with patient and/or providers office regarding antibiotic prescription fill within 3 business days, if no return call from patient.  RNCM call patient to complete assessment within 1 week. RNCM will follow up on patient's request for information on Med Alert services within 2 weeks.  RNCM will follow up with patient to verify receipt of advanced directive forms within 3 weeks.  Katelyn Kohlmeyer H. Gardiner Barefootooper RN, BSN, CCM [facility] Care Management [facility] Telephonic CM Phone: [phone] Fax: [phone]

## 2015-10-22 ENCOUNTER — Other Ambulatory Visit: Payer: Self-pay | Admitting: Family Medicine

## 2015-10-23 ENCOUNTER — Other Ambulatory Visit: Payer: Self-pay | Admitting: *Deleted

## 2015-10-23 ENCOUNTER — Encounter: Payer: Self-pay | Admitting: *Deleted

## 2015-10-23 ENCOUNTER — Ambulatory Visit: Payer: Commercial Managed Care - HMO | Admitting: Family Medicine

## 2015-10-23 NOTE — Patient Outreach (Addendum)
[facility] Care Management  Carl Perry  10/23/2015   Carl Perry 06-16-1956 [id number]  Subjective: Telephone call to patient.  HIPAA verified.  Patient states he is doing great.   States he received his mail order prescription of Bactrim on Monday 10/20/15 and has resumed taking it twice a day as prescribed.   States he spoke with his primary MD's office (Dr. Ancil Boozer) and the MD is aware of prescription fill issue.  States MD is aware that patient was without medication for approximately 4 doses and that patient has received mail order prescription.   Patient states that MD told him to take the entire mail order prescription fill ( 14 doses).  RNCM advised patient of the usual protocol for mail order and retail pharmacy order.  Patient verbalizes understanding and states he is appreciative of RNCM assistance with this issue.  States his wife works at [employer] and the [employer], takes good care of him.    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  States he is also having some blurry vision in right eye due to blood vessel issue.  States this is the earliest appointment that he could get, in order for his wife to go with him.  States his wife is only off from work on Tuesdays and Wednesdays.  Patient states he is thinking about trying some adjustable reading glasses in the near future.   Patient states he still has not received the results of the chest Xray that was performed on 10/14/15.  States he was told by Dr. Ancil Boozer during office visit that he probably has "walking pneumonia".   RNCM advised patient to follow up with Dr. Ancil Boozer office regarding the results.   Patient in agreement,  states he will follow up on getting results.  States he will also get his password reset for online access to his labs and test results through the provider's patient portal.   RNCM  advised patient that Advanced Directives forms are being mailed to him per his request.  Patient states he will let RNCM know when he has receives the forms.    Patient states he has received community resource information from [place] regarding dental services at [facility] and is planning to follow up.  Patient states he is aware of how important good dental care is and how it can affect his health.    Patient states his podiatrist is Dr. Albertine Patricia at the [facility] and he is planning to call provider's office  to set up an appointment.  States all of his providers do foot exams during his office visits.   Patient states his wife takes care of his feet daily.    Patient states he takes all of his medications as prescribed.   States he know the signs and symptoms of low blood sugar.  States he treatment low blood sugar symptoms as directed.  Patient states he is dealing with his depression and taken medications as prescribed. States he is dealing with the side effects (dry mouth and fatigue).    RNCM advised patient of Med Alert community resource information.  Patient states he would like to receive the brochure and will follow up to obtain service.   RNCM advised patient RNCM will request that brochure be mailed to patient. Patient states he had his right great toe  amputated approximately 2 years ago and he is still a little wobbly on his feet.   Patient states he does not have any taste buds due to medications and a combination of issues.  Patient states he is able to eat and follows his diabetic and low salt diet.  States he loves to cook and is looking forward to cooking for the holidays.  RNCM reinforced with patient the importance of having congestive heart failure action plan and knowing his current zone.  Patient verbalizes understanding and states he will do whatever he needs to to stay out of the hospital.  States he hates going to the hospital, he has really bad  veins and does not like to be struck for IV access.   Patient agreement to receiving [facility] congestive heart failure zone and action plan document.   Patient in agreement to continue to receive [place] Management services.  Patient does not feel like he will need any disease management services once care coordination needs are completed and goals met.   Patient states he "knows what to do".      Objective:   Current Medications:  Current Outpatient Prescriptions  Medication Sig Dispense Refill  . acetaminophen-codeine (TYLENOL #3) 300-30 MG tablet Take 1 tablet by mouth every 8 (eight) hours as needed for moderate pain. 60 tablet 0  . amitriptyline (ELAVIL) 25 MG tablet Take 2 tablets by mouth at bedtime.    Marland Kitchen aspirin 81 MG tablet Take 81 mg by mouth daily.    . baclofen (LIORESAL) 20 MG tablet TAKE 1 TABLET AT BEDTIME AS NEEDED  FOR  SPASMS 90 tablet 1  . carvedilol (COREG) 6.25 MG tablet Take 1 tablet by mouth 2 (two) times daily.    Marland Kitchen docusate sodium (COLACE) 100 MG capsule Take 100 mg by mouth 2 (two) times daily as needed for mild constipation.    Marland Kitchen gemfibrozil (LOPID) 600 MG tablet Take 1 tablet by mouth 2 (two) times daily.    Marland Kitchen glipiZIDE (GLUCOTROL) 10 MG tablet Take 1 tablet by mouth daily before breakfast.     . glucose blood test strip     . insulin aspart (NOVOLOG) 100 UNIT/ML injection Inject into the skin 3 (three) times daily before meals. As needed per sliding scale    . insulin detemir (LEVEMIR) 100 UNIT/ML injection Inject 60 Units into the skin Nightly.    . Insulin Detemir (LEVEMIR) 100 UNIT/ML Pen Inject 45 Units into the skin daily at 10 pm.     . lactulose (CHRONULAC) 10 GM/15ML solution Take 15 mLs by mouth 2 (two) times daily.     . Liraglutide 18 MG/3ML SOPN Inject 1.6 mg into the skin daily.    . metolazone (ZAROXOLYN) 5 MG tablet Take 1 tablet by mouth daily as needed.     . potassium chloride SA (K-DUR,KLOR-CON) 20 MEQ tablet Take 20 mEq by mouth 2 (two) times  daily.    Marland Kitchen sulfamethoxazole-trimethoprim (BACTRIM DS,SEPTRA DS) 800-160 MG tablet Take 1 tablet by mouth 2 (two) times daily. 14 tablet 0  . torsemide (DEMADEX) 100 MG tablet Take 1 tablet (100 mg total) by mouth 2 (two) times daily. 180 tablet 1  . venlafaxine XR (EFFEXOR-XR) 75 MG 24 hr capsule Take 3 capsules (225 mg total) by mouth daily. 270 capsule 1   No current facility-administered medications for this visit.    Functional Status:  In your present state of health, do you have any difficulty performing the following activities: 10/23/2015  10/14/2015  Hearing? N N  Vision? Y Y  Difficulty concentrating or making decisions? N N  Walking or climbing stairs? Y Y  Dressing or bathing? N N  Doing errands, shopping? Y N  Preparing Food and eating ? Y -  Using the Toilet? Y -  In the past six months, have you accidently leaked urine? - -  Do you have problems with loss of bowel control? - -  Managing your Medications? N -  Managing your Finances? N -  Housekeeping or managing your Housekeeping? N -    Fall/Depression Screening: PHQ 2/9 Scores 10/10/2015 07/09/2015 06/17/2015 04/08/2015 02/12/2015  PHQ - 2 Score 4 3 0 4 -  PHQ- 9 Score 18 14 - 7 -  Exception Documentation - - - - (No Data)   Fall Risk  10/10/2015 07/09/2015 06/17/2015 04/08/2015 02/12/2015  Falls in the past year? Yes Yes Yes Yes Yes  Number falls in past yr: 2 or more 2 or more 1 2 or more 2 or more  Injury with Fall? No No No No -  Risk Factor Category  - High Fall Risk - High Fall Risk -  Risk for fall due to : History of fall(s) - - History of fall(s);Impaired balance/gait History of fall(s)  Follow up - Falls prevention discussed - Falls prevention discussed -   Assessment: [employer] HMO tier 4 list referral. Patient enrolled in [facility] Congestive Heart Failure education, care coordination for depression screening follow up, and care coordination for scale purchase for congestive heart failure status monitoring. Patient in the  process of completing antibiotic treatment.  Plan: RNCM will call patient  to follow up regarding the antibiotic treatment completion within 1 week. RNCM will call patient to obtain update on chest Xray results within 1 week.  RNCM will call patient to complete bowel and bladder assessment within 1 week. RNCM will send patient [facility] consent form and congestive heart failure zone action plan magnet within 1 week.  RNCM will send patient information on Med Alert services within 2 weeks. RNCM will follow up with patient to verify receipt of [facility] consent form within 2 weeks.   RNCM will follow up with patient to verify receipt of Med Alert community resource information within 3 weeks.  RNCM will follow up with patient to verify receipt of advanced directive forms within 3 weeks. RNCM will follow up with patient to obtain update on eye MD appointment within 3 weeks.   Delylah Stanczyk H. Annia Friendly, BSN, [place] Telephonic CM Phone: [phone]

## 2015-10-24 ENCOUNTER — Encounter: Payer: Self-pay | Admitting: *Deleted

## 2015-10-29 ENCOUNTER — Ambulatory Visit: Payer: Commercial Managed Care - HMO | Attending: Family | Admitting: Family

## 2015-10-29 ENCOUNTER — Encounter: Payer: Self-pay | Admitting: Family

## 2015-10-29 VITALS — BP 148/63 | HR 65 | Resp 20 | Ht 70.0 in | Wt 283.0 lb

## 2015-10-29 DIAGNOSIS — E1140 Type 2 diabetes mellitus with diabetic neuropathy, unspecified: Secondary | ICD-10-CM

## 2015-10-29 DIAGNOSIS — Z794 Long term (current) use of insulin: Secondary | ICD-10-CM | POA: Diagnosis not present

## 2015-10-29 DIAGNOSIS — I5032 Chronic diastolic (congestive) heart failure: Secondary | ICD-10-CM | POA: Diagnosis present

## 2015-10-29 DIAGNOSIS — R5383 Other fatigue: Secondary | ICD-10-CM | POA: Diagnosis present

## 2015-10-29 DIAGNOSIS — J449 Chronic obstructive pulmonary disease, unspecified: Secondary | ICD-10-CM | POA: Diagnosis not present

## 2015-10-29 DIAGNOSIS — G4730 Sleep apnea, unspecified: Secondary | ICD-10-CM | POA: Insufficient documentation

## 2015-10-29 DIAGNOSIS — Z79899 Other long term (current) drug therapy: Secondary | ICD-10-CM | POA: Insufficient documentation

## 2015-10-29 DIAGNOSIS — E785 Hyperlipidemia, unspecified: Secondary | ICD-10-CM | POA: Insufficient documentation

## 2015-10-29 DIAGNOSIS — Z9889 Other specified postprocedural states: Secondary | ICD-10-CM | POA: Insufficient documentation

## 2015-10-29 DIAGNOSIS — N189 Chronic kidney disease, unspecified: Secondary | ICD-10-CM | POA: Diagnosis not present

## 2015-10-29 DIAGNOSIS — E119 Type 2 diabetes mellitus without complications: Secondary | ICD-10-CM | POA: Diagnosis not present

## 2015-10-29 DIAGNOSIS — Z7982 Long term (current) use of aspirin: Secondary | ICD-10-CM | POA: Diagnosis not present

## 2015-10-29 DIAGNOSIS — Z6841 Body Mass Index (BMI) 40.0 and over, adult: Secondary | ICD-10-CM | POA: Insufficient documentation

## 2015-10-29 DIAGNOSIS — F17210 Nicotine dependence, cigarettes, uncomplicated: Secondary | ICD-10-CM | POA: Insufficient documentation

## 2015-10-29 DIAGNOSIS — R5382 Chronic fatigue, unspecified: Secondary | ICD-10-CM

## 2015-10-29 DIAGNOSIS — F339 Major depressive disorder, recurrent, unspecified: Secondary | ICD-10-CM | POA: Diagnosis not present

## 2015-10-29 DIAGNOSIS — I129 Hypertensive chronic kidney disease with stage 1 through stage 4 chronic kidney disease, or unspecified chronic kidney disease: Secondary | ICD-10-CM | POA: Diagnosis not present

## 2015-10-29 NOTE — Progress Notes (Signed)
Subjective:    Patient ID: Carl Perry, male    DOB: April 12, 1956, 59 y.o.   MRN: [id number]  Congestive Heart Failure Presents for follow-up visit. The disease course has been stable. Associated symptoms include edema, fatigue (all the time) and shortness of breath. Pertinent negatives include no abdominal pain, chest pain, orthopnea or palpitations. The symptoms have been stable. Past treatments include beta blockers, salt and fluid restriction and oxygen. The treatment provided moderate relief. Compliance with prior treatments has been good. His past medical history is significant for chronic lung disease, DM and HTN. Compliance with total regimen is 76-100%.  Other This is a chronic (fatigue) problem. The current episode started more than 1 month ago. The problem occurs constantly. The problem has been unchanged. Associated symptoms include congestion, coughing (sometimes), fatigue (all the time), neck pain and numbness (in feet due to neuropathy). Pertinent negatives include no abdominal pain, chest pain, headaches, sore throat or weakness. The symptoms are aggravated by walking and eating. He has tried rest for the symptoms. The treatment provided no relief.    Past Medical History  Diagnosis Date  . COPD (chronic obstructive pulmonary disease) (HCC)   . Diabetes mellitus without complication (HCC)   . Hypertension   . CKD (chronic kidney disease)   . Sleep apnea   . Rhabdomyolysis   . Hyperlipidemia   . Morbid obesity (HCC)   . Depression   . CHF (congestive heart failure) (HCC)   . Allergy     Past Surgical History  Procedure Laterality Date  . Toe amputation Right   . Foot surgery Right   . Tonsillectomy    . Abdominal hernia repair  1989  . Cardiac catheterization      [facility] no stents    Family History  Problem Relation Age of Onset  . Cancer Mother     Pancreatic  . Diabetes Sister   . Kidney disease Sister   . Diabetes Maternal Grandmother   . Heart disease  Sister     Heart Failure  . Cancer Maternal Aunt   . Diabetes Sister   . Cancer Maternal Aunt   . Cancer Maternal Aunt     Social History  Substance Use Topics  . Smoking status: Former Smoker -- 2.00 packs/day for 35 years    Types: Cigarettes    Quit date: 11/08/2010  . Smokeless tobacco: Never Used  . Alcohol Use: No    Allergies  Allergen Reactions  . Statins Swelling and Other (See Comments)    "Took strength out of legs"; rhabdomyolisis  . Ivp Dye [Iodinated Diagnostic Agents]   . Penicillins Rash    Prior to Admission medications   Medication Sig Start Date End Date Taking? Authorizing Provider  acetaminophen-codeine (TYLENOL #3) 300-30 MG tablet Take 1 tablet by mouth every 8 (eight) hours as needed for moderate pain. 10/14/15  Yes Alba CoryKrichna Sowles, MD  amitriptyline (ELAVIL) 25 MG tablet Take 2 tablets by mouth at bedtime. 08/29/15  Yes Historical Provider, MD  aspirin 81 MG tablet Take 81 mg by mouth daily.   Yes Historical Provider, MD  baclofen (LIORESAL) 20 MG tablet TAKE 1 TABLET AT BEDTIME AS NEEDED  FOR  SPASMS 08/09/15  Yes Alba CoryKrichna Sowles, MD  carvedilol (COREG) 6.25 MG tablet Take 1 tablet by mouth 2 (two) times daily. 04/06/14  Yes Historical Provider, MD  docusate sodium (COLACE) 100 MG capsule Take 100 mg by mouth 2 (two) times daily as needed for mild constipation.  Yes Historical Provider, MD  gemfibrozil (LOPID) 600 MG tablet Take 1 tablet by mouth 2 (two) times daily. 09/08/14  Yes Historical Provider, MD  glipiZIDE (GLUCOTROL) 10 MG tablet Take 1 tablet by mouth daily before breakfast.  01/27/15  Yes Historical Provider, MD  glucose blood test strip  06/17/15  Yes Historical Provider, MD  insulin aspart (NOVOLOG) 100 UNIT/ML injection Inject into the skin 3 (three) times daily before meals. As needed per sliding scale   Yes Historical Provider, MD  insulin detemir (LEVEMIR) 100 UNIT/ML injection Inject 60 Units into the skin Nightly. 07/22/15  Yes Historical  Provider, MD  Insulin Detemir (LEVEMIR) 100 UNIT/ML Pen Inject 45 Units into the skin daily at 10 pm.    Yes Historical Provider, MD  lactulose (CHRONULAC) 10 GM/15ML solution Take 15 mLs by mouth 2 (two) times daily.  12/04/14  Yes Historical Provider, MD  Liraglutide 18 MG/3ML SOPN Inject 1.6 mg into the skin daily.   Yes Historical Provider, MD  metolazone (ZAROXOLYN) 5 MG tablet Take 1 tablet by mouth daily as needed.  12/09/14  Yes Historical Provider, MD  potassium chloride SA (K-DUR,KLOR-CON) 20 MEQ tablet Take 20 mEq by mouth 2 (two) times daily.   Yes Historical Provider, MD  sulfamethoxazole-trimethoprim (BACTRIM DS,SEPTRA DS) 800-160 MG tablet Take 1 tablet by mouth 2 (two) times daily. 10/14/15  Yes Alba Cory, MD  torsemide (DEMADEX) 100 MG tablet Take 1 tablet (100 mg total) by mouth 2 (two) times daily. 08/14/15  Yes Alba Cory, MD  venlafaxine XR (EFFEXOR-XR) 75 MG 24 hr capsule Take 3 capsules (225 mg total) by mouth daily. 10/14/15  Yes Alba Cory, MD     Review of Systems  Constitutional: Positive for fatigue (all the time). Negative for appetite change.  HENT: Positive for congestion and nosebleeds (when wearing oxygen). Negative for rhinorrhea and sore throat.   Eyes: Negative.   Respiratory: Positive for cough (sometimes), chest tightness (at times) and shortness of breath.   Cardiovascular: Positive for leg swelling. Negative for chest pain and palpitations.  Gastrointestinal: Negative for abdominal pain and abdominal distention.       Heartburn on occasion  Endocrine: Negative.   Genitourinary: Negative.   Musculoskeletal: Positive for back pain and neck pain.  Skin: Negative.   Allergic/Immunologic: Negative.   Neurological: Positive for dizziness and numbness (in feet due to neuropathy). Negative for weakness, light-headedness and headaches.  Hematological: Negative for adenopathy. Bruises/bleeds easily.  Psychiatric/Behavioral: Positive for dysphoric mood  (some days better than others). Negative for sleep disturbance (sleeping on 2 pillows). The patient is not nervous/anxious.        Objective:   Physical Exam  Constitutional: He is oriented to person, place, and time. He appears well-developed and well-nourished.  HENT:  Head: Normocephalic and atraumatic.  Eyes: Conjunctivae are normal. Pupils are equal, round, and reactive to light.  Neck: Normal range of motion. Neck supple.  Cardiovascular: Normal rate and regular rhythm.   Pulmonary/Chest: Effort normal. He has no wheezes. He has no rales.  Abdominal: Soft. He exhibits no distension. There is no tenderness.  Musculoskeletal: He exhibits edema (2+ pitting edema in bilateral lower legs) and tenderness (to palpation towards the knees).  Neurological: He is alert and oriented to person, place, and time.  Skin: Skin is warm and dry.  Psychiatric: He has a normal mood and affect. His behavior is normal. Thought content normal.  Nursing note and vitals reviewed.   BP 148/63 mmHg  Pulse 65  Resp 20  Ht  (1.778 m)  Wt 283 lb (128.368 kg)  BMI 40.61 kg/m2  SpO2 93%       Assessment & Plan:  1: Chronic heart failure with preserved ejection fraction- Patient presents with chronic fatigue and shortness of breath upon exertion. He does wear oxygen but only if he feels like he needs it. Doesn't wear his CPAP on a consistent basis. Continues to weigh himself and reports a stable home weight. By our scale, he's lost 7 pounds since August 2016. Reminded to call for an overnight weight gain of >2 pounds or a weekly weight gain of >5 pounds. He is not adding salt to his food but admits to eating soups on occasion as well as arby roast beef sandwiches (1 1/2 sandwiches) that he added cheese whiz to. Discussed the high sodium content of those foods and that it is going to make the swelling worse. Encouraged him to follow a  sodium diet consistently. Has 2+ pitting edema in bilateral lower  legs and he says that he does elevate them at home. Does have metolazone that he uses as needed. Discussed possibly changing his carvedilol to metoprolol to see if his dizziness would improve and he says that he'll think about it. Did receive his flu and pneumonia vaccine for this season.  2: Diabetes- He says that his glucose this morning was 62 and that he didn't take his bedtime insulin yesterday. Says that he's been having some morning lows. [facility] PharmD went in to review medications and encouraged him to speak with his endocrinologist about his multiple diabetes medications (glipizide, victoza, levimir and humalog). Does report some dizziness which could be related to his hypoglycemic episodes.  3: Fatigue- Patient says that he's "tired all the time". Doesn't wear his CPAP consistently. Discussed having his PCP check vitamin B & D levels to see if he's deficient. Also discussed changing beta-blocker per above. 4: Depression- Patient says that he continues to be depressed with some days being better than other. Has had suicidal thoughts but no plan and has no intent as he mentions having a granddaughter that he has to be around for. Fatigue could be related to his depression and he was encouraged to follow-up with his PCP about this.  Return in 3 months or sooner for any questions/problems before then.

## 2015-10-29 NOTE — Patient Instructions (Signed)
Continue weighing daily and call for an overnight weight gain of > 2 pounds or a weekly weight gain of >5 pounds. 

## 2015-10-30 ENCOUNTER — Other Ambulatory Visit: Payer: Self-pay | Admitting: *Deleted

## 2015-10-30 ENCOUNTER — Other Ambulatory Visit: Payer: Self-pay | Admitting: Family

## 2015-10-30 MED ORDER — METOCLOPRAMIDE HCL 5 MG PO TABS: 5.0000 mg | ORAL_TABLET | Freq: Every day | ORAL | Status: AC | PRN

## 2015-10-30 MED ORDER — METOPROLOL TARTRATE 25 MG PO TABS: 12.5000 mg | ORAL_TABLET | Freq: Two times a day (BID) | ORAL | Status: AC

## 2015-10-30 NOTE — Patient Outreach (Signed)
[facility]) Care Management  10/30/2015  Haynes HoehnMichael E Westrup August 07, 1956 [id number]   Phone call to patient to assess for continued social work involvement.  Per patient , he has received the information for the [facility], his wife will assist with completion of  this.  Patient has also received the Advanced Directive document.  Contact information for Pastoral Care at [facility] [phone].  Patient also received the food pantry resources mailed by the care Pharmacologistmanagement assistant.  Patient verbalized no further social work needs,  Case to be closed to social work.  Telephonic RNCM to be notified of case closure.    Adriana ReamsChrystal Dominic Mahaney, LCSW [facility] Care Management [phone]

## 2015-10-30 NOTE — Patient Outreach (Addendum)
[facility]) Care Management  10/30/2015  Carl Perry 05-Dec-1955 [id number]   Subjective:  Telephone call to patient.  HIPAA verified.   Patient states he is doing pretty good.   States he taking his Lasix as prescribed.  States he is having some swelling in legs but no shortness of breath.   States he is planning to follow [facility] today,  who has left him a message requesting call back. Patient states he went to see Clarisa Kindredina Hackney NP @ Congestive Heart Failure clinic on 10/29/15 for follow up appointment and states everything went well.   States he has not heard anything else regarding chest Xray results on 10/14/15 and feels no news is good news.  States he has finished the antibiotic treatment as prescribed.  States Dr. Carlynn PurlSowles told him during 10/14/15 office visit that he had walking pneumonia.  Patient states he feels much Perry and that everything has been taken care of.  Patient states he has received a packet of information [facility] Patient calendar, Med Alert brochure, Advanced Directive forms and possible the [facility] consent form)  from [facility] and will review with his wife.   States he is unable to read and his wife will review all the information and he will give RNCM an update next week on their next steps with packet information. Patient in agreement to continue to receive [facility] services.  RNCM advised patient of [facility] office closure times, closure dates and to follow up with 24 hour nurse line if needed.  Patient voice understanding and is in agreement of the plan if assistance is needed.   Assessment: [employer] HMO tier 4 list referral. Patient enrolled in [facility] Cong[facility] education, care coordination for depression screening follow up, and care coordination for scale purchase for congestive heart failure status monitoring. Patient completed antibiotic treatment.  Patient has a reliable scale and is monitoring weights daily.    Plan: RNCM will call patient to verify receipt and status of [facility] consent form completion within 1 week.  RNCM will follow up with patient to verify status of Med Alert community resource information within 1 week.  RNCM will follow up with patient to verify status of advanced directive forms within 1 week. RNCM will follow up with patient to obtain update on eye MD appointment within 2  weeks.   Jeret Goyer H. Gardiner Barefootooper RN, BSN, CCM [facility] Care Management [facility] Telephonic CM Phone: [phone]

## 2015-11-04 ENCOUNTER — Ambulatory Visit
Admission: RE | Admit: 2015-11-04 | Discharge: 2015-11-04 | Disposition: A | Discharge status: Home / Self Care | Payer: Commercial Managed Care - HMO | Source: Ambulatory Visit | Attending: Family Medicine | Admitting: Family Medicine

## 2015-11-04 ENCOUNTER — Encounter: Payer: Self-pay | Admitting: Family Medicine

## 2015-11-04 ENCOUNTER — Ambulatory Visit (INDEPENDENT_AMBULATORY_CARE_PROVIDER_SITE_OTHER): Payer: Commercial Managed Care - HMO | Admitting: Family Medicine

## 2015-11-04 VITALS — BP 130/72 | HR 69 | Temp 97.9°F | Resp 14 | Ht 70.0 in | Wt 271.6 lb

## 2015-11-04 DIAGNOSIS — J189 Pneumonia, unspecified organism: Secondary | ICD-10-CM

## 2015-11-04 DIAGNOSIS — I517 Cardiomegaly: Secondary | ICD-10-CM | POA: Insufficient documentation

## 2015-11-04 DIAGNOSIS — E785 Hyperlipidemia, unspecified: Secondary | ICD-10-CM

## 2015-11-04 DIAGNOSIS — R600 Localized edema: Secondary | ICD-10-CM

## 2015-11-04 NOTE — Progress Notes (Signed)
Name: Carl Perry   MRN: [id number]    DOB: [birth year]   Date:11/04/2015       Progress Note  Subjective  Chief Complaint  Chief Complaint  Patient presents with  . Follow-up    1 month F/U  . Pneumonia    CAP last visit started patient on Bactrim, has improved with antibiotic. Had blood work done at Dr. Wynelle Link office on 10/21/15 and will request records from them.     HPI  CAP : he was seen on 10/14/2015 with cough that started a week aftet URI, he had crackles on exam and CXR showed right basilar pneumonia. He was given Septra because we did not have results of his kidney function and is allergic to [facility]. He was having worsening of  SOB and yellow to brown sputum production, over the past few days productive cough resolved, still has a mild cough from COPD but now has a clear sputum occasionally.No chills, back to baseline fatigue. His appetite is also back to normal   Dyslipidemia: last labs reviewed. HDL very low at 32, triglycerides still elevated but improved at 190. He is taking gemfibrozile. Discussed ways to increase HDL by eating tree nuts and also fish twice weekly, he is unable to be physically active.    Patient Active Problem List   Diagnosis Date Noted  . Fatigue 10/29/2015  . Hepatic failure (HCC) 10/14/2015  . Hyperparathyroidism, secondary renal (HCC) 06/20/2015  . Proteinuria 06/20/2015  . Anemia of chronic disease 06/20/2015  . History of rhabdomyolysis 06/17/2015  . PVD (peripheral vascular disease) (HCC) 06/17/2015  . Ventral hernia 06/17/2015  . Hyperlipidemia 06/17/2015  . Status post amputation of left great toe (HCC) 06/17/2015  . COPD, moderate (HCC) 06/17/2015  . History of tobacco use 06/17/2015  . Major depression, recurrent, chronic (HCC) 06/17/2015  . OSA (obstructive sleep apnea) 04/30/2015  . Morbid obesity (HCC) 04/30/2015  . Chronic renal insufficiency, stage IV (severe) 04/30/2015  . Chronic diastolic heart failure (HCC) 02/12/2015  .  Type 2 diabetes mellitus with diabetic neuropathy (HCC) 04/16/2014    Past Surgical History  Procedure Laterality Date  . Toe amputation Right   . Foot surgery Right   . Tonsillectomy    . Abdominal hernia repair  1989  . Cardiac catheterization      [facility] no stents    Family History  Problem Relation Age of Onset  . Cancer Mother     Pancreatic  . Diabetes Sister   . Kidney disease Sister   . Diabetes Maternal Grandmother   . Heart disease Sister     Heart Failure  . Cancer Maternal Aunt   . Diabetes Sister   . Cancer Maternal Aunt   . Cancer Maternal Aunt     Social History   Social History  . Marital Status: Married    Spouse Name: N/A  . Number of Children: N/A  . Years of Education: N/A   Occupational History  . Not on file.   Social History Main Topics  . Smoking status: Former Smoker -- 2.00 packs/day for 35 years    Types: Cigarettes    Quit date: 11/08/2010  . Smokeless tobacco: Never Used  . Alcohol Use: No  . Drug Use: No  . Sexual Activity: Yes   Other Topics Concern  . Not on file   Social History Narrative     Current outpatient prescriptions:  .  acetaminophen-codeine (TYLENOL #3) 300-30 MG tablet, Take 1 tablet by mouth  every 8 (eight) hours as needed for moderate pain., Disp: 60 tablet, Rfl: 0 .  Alcohol Swabs (B-D SINGLE USE SWABS REGULAR) PADS, , Disp: , Rfl:  .  amitriptyline (ELAVIL) 25 MG tablet, Take 2 tablets by mouth at bedtime., Disp: , Rfl:  .  aspirin 81 MG tablet, Take 81 mg by mouth daily., Disp: , Rfl:  .  baclofen (LIORESAL) 20 MG tablet, TAKE 1 TABLET AT BEDTIME AS NEEDED  FOR  SPASMS, Disp: 90 tablet, Rfl: 1 .  Blood Glucose Calibration (TRUE METRIX LEVEL 1) LOW SOLN, , Disp: , Rfl:  .  carvedilol (COREG) 6.25 MG tablet, , Disp: , Rfl:  .  docusate sodium (COLACE) 100 MG capsule, Take 100 mg by mouth 2 (two) times daily as needed for mild constipation., Disp: , Rfl:  .  gemfibrozil (LOPID) 600 MG tablet, Take 1 tablet by  mouth 2 (two) times daily., Disp: , Rfl:  .  glipiZIDE (GLUCOTROL) 10 MG tablet, Take 1 tablet by mouth daily before breakfast. , Disp: , Rfl:  .  glucose blood test strip, , Disp: , Rfl:  .  insulin aspart (NOVOLOG) 100 UNIT/ML injection, Inject into the skin 3 (three) times daily before meals. As needed per sliding scale, Disp: , Rfl:  .  Insulin Detemir (LEVEMIR) 100 UNIT/ML Pen, Inject 45 Units into the skin daily at 10 pm. , Disp: , Rfl:  .  lactulose (CHRONULAC) 10 GM/15ML solution, Take 15 mLs by mouth 2 (two) times daily. , Disp: , Rfl:  .  Liraglutide 18 MG/3ML SOPN, Inject 1.6 mg into the skin daily., Disp: , Rfl:  .  metoCLOPramide (REGLAN) 5 MG tablet, Take 1 tablet (5 mg total) by mouth daily as needed for nausea., Disp: 90 tablet, Rfl: 3 .  metolazone (ZAROXOLYN) 5 MG tablet, Take 1 tablet by mouth daily as needed. , Disp: , Rfl:  .  metoprolol tartrate (LOPRESSOR) 25 MG tablet, Take 0.5 tablets (12.5 mg total) by mouth 2 (two) times daily., Disp: 90 tablet, Rfl: 3 .  potassium chloride SA (K-DUR,KLOR-CON) 20 MEQ tablet, Take 20 mEq by mouth 2 (two) times daily., Disp: , Rfl:  .  torsemide (DEMADEX) 100 MG tablet, Take 1 tablet (100 mg total) by mouth 2 (two) times daily., Disp: 180 tablet, Rfl: 1 .  TRUEPLUS LANCETS 28G MISC, , Disp: , Rfl:  .  venlafaxine XR (EFFEXOR-XR) 75 MG 24 hr capsule, Take 3 capsules (225 mg total) by mouth daily., Disp: 270 capsule, Rfl: 1  Allergies  Allergen Reactions  . Statins Swelling and Other (See Comments)    "Took strength out of legs"; rhabdomyolisis  . Ivp Dye [Iodinated Diagnostic Agents]   . Penicillins Rash     ROS  Ten systems reviewed and is negative except as mentioned in HPI   Objective  Filed Vitals:   11/04/15 1011  BP: 130/72  Pulse: 69  Temp: 97.9 F (36.6 C)  TempSrc: Oral  Resp: 14  Height:  (1.778 m)  Weight: 271 lb 9.6 oz (123.197 kg)  SpO2: 92%    Body mass index is 38.97 kg/(m^2).  Physical  Exam  Constitutional: Patient appears well-developed and well-nourished. Obese. Mild respiratory distress, when talking. HEENT: head atraumatic, normocephalic, pupils equal and reactive to light, neck supple, throat within normal limits Cardiovascular: Normal rate, regular rhythm and normal heart sounds.  No murmur heard. BLE edema - with some erythema  Pulmonary/Chest: Effort normal and breath sounds normal. No respiratory distress. Abdominal:  Soft.  There is no tenderness. Psychiatric: Patient has a normal mood and affect. behavior is normal. Judgment and thought content normal.  PHQ2/9: Depression screen [facility] 2/9 10/29/2015 10/10/2015 07/09/2015 06/17/2015 04/08/2015  Decreased Interest 0 1 0 0 1  Down, Depressed, Hopeless 0 3 3 0 3  PHQ - 2 Score 0 4 3 0 4  Altered sleeping - 3 1 - 1  Tired, decreased energy - 3 3 - 0  Change in appetite - 1 0 - 1  Feeling bad or failure about yourself  - 3 3 - 1  Trouble concentrating - 0 2 - 0  Moving slowly or fidgety/restless - 3 1 - 0  Suicidal thoughts - 1 1 - 0  PHQ-9 Score - 18 14 - 7  Difficult doing work/chores - Not difficult at all Not difficult at all - Not difficult at all     Fall Risk: Fall Risk  10/29/2015 10/10/2015 07/09/2015 06/17/2015 04/08/2015  Falls in the past year? Yes Yes Yes Yes Yes  Number falls in past yr: 2 or more 2 or more 2 or more 1 2 or more  Injury with Fall? No No No No No  Risk Factor Category  - - High Fall Risk - High Fall Risk  Risk for fall due to : - History of fall(s) - - History of fall(s);Impaired balance/gait  Follow up Falls prevention discussed - Falls prevention discussed - Falls prevention discussed     Assessment & Plan  1. CAP (community acquired pneumonia)  - DG Chest 2 View; Future  2. Dyslipidemia  Lipid panel shows low HDL : to improve HDL patient  needs to eat tree nuts ( pecans/pistachios/almonds ) four times weekly, eat fish two times weekly  and exercise  at least 150 minutes per  week   3. Bilateral lower extremity edema  He denies symptoms of orthopnea, PND, he states he ate more salt this weekend because of holidays, and he was cooking and was standing up for hours. Advised to raise his leg and take extra lasix, return if no improvement

## 2015-11-05 ENCOUNTER — Telehealth: Payer: Self-pay | Admitting: Family

## 2015-11-05 ENCOUNTER — Encounter: Payer: Self-pay | Admitting: *Deleted

## 2015-11-05 ENCOUNTER — Other Ambulatory Visit: Payer: Self-pay | Admitting: Family

## 2015-11-05 ENCOUNTER — Ambulatory Visit: Payer: Commercial Managed Care - HMO | Admitting: Cardiovascular Disease

## 2015-11-05 ENCOUNTER — Other Ambulatory Visit: Payer: Self-pay | Admitting: *Deleted

## 2015-11-05 NOTE — Telephone Encounter (Signed)
[facility] who wanted to confirm the discontinuation of carvedilol since we were starting metoprolol. That was confirmed and patient was informed.

## 2015-11-05 NOTE — Patient Outreach (Addendum)
[facility]) Care Management  11/05/2015  Carl HoehnMichael E Perry [birth year] [id number]   Subjective: Telephone call to patient.   HIPAA verified.   Patient states he is doing well today and had a great Christmas.   Patient states he is a little hoarse today due to the weather changes and feels fine.   States he went to see Dr. Carlynn PurlSowles on 11/04/15 for a follow up visit and things went well.   States he has continued to discuss his depression and lack of energy with MD.   States he is planning to follow up on MD's suggestions to take Vitamin B12 to increase energy level,  with renal MD's clearance and making sure it will not worsen his kidney function.  Patient also states [facility] will [facility] his behavioral health benefits for outpatient counseling.   RNCM advised patient to continue to express his feelings regarding his depression and advocate as needed to meet his goals.   Patient voices understanding and states he will continue to speak up and follow up on the resources.  Patient states he is continuing to take depression medication as prescribed and does not want to take an additional medicines since he is already taking so many.   Patient states he is currently out of Lasix, MD's office and [employer] are aware and have put a plan in place to meet his medication needs temporarily until the Lasix is received.   Patient states his eye MD appointment is scheduled for 11/25/15 to follow up on the broken blood vessels in his eyes.   States he and his wife have [facility]   Patient states he planning to complete his Advanced Directive forms and will send RNCM a copy once completed.  Patient verbalizes understanding that the witness on the Advanced Directive forms, can not be a relative.   States he has some friends that he can ask to witness the forms for him.   Patient states he received the Med Alert brochure and is planning to call to verify  a mobile device is available.   States if mobile Med Alert device available, he will pursue obtaining the services.   Patient states he has no other care coordination needs at this time and will follow up on all the community resources provided.  Patient states [facility] on 11/04/15 as prompted through the automated reminder, did not have any care management needs and was only responding to the reminder call regarding today's scheduled telephonic appointment with [facility]  Patient states he will fill out the [facility] consent and [facility]. Patient gave verbal consent to continue to receive [facility] services today and in the future if needed.   Patient states he does not need [facility] Health Coach services at this time and will contact RNCM in the future if services needed.   Patient verbally given RNCM's, [facility] and [facility] Nurse Advice line phone numbers to contact in the future if needed.    Assessment: [employer] HMO tier 4 list referral. Patient enrolled in [facility] Congestive [facility] education, care coordination for depression screening follow up, and care coordination for scale purchase for congestive heart failure status monitoring. Patient completed antibiotic treatment. Patient has a reliable scale and is monitoring weights daily. Patient has no other care coordination needs at this time and patient will continue follow up on all goals.  Patient is planning to follow up  on all the community resources RNCM provided.  Patient provided resources for depression and is planning to follow up on all depression resources.  Patient is able to self manage congestive heart failure, follow up with providers as needed for condition changes, and follow up on all community resources.   Plan: RNCM will send case closure letter to primary MD.  [employer] will send case closure letter to patient.  RNCM will send request for case closure to Damita Rhodie.    Epifania Littrell H. Gardiner Barefoot, BSN,  CCM [facility] Care Management [facility] Telephonic CM Phone: [phone]

## 2015-11-05 NOTE — Telephone Encounter (Signed)
Spoke with patient about the message he had left on our phone. He says that [facility] him saying that they needed more information about a prescription that was sent in. I informed patient that we hadn't received any faxes nor voicemail messages from [facility] but that we would call [employer] to see if we can find out what further information they need. He appreciated the call back.

## 2015-11-06 ENCOUNTER — Telehealth: Payer: Self-pay

## 2015-11-06 NOTE — Telephone Encounter (Signed)
[facility] and states patient is on Carvedilol 6.25 ng and Metoprolol Tart 25 mg and wanting clarification on which therapy you want patient on? Carvedilol or Metoprolol Tart?

## 2015-11-06 NOTE — Telephone Encounter (Signed)
Carl Freezeina A Hackney, FNP at 11/05/2015 12:10 PM     Status: Signed       Expand All Collapse All   [facility] with [facility] who wanted to confirm the discontinuation of carvedilol since we were starting metoprolol. That was confirmed and patient was informed.

## 2015-11-12 ENCOUNTER — Inpatient Hospital Stay
Admission: AD | Admit: 2015-11-12 | Discharge: 2015-12-10 | DRG: 870 | Disposition: E | Payer: Commercial Managed Care - HMO | Attending: Internal Medicine | Admitting: Internal Medicine

## 2015-11-12 ENCOUNTER — Emergency Department: Payer: Commercial Managed Care - HMO

## 2015-11-12 DIAGNOSIS — N170 Acute kidney failure with tubular necrosis: Secondary | ICD-10-CM | POA: Diagnosis present

## 2015-11-12 DIAGNOSIS — J441 Chronic obstructive pulmonary disease with (acute) exacerbation: Secondary | ICD-10-CM | POA: Diagnosis present

## 2015-11-12 DIAGNOSIS — Z515 Encounter for palliative care: Secondary | ICD-10-CM | POA: Diagnosis not present

## 2015-11-12 DIAGNOSIS — Z833 Family history of diabetes mellitus: Secondary | ICD-10-CM

## 2015-11-12 DIAGNOSIS — R34 Anuria and oliguria: Secondary | ICD-10-CM | POA: Diagnosis present

## 2015-11-12 DIAGNOSIS — Z6841 Body Mass Index (BMI) 40.0 and over, adult: Secondary | ICD-10-CM

## 2015-11-12 DIAGNOSIS — J181 Lobar pneumonia, unspecified organism: Secondary | ICD-10-CM

## 2015-11-12 DIAGNOSIS — K8020 Calculus of gallbladder without cholecystitis without obstruction: Secondary | ICD-10-CM | POA: Diagnosis present

## 2015-11-12 DIAGNOSIS — Z8249 Family history of ischemic heart disease and other diseases of the circulatory system: Secondary | ICD-10-CM

## 2015-11-12 DIAGNOSIS — E1140 Type 2 diabetes mellitus with diabetic neuropathy, unspecified: Secondary | ICD-10-CM | POA: Diagnosis present

## 2015-11-12 DIAGNOSIS — E874 Mixed disorder of acid-base balance: Secondary | ICD-10-CM | POA: Diagnosis present

## 2015-11-12 DIAGNOSIS — N179 Acute kidney failure, unspecified: Secondary | ICD-10-CM | POA: Diagnosis not present

## 2015-11-12 DIAGNOSIS — I4581 Long QT syndrome: Secondary | ICD-10-CM | POA: Diagnosis present

## 2015-11-12 DIAGNOSIS — E876 Hypokalemia: Secondary | ICD-10-CM | POA: Diagnosis present

## 2015-11-12 DIAGNOSIS — J189 Pneumonia, unspecified organism: Secondary | ICD-10-CM | POA: Diagnosis not present

## 2015-11-12 DIAGNOSIS — J9601 Acute respiratory failure with hypoxia: Secondary | ICD-10-CM | POA: Diagnosis not present

## 2015-11-12 DIAGNOSIS — Z66 Do not resuscitate: Secondary | ICD-10-CM | POA: Diagnosis not present

## 2015-11-12 DIAGNOSIS — Z880 Allergy status to penicillin: Secondary | ICD-10-CM

## 2015-11-12 DIAGNOSIS — N049 Nephrotic syndrome with unspecified morphologic changes: Secondary | ICD-10-CM | POA: Diagnosis present

## 2015-11-12 DIAGNOSIS — I509 Heart failure, unspecified: Secondary | ICD-10-CM

## 2015-11-12 DIAGNOSIS — Y929 Unspecified place or not applicable: Secondary | ICD-10-CM

## 2015-11-12 DIAGNOSIS — R0600 Dyspnea, unspecified: Secondary | ICD-10-CM

## 2015-11-12 DIAGNOSIS — D631 Anemia in chronic kidney disease: Secondary | ICD-10-CM | POA: Diagnosis present

## 2015-11-12 DIAGNOSIS — Z89411 Acquired absence of right great toe: Secondary | ICD-10-CM | POA: Diagnosis not present

## 2015-11-12 DIAGNOSIS — J9622 Acute and chronic respiratory failure with hypercapnia: Secondary | ICD-10-CM | POA: Diagnosis present

## 2015-11-12 DIAGNOSIS — I472 Ventricular tachycardia: Secondary | ICD-10-CM | POA: Diagnosis present

## 2015-11-12 DIAGNOSIS — R339 Retention of urine, unspecified: Secondary | ICD-10-CM | POA: Diagnosis not present

## 2015-11-12 DIAGNOSIS — J810 Acute pulmonary edema: Secondary | ICD-10-CM

## 2015-11-12 DIAGNOSIS — Z9911 Dependence on respirator [ventilator] status: Secondary | ICD-10-CM | POA: Diagnosis not present

## 2015-11-12 DIAGNOSIS — I214 Non-ST elevation (NSTEMI) myocardial infarction: Secondary | ICD-10-CM | POA: Diagnosis present

## 2015-11-12 DIAGNOSIS — L8990 Pressure ulcer of unspecified site, unspecified stage: Secondary | ICD-10-CM | POA: Diagnosis present

## 2015-11-12 DIAGNOSIS — I5033 Acute on chronic diastolic (congestive) heart failure: Secondary | ICD-10-CM | POA: Diagnosis present

## 2015-11-12 DIAGNOSIS — R451 Restlessness and agitation: Secondary | ICD-10-CM | POA: Diagnosis present

## 2015-11-12 DIAGNOSIS — Z466 Encounter for fitting and adjustment of urinary device: Secondary | ICD-10-CM | POA: Diagnosis not present

## 2015-11-12 DIAGNOSIS — J9621 Acute and chronic respiratory failure with hypoxia: Secondary | ICD-10-CM | POA: Diagnosis present

## 2015-11-12 DIAGNOSIS — Z79899 Other long term (current) drug therapy: Secondary | ICD-10-CM | POA: Diagnosis not present

## 2015-11-12 DIAGNOSIS — N358 Other urethral stricture: Secondary | ICD-10-CM | POA: Diagnosis not present

## 2015-11-12 DIAGNOSIS — I5032 Chronic diastolic (congestive) heart failure: Secondary | ICD-10-CM

## 2015-11-12 DIAGNOSIS — R338 Other retention of urine: Secondary | ICD-10-CM | POA: Diagnosis not present

## 2015-11-12 DIAGNOSIS — Z91041 Radiographic dye allergy status: Secondary | ICD-10-CM

## 2015-11-12 DIAGNOSIS — K7210 Chronic hepatic failure without coma: Secondary | ICD-10-CM | POA: Diagnosis present

## 2015-11-12 DIAGNOSIS — Z7982 Long term (current) use of aspirin: Secondary | ICD-10-CM

## 2015-11-12 DIAGNOSIS — I130 Hypertensive heart and chronic kidney disease with heart failure and stage 1 through stage 4 chronic kidney disease, or unspecified chronic kidney disease: Secondary | ICD-10-CM | POA: Diagnosis present

## 2015-11-12 DIAGNOSIS — I2510 Atherosclerotic heart disease of native coronary artery without angina pectoris: Secondary | ICD-10-CM | POA: Diagnosis present

## 2015-11-12 DIAGNOSIS — G4733 Obstructive sleep apnea (adult) (pediatric): Secondary | ICD-10-CM | POA: Diagnosis present

## 2015-11-12 DIAGNOSIS — Z87891 Personal history of nicotine dependence: Secondary | ICD-10-CM | POA: Diagnosis not present

## 2015-11-12 DIAGNOSIS — J13 Pneumonia due to Streptococcus pneumoniae: Secondary | ICD-10-CM | POA: Diagnosis present

## 2015-11-12 DIAGNOSIS — Z4659 Encounter for fitting and adjustment of other gastrointestinal appliance and device: Secondary | ICD-10-CM

## 2015-11-12 DIAGNOSIS — E11319 Type 2 diabetes mellitus with unspecified diabetic retinopathy without macular edema: Secondary | ICD-10-CM | POA: Diagnosis present

## 2015-11-12 DIAGNOSIS — E1121 Type 2 diabetes mellitus with diabetic nephropathy: Secondary | ICD-10-CM | POA: Diagnosis present

## 2015-11-12 DIAGNOSIS — J449 Chronic obstructive pulmonary disease, unspecified: Secondary | ICD-10-CM

## 2015-11-12 DIAGNOSIS — A403 Sepsis due to Streptococcus pneumoniae: Principal | ICD-10-CM | POA: Diagnosis present

## 2015-11-12 DIAGNOSIS — T39015A Adverse effect of aspirin, initial encounter: Secondary | ICD-10-CM | POA: Diagnosis not present

## 2015-11-12 DIAGNOSIS — E875 Hyperkalemia: Secondary | ICD-10-CM | POA: Diagnosis not present

## 2015-11-12 DIAGNOSIS — E11649 Type 2 diabetes mellitus with hypoglycemia without coma: Secondary | ICD-10-CM | POA: Diagnosis present

## 2015-11-12 DIAGNOSIS — Z888 Allergy status to other drugs, medicaments and biological substances status: Secondary | ICD-10-CM

## 2015-11-12 DIAGNOSIS — K922 Gastrointestinal hemorrhage, unspecified: Secondary | ICD-10-CM | POA: Diagnosis present

## 2015-11-12 DIAGNOSIS — F329 Major depressive disorder, single episode, unspecified: Secondary | ICD-10-CM | POA: Diagnosis present

## 2015-11-12 DIAGNOSIS — N184 Chronic kidney disease, stage 4 (severe): Secondary | ICD-10-CM | POA: Diagnosis present

## 2015-11-12 DIAGNOSIS — N2581 Secondary hyperparathyroidism of renal origin: Secondary | ICD-10-CM | POA: Diagnosis present

## 2015-11-12 DIAGNOSIS — E785 Hyperlipidemia, unspecified: Secondary | ICD-10-CM | POA: Diagnosis present

## 2015-11-12 DIAGNOSIS — J440 Chronic obstructive pulmonary disease with acute lower respiratory infection: Secondary | ICD-10-CM | POA: Diagnosis present

## 2015-11-12 DIAGNOSIS — Z808 Family history of malignant neoplasm of other organs or systems: Secondary | ICD-10-CM | POA: Diagnosis not present

## 2015-11-12 DIAGNOSIS — J9690 Respiratory failure, unspecified, unspecified whether with hypoxia or hypercapnia: Secondary | ICD-10-CM | POA: Insufficient documentation

## 2015-11-12 DIAGNOSIS — E871 Hypo-osmolality and hyponatremia: Secondary | ICD-10-CM | POA: Diagnosis present

## 2015-11-12 DIAGNOSIS — T45515A Adverse effect of anticoagulants, initial encounter: Secondary | ICD-10-CM | POA: Diagnosis not present

## 2015-11-12 DIAGNOSIS — J154 Pneumonia due to other streptococci: Secondary | ICD-10-CM | POA: Diagnosis not present

## 2015-11-12 DIAGNOSIS — E1122 Type 2 diabetes mellitus with diabetic chronic kidney disease: Secondary | ICD-10-CM | POA: Diagnosis present

## 2015-11-12 DIAGNOSIS — R6521 Severe sepsis with septic shock: Secondary | ICD-10-CM | POA: Diagnosis present

## 2015-11-12 DIAGNOSIS — Z794 Long term (current) use of insulin: Secondary | ICD-10-CM

## 2015-11-12 DIAGNOSIS — Z452 Encounter for adjustment and management of vascular access device: Secondary | ICD-10-CM

## 2015-11-12 DIAGNOSIS — R0602 Shortness of breath: Secondary | ICD-10-CM | POA: Diagnosis present

## 2015-11-12 HISTORY — DX: Anemia in other chronic diseases classified elsewhere: D63.8

## 2015-11-12 HISTORY — DX: Other psychoactive substance abuse, uncomplicated: F19.10

## 2015-11-12 HISTORY — DX: Type 2 diabetes mellitus without complications: E11.9

## 2015-11-12 HISTORY — DX: Chronic diastolic (congestive) heart failure: I50.32

## 2015-11-12 HISTORY — DX: Pneumonia, unspecified organism: J18.9

## 2015-11-12 HISTORY — DX: Chronic hepatic failure without coma: K72.10

## 2015-11-12 HISTORY — DX: Chronic kidney disease, stage 4 (severe): N18.4

## 2015-11-12 LAB — BLOOD GAS, ARTERIAL
ACID-BASE DEFICIT: 2.5 mmol/L — AB (ref 0.0–2.0)
BICARBONATE: 25.4 meq/L (ref 21.0–28.0)
EXPIRATORY PAP: 12
FIO2: 1
Inspiratory PAP: 24
Mechanical Rate: 12
O2 SAT: 89.9 %
PATIENT TEMPERATURE: 37
PH ART: 7.25 — AB (ref 7.350–7.450)
PO2 ART: 68 mmHg — AB (ref 83.0–108.0)
RATE: 39 resp/min
pCO2 arterial: 58 mmHg — ABNORMAL HIGH (ref 32.0–48.0)

## 2015-11-12 LAB — CBC WITH DIFFERENTIAL/PLATELET
BASOS ABS: 0 10*3/uL (ref 0–0.1)
BASOS PCT: 0 %
EOS ABS: 0 10*3/uL (ref 0–0.7)
EOS PCT: 0 %
HCT: 34 % — ABNORMAL LOW (ref 40.0–52.0)
HEMOGLOBIN: 10.6 g/dL — AB (ref 13.0–18.0)
Lymphocytes Relative: 8 %
Lymphs Abs: 2.2 10*3/uL (ref 1.0–3.6)
MCH: 24.1 pg — AB (ref 26.0–34.0)
MCHC: 31.1 g/dL — ABNORMAL LOW (ref 32.0–36.0)
MCV: 77.4 fL — ABNORMAL LOW (ref 80.0–100.0)
Monocytes Absolute: 1 10*3/uL (ref 0.2–1.0)
Monocytes Relative: 4 %
NEUTROS PCT: 88 %
Neutro Abs: 22.8 10*3/uL — ABNORMAL HIGH (ref 1.4–6.5)
PLATELETS: 488 10*3/uL — AB (ref 150–440)
RBC: 4.39 MIL/uL — AB (ref 4.40–5.90)
RDW: 20.1 % — ABNORMAL HIGH (ref 11.5–14.5)
WBC: 26.1 10*3/uL — AB (ref 3.8–10.6)

## 2015-11-12 LAB — COMPREHENSIVE METABOLIC PANEL
ALBUMIN: 2.6 g/dL — AB (ref 3.5–5.0)
ALK PHOS: 73 U/L (ref 38–126)
ALT: 11 U/L — AB (ref 17–63)
ANION GAP: 16 — AB (ref 5–15)
AST: 18 U/L (ref 15–41)
BUN: 69 mg/dL — ABNORMAL HIGH (ref 6–20)
CALCIUM: 9.1 mg/dL (ref 8.9–10.3)
CHLORIDE: 100 mmol/L — AB (ref 101–111)
CO2: 22 mmol/L (ref 22–32)
CREATININE: 2.95 mg/dL — AB (ref 0.61–1.24)
GFR calc non Af Amer: 22 mL/min — ABNORMAL LOW (ref >60)
GFR, EST AFRICAN AMERICAN: 25 mL/min — AB (ref >60)
GLUCOSE: 190 mg/dL — AB (ref 65–99)
Potassium: 3.2 mmol/L — ABNORMAL LOW (ref 3.5–5.1)
SODIUM: 138 mmol/L (ref 135–145)
Total Bilirubin: 0.6 mg/dL (ref 0.3–1.2)
Total Protein: 8.9 g/dL — ABNORMAL HIGH (ref 6.5–8.1)

## 2015-11-12 LAB — TROPONIN I: Troponin I: 1.01 ng/mL — ABNORMAL HIGH (ref <0.031)

## 2015-11-12 LAB — LACTIC ACID, PLASMA
LACTIC ACID, VENOUS: 2.9 mmol/L — AB (ref 0.5–2.0)
Lactic Acid, Venous: 1.8 mmol/L (ref 0.5–2.0)

## 2015-11-12 LAB — GLUCOSE, CAPILLARY: Glucose-Capillary: 124 mg/dL — ABNORMAL HIGH (ref 65–99)

## 2015-11-12 LAB — MAGNESIUM: Magnesium: 2.1 mg/dL (ref 1.7–2.4)

## 2015-11-12 LAB — BRAIN NATRIURETIC PEPTIDE: B NATRIURETIC PEPTIDE 5: 834 pg/mL — AB (ref 0.0–100.0)

## 2015-11-12 MED ORDER — CHLORHEXIDINE GLUCONATE 0.12 % MT SOLN
15.0000 mL | Freq: Two times a day (BID) | OROMUCOSAL | Status: DC
  Administered 2015-11-12 – 2015-11-21 (×16): 15 mL via OROMUCOSAL
  Filled 2015-11-12 (×6): qty 15

## 2015-11-12 MED ORDER — AMITRIPTYLINE HCL 50 MG PO TABS
50.0000 mg | ORAL_TABLET | Freq: Every day | ORAL | Status: DC
  Administered 2015-11-13 – 2015-11-18 (×6): 50 mg via ORAL
  Filled 2015-11-12 (×6): qty 1

## 2015-11-12 MED ORDER — FUROSEMIDE 40 MG PO TABS: 80.0000 mg | ORAL_TABLET | Freq: Two times a day (BID) | ORAL | Status: DC

## 2015-11-12 MED ORDER — ONDANSETRON HCL 4 MG PO TABS: 4.0000 mg | ORAL_TABLET | Freq: Four times a day (QID) | ORAL | Status: DC | PRN

## 2015-11-12 MED ORDER — INSULIN DETEMIR 100 UNIT/ML ~~LOC~~ SOLN
45.0000 [IU] | Freq: Every day | SUBCUTANEOUS | Status: DC
  Filled 2015-11-12 (×2): qty 0.45

## 2015-11-12 MED ORDER — ACETAMINOPHEN-CODEINE #3 300-30 MG PO TABS
1.0000 | ORAL_TABLET | Freq: Three times a day (TID) | ORAL | Status: DC | PRN
  Administered 2015-11-13 – 2015-11-15 (×4): 1 via ORAL
  Filled 2015-11-12 (×4): qty 1

## 2015-11-12 MED ORDER — METOLAZONE 2.5 MG PO TABS: 5.0000 mg | ORAL_TABLET | Freq: Every day | ORAL | Status: DC | PRN

## 2015-11-12 MED ORDER — VENLAFAXINE HCL ER 75 MG PO CP24
225.0000 mg | ORAL_CAPSULE | Freq: Every day | ORAL | Status: DC
  Administered 2015-11-14 – 2015-11-18 (×5): 225 mg via ORAL
  Filled 2015-11-12 (×5): qty 3

## 2015-11-12 MED ORDER — SODIUM CHLORIDE 0.9 % IV SOLN: 250.0000 mL | INTRAVENOUS | Status: DC | PRN

## 2015-11-12 MED ORDER — ACETAMINOPHEN 325 MG PO TABS
650.0000 mg | ORAL_TABLET | Freq: Four times a day (QID) | ORAL | Status: DC | PRN
  Filled 2015-11-12: qty 2

## 2015-11-12 MED ORDER — ZIPRASIDONE MESYLATE 20 MG IM SOLR
5.0000 mg | Freq: Once | INTRAMUSCULAR | Status: AC
  Administered 2015-11-12: 5 mg via INTRAMUSCULAR
  Filled 2015-11-12: qty 20

## 2015-11-12 MED ORDER — ACETAMINOPHEN 650 MG RE SUPP: 650.0000 mg | Freq: Four times a day (QID) | RECTAL | Status: DC | PRN

## 2015-11-12 MED ORDER — LEVOFLOXACIN 750 MG PO TABS: 750.0000 mg | ORAL_TABLET | ORAL | Status: DC

## 2015-11-12 MED ORDER — GABAPENTIN 300 MG PO CAPS
600.0000 mg | ORAL_CAPSULE | Freq: Every day | ORAL | Status: DC
  Administered 2015-11-13 – 2015-11-18 (×6): 600 mg via ORAL
  Filled 2015-11-12 (×6): qty 2

## 2015-11-12 MED ORDER — LACTULOSE 10 GM/15ML PO SOLN
10.0000 g | Freq: Two times a day (BID) | ORAL | Status: DC
  Administered 2015-11-13 – 2015-11-20 (×13): 10 g via ORAL
  Filled 2015-11-12 (×14): qty 30

## 2015-11-12 MED ORDER — ASPIRIN 81 MG PO CHEW
324.0000 mg | CHEWABLE_TABLET | Freq: Once | ORAL | Status: AC
  Administered 2015-11-12: 324 mg via ORAL
  Filled 2015-11-12: qty 4

## 2015-11-12 MED ORDER — HEPARIN SODIUM (PORCINE) 5000 UNIT/ML IJ SOLN
5000.0000 [IU] | Freq: Three times a day (TID) | INTRAMUSCULAR | Status: DC
  Administered 2015-11-13: 5000 [IU] via SUBCUTANEOUS
  Filled 2015-11-12: qty 1

## 2015-11-12 MED ORDER — METOCLOPRAMIDE HCL 10 MG PO TABS: 5.0000 mg | ORAL_TABLET | Freq: Every day | ORAL | Status: DC | PRN

## 2015-11-12 MED ORDER — INSULIN ASPART 100 UNIT/ML ~~LOC~~ SOLN: 3.0000 [IU] | Freq: Three times a day (TID) | SUBCUTANEOUS | Status: DC

## 2015-11-12 MED ORDER — FUROSEMIDE 10 MG/ML IJ SOLN
40.0000 mg | Freq: Once | INTRAMUSCULAR | Status: AC
Start: 2015-11-12 — End: 2015-11-12
  Administered 2015-11-12: 40 mg via INTRAVENOUS
  Filled 2015-11-12: qty 4

## 2015-11-12 MED ORDER — BACLOFEN 10 MG PO TABS: 20.0000 mg | ORAL_TABLET | Freq: Every evening | ORAL | Status: DC | PRN

## 2015-11-12 MED ORDER — FUROSEMIDE 10 MG/ML IJ SOLN: 10.0000 mg/h | INTRAVENOUS | Status: DC

## 2015-11-12 MED ORDER — INSULIN ASPART 100 UNIT/ML ~~LOC~~ SOLN: 0.0000 [IU] | Freq: Three times a day (TID) | SUBCUTANEOUS | Status: DC

## 2015-11-12 MED ORDER — POTASSIUM CHLORIDE CRYS ER 20 MEQ PO TBCR
40.0000 meq | EXTENDED_RELEASE_TABLET | Freq: Every day | ORAL | Status: DC
  Administered 2015-11-12: 40 meq via ORAL
  Filled 2015-11-12: qty 2

## 2015-11-12 MED ORDER — ALBUTEROL SULFATE (2.5 MG/3ML) 0.083% IN NEBU
2.5000 mg | INHALATION_SOLUTION | RESPIRATORY_TRACT | Status: DC
  Administered 2015-11-12 – 2015-11-13 (×3): 2.5 mg via RESPIRATORY_TRACT
  Filled 2015-11-12 (×3): qty 3

## 2015-11-12 MED ORDER — GEMFIBROZIL 600 MG PO TABS
600.0000 mg | ORAL_TABLET | Freq: Two times a day (BID) | ORAL | Status: DC
  Administered 2015-11-13 – 2015-11-20 (×14): 600 mg via ORAL
  Filled 2015-11-12 (×14): qty 1

## 2015-11-12 MED ORDER — DEXTROSE 5 % IV SOLN
2.0000 g | Freq: Three times a day (TID) | INTRAVENOUS | Status: DC
  Administered 2015-11-13 (×2): 2 g via INTRAVENOUS
  Filled 2015-11-12 (×4): qty 2

## 2015-11-12 MED ORDER — SODIUM CHLORIDE 0.9 % IJ SOLN
3.0000 mL | Freq: Two times a day (BID) | INTRAMUSCULAR | Status: DC
  Administered 2015-11-12 – 2015-11-20 (×16): 3 mL via INTRAVENOUS

## 2015-11-12 MED ORDER — DOCUSATE SODIUM 100 MG PO CAPS: 100.0000 mg | ORAL_CAPSULE | Freq: Two times a day (BID) | ORAL | Status: DC | PRN

## 2015-11-12 MED ORDER — SODIUM CHLORIDE 0.9 % IJ SOLN: 3.0000 mL | INTRAMUSCULAR | Status: DC | PRN

## 2015-11-12 MED ORDER — ONDANSETRON HCL 4 MG/2ML IJ SOLN
4.0000 mg | Freq: Four times a day (QID) | INTRAMUSCULAR | Status: DC | PRN
  Administered 2015-11-14 – 2015-11-16 (×4): 4 mg via INTRAVENOUS
  Filled 2015-11-12 (×4): qty 2

## 2015-11-12 MED ORDER — SODIUM CHLORIDE 0.9 % IJ SOLN
3.0000 mL | Freq: Two times a day (BID) | INTRAMUSCULAR | Status: DC
  Administered 2015-11-12 – 2015-11-20 (×13): 3 mL via INTRAVENOUS

## 2015-11-12 MED ORDER — FUROSEMIDE 10 MG/ML IJ SOLN: 200.0000 mg | Freq: Once | INTRAVENOUS | Status: DC

## 2015-11-12 MED ORDER — NOREPINEPHRINE BITARTRATE 1 MG/ML IV SOLN
0.0000 ug/min | INTRAVENOUS | Status: DC
  Filled 2015-11-12: qty 4

## 2015-11-12 MED ORDER — CETYLPYRIDINIUM CHLORIDE 0.05 % MT LIQD
7.0000 mL | Freq: Two times a day (BID) | OROMUCOSAL | Status: DC
  Administered 2015-11-13 – 2015-11-21 (×17): 7 mL via OROMUCOSAL

## 2015-11-12 MED ORDER — ASPIRIN EC 81 MG PO TBEC
81.0000 mg | DELAYED_RELEASE_TABLET | Freq: Every day | ORAL | Status: DC
  Administered 2015-11-14 – 2015-11-16 (×3): 81 mg via ORAL
  Filled 2015-11-12 (×3): qty 1

## 2015-11-12 MED ORDER — LEVOFLOXACIN IN D5W 750 MG/150ML IV SOLN
750.0000 mg | Freq: Once | INTRAVENOUS | Status: AC
  Administered 2015-11-12: 750 mg via INTRAVENOUS
  Filled 2015-11-12: qty 150

## 2015-11-12 NOTE — ED Notes (Signed)
Pt from home via EMS, difficulty breathing for past few days, 85% on bipap per EMS.

## 2015-11-12 NOTE — H&P (Addendum)
[facility] [place] [facility]   PATIENT NAME: Carl SheerMichael Perry    MR#:  [id number]  DATE OF BIRTH:  10-Feb-1956  DATE OF ADMISSION:  11/28/2015  PRIMARY CARE PHYSICIAN: Ruel FavorsKrichna F Sowles, MD   REQUESTING/REFERRING PHYSICIAN:   CHIEF COMPLAINT:   Chief Complaint  Patient presents with  . Respiratory Distress    HISTORY OF PRESENT ILLNESS: Carl Perry  is a 60 y.o. male with a known history of diabetes with neuropathy, COPD, stage IV, peripheral vascular disease, liver failure, secondary hyperparathyroidism, hyperlipidemia, COPD who presents to the hospital with complaints of worsening shortness of breath over the past 3 or 4 days, some wheezing, minimal cough and sputum production. On arrival to emergency room, patient was noted to have tachycardia and tachypnea and was hypoxic to 86% on BiPAP. He initially refused intubation. Despite his ABGs showing a pH of 7.25, but then later agreed due to that. He improved. However, on the BiPAP. His labs revealed renal failure, chronic and elevated troponin to 1.01. Patient's lactic acid level was found to be elevated at 2.9 and his white blood cell count was 26.1. Chest x-ray revealed worsening right-sided pneumonia. Hospitalist services were contacted for admission. No review of system is possible that the patient agitated and screaming that he wants to get off the bed due to pain in the legs.   PAST MEDICAL HISTORY:   Past Medical History  Diagnosis Date  . COPD (chronic obstructive pulmonary disease) (HCC)   . Diabetes mellitus without complication (HCC)   . Hypertension   . CKD (chronic kidney disease)   . Sleep apnea   . Rhabdomyolysis   . Hyperlipidemia   . Morbid obesity (HCC)   . Depression   . CHF (congestive heart failure) (HCC)   . Allergy     PAST SURGICAL HISTORY: Past Surgical History  Procedure Laterality Date  . Toe amputation Right   . Foot surgery Right   . Tonsillectomy    . Abdominal  hernia repair  1989  . Cardiac catheterization      [facility] no stents    SOCIAL HISTORY:  Social History  Substance Use Topics  . Smoking status: Former Smoker -- 2.00 packs/day for 35 years    Types: Cigarettes    Quit date: 11/08/2010  . Smokeless tobacco: Never Used  . Alcohol Use: No    FAMILY HISTORY:  Family History  Problem Relation Age of Onset  . Cancer Mother     Pancreatic  . Diabetes Sister   . Kidney disease Sister   . Diabetes Maternal Grandmother   . Heart disease Sister     Heart Failure  . Cancer Maternal Aunt   . Diabetes Sister   . Cancer Maternal Aunt   . Cancer Maternal Aunt     DRUG ALLERGIES:  Allergies  Allergen Reactions  . Statins Swelling and Other (See Comments)    Reaction:  Rhabdomyolysis  . Ivp Dye [Iodinated Diagnostic Agents] Other (See Comments)    Reaction:  Unknown   . Penicillins Rash and Other (See Comments)    Unable to obtain enough information to answer additional questions about this medication.      Review of Systems  Unable to perform ROS: acuity of condition    MEDICATIONS AT HOME:  Prior to Admission medications   Medication Sig Start Date End Date Taking? Authorizing Provider  acetaminophen-codeine (TYLENOL #3) 300-30 MG tablet Take 1 tablet by mouth every 8 (eight) hours as  needed for moderate pain. 10/14/15  Yes Alba Cory, MD  amitriptyline (ELAVIL) 25 MG tablet Take 50 mg by mouth at bedtime.    Yes Historical Provider, MD  aspirin EC 81 MG tablet Take 81 mg by mouth daily.   Yes Historical Provider, MD  baclofen (LIORESAL) 20 MG tablet Take 20 mg by mouth at bedtime as needed for muscle spasms.   Yes Historical Provider, MD  docusate sodium (COLACE) 100 MG capsule Take 100 mg by mouth 2 (two) times daily as needed for mild constipation.   Yes Historical Provider, MD  gabapentin (NEURONTIN) 300 MG capsule Take 600 mg by mouth at bedtime.   Yes Historical Provider, MD  gemfibrozil (LOPID) 600 MG tablet Take 600  mg by mouth 2 (two) times daily.    Yes Historical Provider, MD  glipiZIDE (GLUCOTROL) 10 MG tablet Take 10 mg by mouth daily before breakfast.    Yes Historical Provider, MD  insulin aspart (NOVOLOG) 100 UNIT/ML injection Inject into the skin 3 (three) times daily with meals as needed for high blood sugar. Pt uses as needed per sliding scale.   Yes Historical Provider, MD  insulin detemir (LEVEMIR) 100 UNIT/ML injection Inject 45 Units into the skin at bedtime.   Yes Historical Provider, MD  lactulose (CHRONULAC) 10 GM/15ML solution Take 15 mLs by mouth 2 (two) times daily.    Yes Historical Provider, MD  Liraglutide 18 MG/3ML SOPN Inject 1.8 mg into the skin daily.    Yes Historical Provider, MD  metoCLOPramide (REGLAN) 5 MG tablet Take 1 tablet (5 mg total) by mouth daily as needed for nausea. 10/30/15  Yes Delma Freeze, FNP  metolazone (ZAROXOLYN) 5 MG tablet Take 5 mg by mouth daily as needed (for edema).    Yes Historical Provider, MD  metoprolol tartrate (LOPRESSOR) 25 MG tablet Take 0.5 tablets (12.5 mg total) by mouth 2 (two) times daily. 10/30/15  Yes Delma Freeze, FNP  potassium chloride SA (K-DUR,KLOR-CON) 20 MEQ tablet Take 20 mEq by mouth 2 (two) times daily.   Yes Historical Provider, MD  torsemide (DEMADEX) 100 MG tablet Take 1 tablet (100 mg total) by mouth 2 (two) times daily. 08/14/15  Yes Alba Cory, MD  venlafaxine XR (EFFEXOR-XR) 75 MG 24 hr capsule Take 3 capsules (225 mg total) by mouth daily. 10/14/15  Yes Alba Cory, MD      PHYSICAL EXAMINATION:   VITAL SIGNS: Blood pressure 92/53, pulse 101, resp. rate 24, weight 120.657 kg (266 lb), SpO2 97 %.  GENERAL:  60 y.o.-year-old patient lying in the bed , moderate to severe respiratory distress , agitated, uncomfortable .  EYES: Pupils equal, round, reactive to light and accommodation. No scleral icterus. Extraocular muscles intact.  HEENT: Head atraumatic, normocephalic. Oropharynx and nasopharynx clear.  NECK:   Supple, no jugular venous distention. No thyroid enlargement, no tenderness.  LUNGS:Diminished bilateralh sounds bilaterally, no wheezing, bilateral rales,rhonchi and crepititions.using  accessory muscles of respiration with breathing. BiPAP is on the face, very uncomfortable and agitated.  CARDIOVASCULAR: S1, S2the cardiac rhythm is regular, difficult to auscultate for any murmurs due to adventitial sounds in the lungs. No murmurs, rubs, or gallops.  ABDOMEN: Soft, nontender, nondistended. Obese, difficult to evaluate. Bowel sounds present. No organomegaly or mass.  EXTREMITIES:Bilateral lower extremity andl edema, cyanosis, or clubbing. Toes are cool to palpation bilaterally , right lower extremity toes surgically amputated , gangrenous areas were noted on the tips of the toes . Peripheral pulses are not difficult  to palpate  NEUROLOGIC: Cranial nerves II through XII are intact. Muscle strength 5/5 in all extremities. Sensation intact. Gait not checked.  PSYCHIATRIC: The patient is alert and oriented , Agitated and uncomfortable  SKIN: No obvious rash, lesion, or ulcer.   LABORATORY PANEL:   CBC  Recent Labs Lab 12/08/2015 1801  WBC 26.1*  HGB 10.6*  HCT 34.0*  PLT 488*  MCV 77.4*  MCH 24.1*  MCHC 31.1*  RDW 20.1*  LYMPHSABS 2.2  MONOABS 1.0  EOSABS 0.0  BASOSABS 0.0   ------------------------------------------------------------------------------------------------------------------  Chemistries   Recent Labs Lab 11/09/2015 1801  NA 138  K 3.2*  CL 100*  CO2 22  GLUCOSE 190*  BUN 69*  CREATININE 2.95*  CALCIUM 9.1  AST 18  ALT 11*  ALKPHOS 73  BILITOT 0.6   ------------------------------------------------------------------------------------------------------------------  Cardiac Enzymes  Recent Labs Lab 11/11/2015 1801  TROPONINI 1.01*    ------------------------------------------------------------------------------------------------------------------  RADIOLOGY: Dg Chest Portable 1 View  11/28/2015  CLINICAL DATA:  Difficulty breathing for several days. History of COPD. EXAM: PORTABLE CHEST 1 VIEW COMPARISON:  11/04/2015, 12 /6/16 FINDINGS: Normal cardiac silhouette. There is increased airspace density in the RIGHT lower lobe. Low lung volumes. Upper lungs are clear. No pneumothorax. IMPRESSION: RIGHT lower lobe pneumonia has worsened compared to 11/04/2015 and similar to 10/14/2015 suggesting recurrent pneumonia. Recommend follow-up CT to exclude underlying malignant lesion. Electronically Signed   By: Genevive Bi M.D.   On: 12/07/2015 18:36    EKG: Orders placed or performed during the hospital encounter of 12/02/2015  . EKG 12-Lead  . EKG 12-Lead  . ED EKG  . ED EKG  EKG reveals sinus or ectopic atrial tachycardia at rate of 120, borderline intraventricular conduction delay, low voltage QRS, nonspecific ST-T changes. Prolonged QTc interval to 535 ms  IMPRESSION AND PLAN:  Active Problems:   Right lower lobe pneumonia   Acute pulmonary edema (HCC) #1 Acute on chronic respiratory failure with hypoxia and hypercapnia due to right lower lobe pneumonia ,  admit patient to intensive care unit, initiate him on  antibiotics, broad-spectrum, continue BiPAP and  intubate patient if needed. Get pulmonologist involved and get CT scan of the chest without contrast #2. Right lower lobe pneumonia due to unknown bacterial agent, initiate levofloxacin and aztreonam due to history of penicillin allergy #3. Acute pulmonary edema, Lasix IV , follow ins and outs, get echocardiogram #4. CK D stage IV, followed with diuresis, get nephrologist involved. #5 hypotension, start Levophed keeping map at 65 and above. #6. Hypokalemia, supplement orally #7. Elevated troponin, likely demand ischemia, continue patient on aspirin, ,  heparin  subcutaneously, unable to use metoprolol or nitroglycerin due to hypotension #8 agitation, one dose of Geodon, watch for arrhythmias, get magnesium level checked and supplemented if needed   All the records are reviewed and case discussed with ED provider. Management plans discussed with the patient, family and they are in agreement.  CODE STATUS: Advance Directive Documentation        Most Recent Value   Type of Advance Directive  Healthcare Power of Attorney, Living will   Pre-existing out of facility DNR order (yellow form or pink MOST form)     "MOST" Form in Place?         TOTAL  critical care TIME TAKING CARE OF THIS PATIENT: 75  minutes.    Katharina Caper M.D on 11/11/2015 at 8:27 PM  Between 7am to 6pm - Pager - [phone] After 6pm go to [email or web address] - password  [facility]  [place]  [facility]  Office  [phone]  CC: Primary care physician; Ruel Favors, MD

## 2015-11-12 NOTE — Progress Notes (Signed)
eLink Physician-Brief Progress Note Patient Name: Carl HoehnMichael E Shukla DOB: 1956-02-15 MRN: [id number]   Date of Service  11/26/2015  HPI/Events of Note  New patient with respiratory failure and agitation. Borderline hypotension. S/P Geodon in ED. Patient now comfortable on camera check. Nurse reports he is following commands. Low threshold for intubation as patient remains full code.  eICU Interventions  Remainder of care per primary service.     Intervention Category Evaluation Type: New Patient Evaluation  Lawanda CousinsJennings Nestor , 10:38 PM

## 2015-11-12 NOTE — ED Provider Notes (Signed)
[facility] Emergency Department Provider Note  ____________________________________________  Time seen: Approximately 6:15 PM  I have reviewed the triage vital signs and the nursing notes.   HISTORY  Chief Complaint Respiratory Distress  Caveat-history of present illness and review of systems is limited due to the patient's severe respiratory distress. Information obtained from EMS on their arrival as well as wife at bedside.  HPI Carl Perry is a 60 y.o. male with COPD, diabetes, hypertension, CK D, hyperlipidemia, CHF who presents for evaluation of severe respiratory distress, unknown onset, currently severe. On EMS arrival, O2 saturations were in the 70s to 80s with supplemental oxygen and initiation of CPAP.   Past Medical History  Diagnosis Date  . COPD (chronic obstructive pulmonary disease) (HCC)   . Diabetes mellitus without complication (HCC)   . Hypertension   . CKD (chronic kidney disease)   . Sleep apnea   . Rhabdomyolysis   . Hyperlipidemia   . Morbid obesity (HCC)   . Depression   . CHF (congestive heart failure) (HCC)   . Allergy     Patient Active Problem List   Diagnosis Date Noted  . Acute pulmonary edema (HCC) 12/06/2015  . Right lower lobe pneumonia 12/02/2015  . Fatigue 10/29/2015  . Hepatic failure (HCC) 10/14/2015  . Hyperparathyroidism, secondary renal (HCC) 06/20/2015  . Proteinuria 06/20/2015  . Anemia of chronic disease 06/20/2015  . History of rhabdomyolysis 06/17/2015  . PVD (peripheral vascular disease) (HCC) 06/17/2015  . Ventral hernia 06/17/2015  . Hyperlipidemia 06/17/2015  . Status post amputation of left great toe (HCC) 06/17/2015  . COPD, moderate (HCC) 06/17/2015  . History of tobacco use 06/17/2015  . Major depression, recurrent, chronic (HCC) 06/17/2015  . OSA (obstructive sleep apnea) 04/30/2015  . Morbid obesity (HCC) 04/30/2015  . Chronic renal insufficiency, stage IV (severe) 04/30/2015  .  Chronic diastolic heart failure (HCC) 02/12/2015  . Type 2 diabetes mellitus with diabetic neuropathy (HCC) 04/16/2014    Past Surgical History  Procedure Laterality Date  . Toe amputation Right   . Foot surgery Right   . Tonsillectomy    . Abdominal hernia repair  1989  . Cardiac catheterization      [facility] no stents    Current Outpatient Rx  Name  Route  Sig  Dispense  Refill  . acetaminophen-codeine (TYLENOL #3) 300-30 MG tablet   Oral   Take 1 tablet by mouth every 8 (eight) hours as needed for moderate pain.   60 tablet   0   . amitriptyline (ELAVIL) 25 MG tablet   Oral   Take 50 mg by mouth at bedtime.          Marland Kitchen aspirin EC 81 MG tablet   Oral   Take 81 mg by mouth daily.         . baclofen (LIORESAL) 20 MG tablet   Oral   Take 20 mg by mouth at bedtime as needed for muscle spasms.         Marland Kitchen docusate sodium (COLACE) 100 MG capsule   Oral   Take 100 mg by mouth 2 (two) times daily as needed for mild constipation.         . gabapentin (NEURONTIN) 300 MG capsule   Oral   Take 600 mg by mouth at bedtime.         Marland Kitchen gemfibrozil (LOPID) 600 MG tablet   Oral   Take 600 mg by mouth 2 (two) times daily.          Marland Kitchen  glipiZIDE (GLUCOTROL) 10 MG tablet   Oral   Take 10 mg by mouth daily before breakfast.          . insulin aspart (NOVOLOG) 100 UNIT/ML injection   Subcutaneous   Inject into the skin 3 (three) times daily with meals as needed for high blood sugar. Pt uses as needed per sliding scale.         . insulin detemir (LEVEMIR) 100 UNIT/ML injection   Subcutaneous   Inject 45 Units into the skin at bedtime.         Marland Kitchen. lactulose (CHRONULAC) 10 GM/15ML solution   Oral   Take 15 mLs by mouth 2 (two) times daily.          . Liraglutide 18 MG/3ML SOPN   Subcutaneous   Inject 1.8 mg into the skin daily.          . metoCLOPramide (REGLAN) 5 MG tablet   Oral   Take 1 tablet (5 mg total) by mouth daily as needed for nausea.   90 tablet    3   . metolazone (ZAROXOLYN) 5 MG tablet   Oral   Take 5 mg by mouth daily as needed (for edema).          . metoprolol tartrate (LOPRESSOR) 25 MG tablet   Oral   Take 0.5 tablets (12.5 mg total) by mouth 2 (two) times daily.   90 tablet   3   . potassium chloride SA (K-DUR,KLOR-CON) 20 MEQ tablet   Oral   Take 20 mEq by mouth 2 (two) times daily.         Marland Kitchen. torsemide (DEMADEX) 100 MG tablet   Oral   Take 1 tablet (100 mg total) by mouth 2 (two) times daily.   180 tablet   1   . venlafaxine XR (EFFEXOR-XR) 75 MG 24 hr capsule   Oral   Take 3 capsules (225 mg total) by mouth daily.   270 capsule   1     Allergies Statins; Ivp dye; and Penicillins  Family History  Problem Relation Age of Onset  . Cancer Mother     Pancreatic  . Diabetes Sister   . Kidney disease Sister   . Diabetes Maternal Grandmother   . Heart disease Sister     Heart Failure  . Cancer Maternal Aunt   . Diabetes Sister   . Cancer Maternal Aunt   . Cancer Maternal Aunt     Social History Social History  Substance Use Topics  . Smoking status: Former Smoker -- 2.00 packs/day for 35 years    Types: Cigarettes    Quit date: 11/08/2010  . Smokeless tobacco: Never Used  . Alcohol Use: No    Review of Systems Respiratory: + shortness of breath.   Caveat-history of present illness and review of systems is limited due to the patient's severe respiratory distress. Information obtained from EMS on their arrival as well as wife at bedside.  ____________________________________________   PHYSICAL EXAM:  VITAL SIGNS: ED Triage Vitals  Enc Vitals Group     BP --      Pulse --      Resp --      Temp --      Temp src --      SpO2 05-09-16 1757 87 %     Weight 05-09-16 1811 266 lb (120.657 kg)     Height --      Head Cir --  Peak Flow --      Pain Score --      Pain Loc --      Pain Edu? --      Excl. in GC? --     Constitutional: Alert, oriented, in severe respiratory  distress, speaking in short phrases. Eyes: Conjunctivae are normal. PERRL. EOMI. Head: Atraumatic. Nose: No congestion/rhinnorhea. Mouth/Throat: Mucous membranes are moist.  Oropharynx non-erythematous. Neck: No stridor.   Cardiovascular: tachycardic rate, regular rhythm. Grossly normal heart sounds.  Good peripheral circulation. Respiratory: Rales/crackles at the bases, severe tachypnea and increased work of breathing. Gastrointestinal: Soft and nontender. No distention. No CVA tenderness. Genitourinary: deferred Musculoskeletal: 3+ pitting edema bilateral lower extremities. Neurologic:  Normal speech and language. No gross focal neurologic deficits are appreciated.  Skin:  Skin is warm, dry and intact. No rash noted. Psychiatric: Mood and affect are normal. Speech and behavior are normal.  ____________________________________________   LABS (all labs ordered are listed, but only abnormal results are displayed)  Labs Reviewed  CBC WITH DIFFERENTIAL/PLATELET - Abnormal; Notable for the following:    WBC 26.1 (*)    RBC 4.39 (*)    Hemoglobin 10.6 (*)    HCT 34.0 (*)    MCV 77.4 (*)    MCH 24.1 (*)    MCHC 31.1 (*)    RDW 20.1 (*)    Platelets 488 (*)    Neutro Abs 22.8 (*)    All other components within normal limits  COMPREHENSIVE METABOLIC PANEL - Abnormal; Notable for the following:    Potassium 3.2 (*)    Chloride 100 (*)    Glucose, Bld 190 (*)    BUN 69 (*)    Creatinine, Ser 2.95 (*)    Total Protein 8.9 (*)    Albumin 2.6 (*)    ALT 11 (*)    GFR calc non Af Amer 22 (*)    GFR calc Af Amer 25 (*)    Anion gap 16 (*)    All other components within normal limits  TROPONIN I - Abnormal; Notable for the following:    Troponin I 1.01 (*)    All other components within normal limits  BRAIN NATRIURETIC PEPTIDE - Abnormal; Notable for the following:    B Natriuretic Peptide 834.0 (*)    All other components within normal limits  LACTIC ACID, PLASMA - Abnormal;  Notable for the following:    Lactic Acid, Venous 2.9 (*)    All other components within normal limits  BLOOD GAS, ARTERIAL - Abnormal; Notable for the following:    pH, Arterial 7.25 (*)    pCO2 arterial 58 (*)    pO2, Arterial 68 (*)    Acid-base deficit 2.5 (*)    All other components within normal limits  CULTURE, BLOOD (ROUTINE X 2)  CULTURE, BLOOD (ROUTINE X 2)  CULTURE, EXPECTORATED SPUTUM-ASSESSMENT  LACTIC ACID, PLASMA  MAGNESIUM   ____________________________________________  EKG  ED ECG REPORT I, Gayla Doss, the attending physician, personally viewed and interpreted this ECG.   Date: 12-04-15  EKG Time: 18:01  Rate: 120  Rhythm: sinus tachycardia  Axis: normal  Intervals: Borderline intraventricular conduction delay  ST&T Change: No acute ST elevation.  ____________________________________________  RADIOLOGY  CXR IMPRESSION: RIGHT lower lobe pneumonia has worsened compared to 11/04/2015 and similar to 10/14/2015 suggesting recurrent pneumonia. Recommend follow-up CT to exclude underlying malignant lesion. ____________________________________________   PROCEDURES  Procedure(s) performed: None  Critical Care performed: Yes, see critical care note(s).  Total critical care time spent 35 minutes.  ____________________________________________   INITIAL IMPRESSION / ASSESSMENT AND PLAN / ED COURSE  Pertinent labs & imaging results that were available during my care of the patient were reviewed by me and considered in my medical decision making (see chart for details).  Carl Perry is a 61 y.o. male with COPD, diabetes, hypertension, CK D, hyperlipidemia, CHF who presents for evaluation of severe respiratory distress. On exam, he is in severe respiratory distress, speaking in short phrases, tachypnea with increased work of breathing, audible Rales inserting for CHF exacerbation/flash pulmonary edema.Marland Kitchen He was transferred to our BiPAP with O2  saturations maintaining at 86% despite increasing settings. I discussed with him that I'm concerned that he is persistently hypoxic and he looks likel he is starting to tire-outout and requires intubation at this time. I discussed with him that it is unnecessary measure to save his life. He has refused. I discussed this again with his wife at bedside as well as my concern that without this breathing too, he could experience worsening oxygenation, his heart could stop and he could. He is still refusing and states "I understand I could die..I dont want a breathing tube. At this time, he does have capacity to refuse intubation. We'll continue with BiPAP, Lasix, obtain screening labs and anticipate admission.  ----------------------------------------- 6:27 PM on 26-Nov-2015 -----------------------------------------  O2 saturation 92% on BiPAP.  ----------------------------------------- 7:55 PM on 2015/11/26 -----------------------------------------  The patient appears more comfortable on BiPAP at this time. His oxygen saturations have improved to 97%. Chest x-rays concerning for possible recurrent pneumonia, Levaquin ordered however clinical picture is also concerning for CHF exacerbation and his BNP is elevated at 834, Lasix was given. White blood cell count is elevated at 26,000, hemoglobin shows anemia at 10.6. CMP and troponin pending. Case discussed with hospitalist, Dr. Winona Legato, for admission.   FINAL CLINICAL IMPRESSION(S) / ED DIAGNOSES  Final diagnoses:  SOB (shortness of breath)  Acute on chronic congestive heart failure, unspecified congestive heart failure type [facility])  Community acquired pneumonia      Gayla Doss, MD 11-26-15 2101

## 2015-11-12 NOTE — ED Notes (Signed)
Lab called, lactic acid 2.9, informed Dr Inocencio HomesGayle.

## 2015-11-12 NOTE — ED Notes (Signed)
Family at bedside. Pt refusing intubation. EDP informed pt and family of the risks

## 2015-11-12 NOTE — Progress Notes (Signed)
Transported pt to CCU 3 on Bipap without incident. Pt remains on Bipap and is tol well at this time.

## 2015-11-12 NOTE — Progress Notes (Signed)
ANTIBIOTIC CONSULT NOTE - INITIAL  Pharmacy Consult for Levaquin, Aztreonam  Indication: pneumonia  Allergies  Allergen Reactions  . Statins Swelling and Other (See Comments)    Reaction:  Rhabdomyolysis  . Ivp Dye [Iodinated Diagnostic Agents] Other (See Comments)    Reaction:  Unknown   . Penicillins Rash and Other (See Comments)    Unable to obtain enough information to answer additional questions about this medication.      Patient Measurements: Weight: 266 lb (120.657 kg) Adjusted Body Weight:   Vital Signs: BP: 87/62 mmHg (01/04 2028) Pulse Rate: 86 (01/04 2028) Intake/Output from previous day:   Intake/Output from this shift:    Labs:  Recent Labs  11/13/2015 1801  WBC 26.1*  HGB 10.6*  PLT 488*  CREATININE 2.95*   Estimated Creatinine Clearance: 35.1 mL/min (by C-G formula based on Cr of 2.95). No results for input(s): VANCOTROUGH, VANCOPEAK, VANCORANDOM, GENTTROUGH, GENTPEAK, GENTRANDOM, TOBRATROUGH, TOBRAPEAK, TOBRARND, AMIKACINPEAK, AMIKACINTROU, AMIKACIN in the last 72 hours.   Microbiology: No results found for this or any previous visit (from the past 720 hour(s)).  Medical History: Past Medical History  Diagnosis Date  . COPD (chronic obstructive pulmonary disease) (HCC)   . Diabetes mellitus without complication (HCC)   . Hypertension   . CKD (chronic kidney disease)   . Sleep apnea   . Rhabdomyolysis   . Hyperlipidemia   . Morbid obesity (HCC)   . Depression   . CHF (congestive heart failure) (HCC)   . Allergy     Medications:   (Not in a hospital admission) Assessment: CrCl = 35.1 ml/min   Goal of Therapy:  resolution of infection   Plan:  Expected duration 7 days with resolution of temperature and/or normalization of WBC   Levaquin 750 mg IV X 1 given on 1/4 @ 20:00. Levaquin 750 mg PO Q48H to 1/6 @ 18:00.   Aztreonam 2 gm IV Q8H ordered to start on 1/4 .   Lounette Sloan D 12/08/2015,8:45 PM

## 2015-11-12 NOTE — ED Notes (Signed)
Pt denying intubation at this time, attempting contact with wife

## 2015-11-12 NOTE — ED Notes (Signed)
Pt non compliant, wants to sit up in bed, continues to want ice chips, states he is going to walk out of here. Family at bedside. Hospitalist at bedside.  Pt sitting at side of bed despite my efforts to inform that it is critical for pt to remain in bed.

## 2015-11-13 ENCOUNTER — Other Ambulatory Visit: Payer: Self-pay | Admitting: *Deleted

## 2015-11-13 ENCOUNTER — Encounter: Payer: Self-pay | Admitting: Physician Assistant

## 2015-11-13 ENCOUNTER — Telehealth: Payer: Self-pay

## 2015-11-13 ENCOUNTER — Inpatient Hospital Stay (HOSPITAL_COMMUNITY)
Admit: 2015-11-13 | Discharge: 2015-11-13 | Disposition: A | Discharge status: Home / Self Care | Payer: Commercial Managed Care - HMO | Attending: Cardiovascular Disease | Admitting: Cardiovascular Disease

## 2015-11-13 DIAGNOSIS — J13 Pneumonia due to Streptococcus pneumoniae: Secondary | ICD-10-CM

## 2015-11-13 DIAGNOSIS — I214 Non-ST elevation (NSTEMI) myocardial infarction: Secondary | ICD-10-CM | POA: Insufficient documentation

## 2015-11-13 DIAGNOSIS — J810 Acute pulmonary edema: Secondary | ICD-10-CM

## 2015-11-13 DIAGNOSIS — J189 Pneumonia, unspecified organism: Secondary | ICD-10-CM

## 2015-11-13 DIAGNOSIS — J9601 Acute respiratory failure with hypoxia: Secondary | ICD-10-CM

## 2015-11-13 DIAGNOSIS — J449 Chronic obstructive pulmonary disease, unspecified: Secondary | ICD-10-CM

## 2015-11-13 DIAGNOSIS — R6520 Severe sepsis without septic shock: Secondary | ICD-10-CM

## 2015-11-13 DIAGNOSIS — N358 Other urethral stricture: Secondary | ICD-10-CM

## 2015-11-13 DIAGNOSIS — I509 Heart failure, unspecified: Secondary | ICD-10-CM

## 2015-11-13 DIAGNOSIS — A419 Sepsis, unspecified organism: Secondary | ICD-10-CM

## 2015-11-13 DIAGNOSIS — Z466 Encounter for fitting and adjustment of urinary device: Secondary | ICD-10-CM

## 2015-11-13 DIAGNOSIS — R338 Other retention of urine: Secondary | ICD-10-CM

## 2015-11-13 DIAGNOSIS — N179 Acute kidney failure, unspecified: Secondary | ICD-10-CM

## 2015-11-13 DIAGNOSIS — N184 Chronic kidney disease, stage 4 (severe): Secondary | ICD-10-CM

## 2015-11-13 DIAGNOSIS — J9690 Respiratory failure, unspecified, unspecified whether with hypoxia or hypercapnia: Secondary | ICD-10-CM | POA: Insufficient documentation

## 2015-11-13 LAB — CBC
HCT: 31.3 % — ABNORMAL LOW (ref 40.0–52.0)
HEMOGLOBIN: 9.7 g/dL — AB (ref 13.0–18.0)
MCH: 23.6 pg — AB (ref 26.0–34.0)
MCHC: 31 g/dL — AB (ref 32.0–36.0)
MCV: 76 fL — ABNORMAL LOW (ref 80.0–100.0)
Platelets: 427 10*3/uL (ref 150–440)
RBC: 4.12 MIL/uL — ABNORMAL LOW (ref 4.40–5.90)
RDW: 19.7 % — AB (ref 11.5–14.5)
WBC: 38.7 10*3/uL — ABNORMAL HIGH (ref 3.8–10.6)

## 2015-11-13 LAB — GLUCOSE, CAPILLARY
GLUCOSE-CAPILLARY: 84 mg/dL (ref 65–99)
GLUCOSE-CAPILLARY: 70 mg/dL (ref 65–99)
GLUCOSE-CAPILLARY: 70 mg/dL (ref 65–99)
GLUCOSE-CAPILLARY: 80 mg/dL (ref 65–99)
Glucose-Capillary: 107 mg/dL — ABNORMAL HIGH (ref 65–99)
Glucose-Capillary: 119 mg/dL — ABNORMAL HIGH (ref 65–99)
Glucose-Capillary: 86 mg/dL (ref 65–99)

## 2015-11-13 LAB — TROPONIN I
TROPONIN I: 11.85 ng/mL — AB (ref <0.031)
Troponin I: 18.9 ng/mL — ABNORMAL HIGH (ref <0.031)
Troponin I: 32.74 ng/mL — ABNORMAL HIGH (ref <0.031)

## 2015-11-13 LAB — PROTIME-INR
INR: 1.37
Prothrombin Time: 17 seconds — ABNORMAL HIGH (ref 11.4–15.0)

## 2015-11-13 LAB — HEPARIN LEVEL (UNFRACTIONATED): Heparin Unfractionated: <0.1 IU/mL — ABNORMAL LOW (ref 0.30–0.70)

## 2015-11-13 LAB — APTT: aPTT: 43 seconds — ABNORMAL HIGH (ref 24–36)

## 2015-11-13 LAB — BASIC METABOLIC PANEL
Anion gap: 16 — ABNORMAL HIGH (ref 5–15)
BUN: 70 mg/dL — AB (ref 6–20)
CALCIUM: 9.1 mg/dL (ref 8.9–10.3)
CO2: 23 mmol/L (ref 22–32)
CREATININE: 3.24 mg/dL — AB (ref 0.61–1.24)
Chloride: 99 mmol/L — ABNORMAL LOW (ref 101–111)
GFR calc Af Amer: 23 mL/min — ABNORMAL LOW (ref >60)
GFR calc non Af Amer: 19 mL/min — ABNORMAL LOW (ref >60)
GLUCOSE: 121 mg/dL — AB (ref 65–99)
Potassium: 3.3 mmol/L — ABNORMAL LOW (ref 3.5–5.1)
Sodium: 138 mmol/L (ref 135–145)

## 2015-11-13 LAB — MRSA PCR SCREENING: MRSA BY PCR: NEGATIVE

## 2015-11-13 LAB — EXPECTORATED SPUTUM ASSESSMENT W GRAM STAIN, RFLX TO RESP C: Special Requests: NORMAL

## 2015-11-13 LAB — PROCALCITONIN: PROCALCITONIN: 43.59 ng/mL

## 2015-11-13 LAB — EXPECTORATED SPUTUM ASSESSMENT W REFEX TO RESP CULTURE

## 2015-11-13 LAB — TSH: TSH: 0.634 u[IU]/mL (ref 0.350–4.500)

## 2015-11-13 LAB — HEMOGLOBIN A1C: Hgb A1c MFr Bld: 5.2 % (ref 4.0–6.0)

## 2015-11-13 MED ORDER — DEXTROSE 5 % IV SOLN
2.0000 g | INTRAVENOUS | Status: DC
  Administered 2015-11-14 – 2015-11-16 (×3): 2 g via INTRAVENOUS
  Filled 2015-11-13 (×4): qty 2

## 2015-11-13 MED ORDER — DEXTROSE 5 % IV SOLN
2.0000 g | Freq: Once | INTRAVENOUS | Status: AC
  Administered 2015-11-13: 2 g via INTRAVENOUS
  Filled 2015-11-13: qty 2

## 2015-11-13 MED ORDER — DEXTROSE 5 % IV SOLN
500.0000 mg | INTRAVENOUS | Status: DC
  Filled 2015-11-13: qty 500

## 2015-11-13 MED ORDER — INSULIN ASPART 100 UNIT/ML ~~LOC~~ SOLN
0.0000 [IU] | SUBCUTANEOUS | Status: DC
  Administered 2015-11-14: 2 [IU] via SUBCUTANEOUS
  Filled 2015-11-13: qty 2

## 2015-11-13 MED ORDER — LIDOCAINE HCL 2 % EX GEL
1.0000 "application " | Freq: Once | CUTANEOUS | Status: DC
  Filled 2015-11-13: qty 5

## 2015-11-13 MED ORDER — LEVOFLOXACIN IN D5W 750 MG/150ML IV SOLN: 750.0000 mg | INTRAVENOUS | Status: DC

## 2015-11-13 MED ORDER — HEPARIN BOLUS VIA INFUSION
3000.0000 [IU] | Freq: Once | INTRAVENOUS | Status: AC
Start: 2015-11-13 — End: 2015-11-13
  Administered 2015-11-13: 3000 [IU] via INTRAVENOUS
  Filled 2015-11-13: qty 3000

## 2015-11-13 MED ORDER — HEPARIN (PORCINE) IN NACL 100-0.45 UNIT/ML-% IJ SOLN
2700.0000 [IU]/h | INTRAMUSCULAR | Status: DC
Start: 2015-11-13 — End: 2015-11-16
  Administered 2015-11-13: 1000 [IU]/h via INTRAVENOUS
  Administered 2015-11-14: 2200 [IU]/h via INTRAVENOUS
  Administered 2015-11-15: 2500 [IU]/h via INTRAVENOUS
  Administered 2015-11-15 (×2): 2700 [IU]/h via INTRAVENOUS
  Filled 2015-11-13 (×16): qty 250

## 2015-11-13 MED ORDER — ALBUTEROL SULFATE (2.5 MG/3ML) 0.083% IN NEBU
2.5000 mg | INHALATION_SOLUTION | RESPIRATORY_TRACT | Status: DC | PRN
  Administered 2015-11-13: 2.5 mg via RESPIRATORY_TRACT

## 2015-11-13 MED ORDER — SALINE SPRAY 0.65 % NA SOLN
1.0000 | NASAL | Status: DC | PRN
  Administered 2015-11-13 – 2015-11-15 (×8): 1 via NASAL
  Filled 2015-11-13: qty 44

## 2015-11-13 MED ORDER — HEPARIN BOLUS VIA INFUSION
3000.0000 [IU] | Freq: Once | INTRAVENOUS | Status: AC
  Administered 2015-11-13: 3000 [IU] via INTRAVENOUS
  Filled 2015-11-13: qty 3000

## 2015-11-13 MED ORDER — LIDOCAINE HCL 2 % EX GEL
1.0000 "application " | Freq: Once | CUTANEOUS | Status: AC
  Administered 2015-11-13: 1 via URETHRAL
  Filled 2015-11-13: qty 10

## 2015-11-13 NOTE — Care Management (Signed)
Patient well known to this CM.  He is currently on continuous biap.  Is refusing intubation.  May benefit from having palliative care follow. Will address during progression

## 2015-11-13 NOTE — Progress Notes (Signed)
CRITICAL VALUE ALERT  Critical value received: Trop 32.74  Date of notification:  11/13/2015  Time of notification:  1050  Critical value read back:Yes.    Nurse who received alert:  Mardene CelesteJoanna  MD notified (1st page):  Simonds  Time of first page:   MD notified (2nd page):  Time of second page:  Responding MD:  Sung AmabileSimonds  Time MD responded:  1055

## 2015-11-13 NOTE — Progress Notes (Signed)
Pt returned to BIPAP due increased WOB.

## 2015-11-13 NOTE — Progress Notes (Signed)
ANTICOAGULATION CONSULT NOTE - Follow-up Consult  Pharmacy Consult for heparin Indication: chest pain/ACS  Allergies  Allergen Reactions  . Statins Swelling and Other (See Comments)    Reaction:  Rhabdomyolysis  . Ivp Dye [Iodinated Diagnostic Agents] Other (See Comments)    Reaction:  Unknown   . Penicillins Rash and Other (See Comments)    Unable to obtain enough information to answer additional questions about this medication.      Patient Measurements: Height: 5\' 9"  (175.3 cm) Weight: 283 lb 4.7 oz (128.5 kg) IBW/kg (Calculated) : 70.7 Heparin Dosing Weight: 100.4 kg  Vital Signs: Temp: 98.7 F (37.1 C) (01/05 1600) Temp Source: Axillary (01/05 1600) BP: 106/61 mmHg (01/05 1600) Pulse Rate: 74 (01/05 1600)  Labs:  Recent Labs  2016-01-09 1801 11/13/15 0033 11/13/15 0314 11/13/15 1008 11/13/15 1928  HGB 10.6*  --  9.7*  --   --   HCT 34.0*  --  31.3*  --   --   PLT 488*  --  427  --   --   APTT  --   --  43*  --   --   LABPROT  --   --  17.0*  --   --   INR  --   --  1.37  --   --   HEPARINUNFRC  --   --   --  <0.10* <0.10*  CREATININE 2.95*  --  3.24*  --   --   TROPONINI 1.01* 11.85* 18.90* 32.74*  --     Estimated Creatinine Clearance: 32.6 mL/min (by C-G formula based on Cr of 3.24).   Medical History: Past Medical History  Diagnosis Date  . COPD (chronic obstructive pulmonary disease) (HCC)     a. on intermittent home O2  . Diabetes mellitus (HCC)   . Hypertension   . CKD (chronic kidney disease), stage IV (HCC)   . Sleep apnea   . Rhabdomyolysis   . Hyperlipidemia   . Morbid obesity (HCC)   . Depression   . Chronic diastolic CHF (congestive heart failure) (HCC)     a. echo 11/2013: EF 60-65%, mild LVH, mildly increased LV internal cavity size, moderately increased LV posterior wall thickness; b. echo 1/17: EF 45-50%, severe HK of apical, lateral, & anterolateral myocardium, technically difficult images other regions of WMA cannot be excluded,  GR1DD, mild MR, LA mild to mod dilated, PASP nl  . Allergy   . Chronic liver failure (HCC)   . Anemia of chronic disease   . Polysubstance abuse     a. prior abuse of benzos and acid  . PNA (pneumonia)     a. recurrent PNA 10/2015 and 11/2015 leading to acute respiratory failure    Medications:  Infusions:  . heparin 1,400 Units/hr (11/13/15 1331)    Assessment: 60 yo male with respiratory failure and agitation, troponin positive. Pharmacy consulted to dose heparin for ACS. Received [facility] at 0120 will not bolus. Patient initiated on heparin drip at 1000 units (10mL) per hour.    Goal of Therapy:  Heparin level 0.3-0.7 units/ml Monitor platelets by anticoagulation protocol: Yes   Plan:  Heparin level remains below goal so will bolus heparin 3000 units iv once and increase infusion to 1800 units/hr. Next HL in 6 hours.  Pharmacy will continue to monitor and adjust per consult.    Luisa Harthristy, Rory Xiang D, Pharm.D. Clinical Pharmacist 11/13/2015,8:14 PM

## 2015-11-13 NOTE — Consult Note (Addendum)
PULMONARY CONSULT NOTE  Requesting MD/Service: Sainani/[facility] Date of consult: 01/05 Reason for consultation: Acute hypoxic respiratory failure PT PROFILE: 38 M with DM 2, CAD, CHF, CKD (baseline Cr 2.41) adm 01/04 with acute respiratory failure, cough, purulent sputum of one week duration. CXR revealed RLL consolidation. Admitted with diagnosis of CAP to ICU/SDU for BiPAP after ABG revealed respiratory acidosis. Has ruled in for NSTEMI and has worsening renal function.  MAJOR EVENTS/TEST RESULTS: 01/05 TTE:   INDWELLING DEVICES::   MICRO DATA: Resp 01/05 >> inadequate specimen Blood 01/04 >> Pneumococcus   ANTIMICROBIALS:  Aztreonam 01/04 >> 01/05 Levofloxacin 01/04 >>    HPI:  As above. Denies fever, hemoptysis. Tolerated off BiPAP only approx 30 mins this AM. Pt has refused intubation and Foley cath placement. Wife reports that he has been weak X 3-4 days  Past Medical History  Diagnosis Date  . COPD (chronic obstructive pulmonary disease) (HCC)   . Diabetes mellitus without complication (HCC)   . Hypertension   . CKD (chronic kidney disease), stage IV (HCC)   . Sleep apnea   . Rhabdomyolysis   . Hyperlipidemia   . Morbid obesity (HCC)   . Depression   . Chronic diastolic CHF (congestive heart failure) (HCC)   . Allergy     Past Surgical History  Procedure Laterality Date  . Toe amputation Right   . Foot surgery Right   . Tonsillectomy    . Abdominal hernia repair  1989  . Cardiac catheterization      [facility] no stents  H/o urethral trauma after Foley cath removal  MEDICATIONS: I have reviewed all medications and confirmed regimen as documented  Social History   Social History  . Marital Status: Married    Spouse Name: N/A  . Number of Children: N/A  . Years of Education: N/A   Occupational History  . Not on file.   Social History Main Topics  . Smoking status: Former Smoker -- 2.00 packs/day for 35 years    Types: Cigarettes    Quit date: 11/08/2010   . Smokeless tobacco: Never Used  . Alcohol Use: No  . Drug Use: No  . Sexual Activity: Yes   Other Topics Concern  . Not on file   Social History Narrative    Family History  Problem Relation Age of Onset  . Cancer Mother     Pancreatic  . Diabetes Sister   . Kidney disease Sister   . Diabetes Maternal Grandmother   . Heart disease Sister     Heart Failure  . Cancer Maternal Aunt   . Diabetes Sister   . Cancer Maternal Aunt   . Cancer Maternal Aunt     ROS: No myalgias/arthralgias, unexplained weight loss or weight gain No new focal weakness or sensory deficits No otalgia, hearing loss, visual changes, nasal and sinus symptoms, mouth and throat problems No neck pain or adenopathy No abdominal pain, N/V/D, diarrhea, change in bowel pattern No dysuria, change in urinary pattern No LE edema or calf tenderness   Filed Vitals:   11/13/15 0800 11/13/15 0915 11/13/15 0930 11/13/15 1121  BP: 107/61 101/84 125/75   Pulse: 69 87 82   Temp:      TempSrc:      Resp: 23 20 32   Height:      Weight:      SpO2: 100% 95% 97% 100%     EXAM:  Gen: Appears much older than documented age, On BiPAP HEENT: NCAT, oropharynx normal  Neck: Supple without LAN, thyromegaly,  No JVD noted Lungs: breath sounds full, diffuse scattered rhonchi Cardiovascular: Reg rhythm, rate normal, no murmurs noted Abdomen: Soft, nontender, normal BS Ext: severe chronic stasis changes, symmetric, severe pretibial edema Neuro: CNs grossly intact, motor and sensory intact, DTRs symmetric  DATA:   BMP Latest Ref Rng 11/13/2015 11/19/2015 10/21/2015  Glucose 65 - 99 mg/dL 324(M121(H) 010(U190(H) -  BUN 6 - 20 mg/dL 72(Z70(H) 36(U69(H) -  Creatinine 0.61 - 1.24 mg/dL 4.40(H3.24(H) 4.74(Q2.95(H) -  Sodium 135 - 145 mmol/L 138 138 -  Potassium 3.5 - 5.1 mmol/L 3.3(L) 3.2(L) 4.3  Chloride 101 - 111 mmol/L 99(L) 100(L) -  CO2 22 - 32 mmol/L 23 22 -  Calcium 8.9 - 10.3 mg/dL 9.1 9.1 -    CBC Latest Ref Rng 11/13/2015 11/30/2015 03/09/2015   WBC 3.8 - 10.6 K/uL 38.7(H) 26.1(H) -  Hemoglobin 13.0 - 18.0 g/dL 5.9(D9.7(L) 10.6(L) -  Hematocrit 40.0 - 52.0 % 31.3(L) 34.0(L) -  Platelets 150 - 440 K/uL 427 488(H) 318    CXR:  RLL consolidation  IMPRESSION:   Acute hypoxic/hypercarbic respiratory failure  High risk of intubation H/O COPD without acute bronchospasm Transient hypotension - resolved NSTEMI AKI on CKD (baseline Cr 2.41) Mild hypokalemia DM 2 Pneumococcal PNA with bacteremia and severe sepsis Chronic anemia without overt acute blood loss   PLAN:  Cont BiPAP PRN Cont supplemental O2 to maintain SpO2 92-97% Monitor in ICU/SDU until fully liberated from ventilatory support (inc noninvasive ventilation) Cards consult pending Echocardiogram performed - results pending Renal Consult noted Monitor BMET intermittently Monitor I/Os Correct electrolytes as indicated Urology assistance with Foley catheter placement appreciated NPO until risk of intubation reduced SSI changed to q 4hrs until diety resumed Monitor temp, WBC count Micro and abx as above DVT px: SQ heparin Monitor CBC intermittently Transfuse per usual ICU guidelines  CCM time: 45 mins The above time includes time spent in consultation with patient and/or family members and reviewing care plan on multidisciplinary rounds  Billy Fischeravid Sejal Cofield, MD PCCM service Mobile [phone] Pager [phone]

## 2015-11-13 NOTE — Patient Outreach (Signed)
[facility] Care Management  11/13/2015  Carl HoehnMichael E Perry Dec 02, 1955 [id number]   Subjective: Received voicemail message from patient's wife Carl MinerRachel Perry.  Patient has given verbal permission during previous conversations for [facility] speak with patient's wife Carl MinerRachel Perry.  Mrs. Carl Perry states [facility] and is currently in ICU room #3.   Request call back on home number [phone]) or cell phone number [phone]).   TCT [facility]ia[facility]  and advised of patient's admission.   States she will refer patient to [facility] RN[facility] for transition of care.  TCT patient's home number, no answer and no message left.    TCT patient's wife cell phone, spoke with wife and HIPAA verified.   States [facility] with myocardial infarction, fluid retention, unable to urinate, and had two recent  falls at home.   States patient had went out with her to run errands on 11/11/15 and fallen in the yard.  States patient also fell earlier the next morning 11/09/2015 in the bathroom and requested to be taken to the hospital because he felt so bad.   RNCM advised wife that patient has been [facility] for transition of care and home visit.  RNCM advised wife to call [facility] if she has any further questions.  Assessment: Per Epic and patient's wife,  [facility].  [facility]  and [facility] RN[facility] will follow up with patient for transition of care and care coordination.  No further [facility] Tele[facility] RNCM needs at this time.   Plan:  RNCM will transition patient to hospital liaison for inpatient follow up.  Svetlana Bagby H. Gardiner Barefootooper RN, BSN, CCM [facility] Care [facility] Telephonic CM Phone: [phone] Fax: [phone]

## 2015-11-13 NOTE — Consult Note (Signed)
Cardiology Consultation Note  Patient ID: Carl Perry, MRN: [id number], DOB/AGE: Mar 22, 1956 60 y.o. Admit date: 11/29/2015   Date of Consult: 11/13/2015 Primary Physician: Ruel Favors, MD Primary Cardiologist: Dr. Mariah Milling, MD  Chief Complaint: SOB and cough Reason for Consult: NSTEMI  HPI: 60 y.o. male with h/o chronic diastolic CHF, CKD stage IV, COPD on intermittent home oxygen, chronic liver failure, anemia of chronic disease, morbid obesity, sleep apnea with possible OHS, prior tobacco abuse, history of polysubstance abuse with history of acid and benzo abuse who was recently diagnosed with RLL PNA in early December s/p ABX with follow up CXR on 12/27 showing improvement who presented to [facility] on 1/4 with increased SOB and was found to have worsening acute respiratory failure in the setting of RLL PNA c/w prior study on 12/6 and a NSTEMI with troponin at the time of cardiology consult of 32.   He has never had an ischemic evaluation with stress testing or cardiac catheterization. Prior echo from admission 11/2013  For acute on chronic renal failure, rhabdo, and elevated troponin in the setting of rhabdo showed normal LV systolic function, mild LVH, mild to moderately increased LV internal cavity size, moderately increased LV posterior wall thickness. He was hospitalized in 02/2015 for acute on chronic diastolic CHF. He diuresed well. He was discharged on torsemide 100 mg bid with occasional metolazone for leg swelling. He occasionally has to use prn oxygen at home for SOB. He is followed by Dr. Ronn Melena for his CKD stage IV with a baseline SCr of 2.69. He has been followed most recently by the CHF clinic.   Over the past couple of days he was noticing his breathing worsening similar to when he was originally diagnosed with PNA on 12/6. He needed to use his oxygen at home for the past 1 week when previously he did not. His weight had remained the same. He had remained afebrile. No productive  cough. Never with any chest pain. Because of his SOB he presented to [facility].    Upon the patient's arrival to [facility] they were found to have troponin 1.01-->11.85-->18.90-->32.74, procalcitonin of 43 on 1/5, WBC 26.1-->38.7, K+ 3.2-->3.3, SCr 2.95-->3.24, BUN 69-->70, lactic acid 2.9-->1.8, pCO2 of 58, pO2 of 68, pH 7.25, BNP 834. ECG as below, CXR showed right lower lobe PNA that had worsened when compared to 11/04/2015 film and similar to 10/14/2015 film, suggesting recurrent PNA. Follow CT was recommended to excluded underlying malignant lesion. He was started on heparin gtt and Levaquin. He has refused intubation. He has been tachypneic with RR into the 30s and hypotensive with SBP into the 70s-90s at times. He has never had any chest pain throughout any of the above, but has noted increased SOB. He is currently on BiPAP with oxygen saturations in the upper 90s to 100%.    Past Medical History  Diagnosis Date  . COPD (chronic obstructive pulmonary disease) (HCC)   . Diabetes mellitus without complication (HCC)   . Hypertension   . CKD (chronic kidney disease)   . Sleep apnea   . Rhabdomyolysis   . Hyperlipidemia   . Morbid obesity (HCC)   . Depression   . CHF (congestive heart failure) (HCC)   . Allergy       Most Recent Cardiac Studies: Echo 11/2013: Summary:  1. Left ventricular ejection fraction, by visual estimation, is 60 to  65%.  2. Mild left ventricular hypertrophy.  3. Mild to moderately increased left ventricular internal cavity size.  4. Mildly dilated left atrium.  5. Moderately increased left ventricular posterior wall thickness.   Surgical History:  Past Surgical History  Procedure Laterality Date  . Toe amputation Right   . Foot surgery Right   . Tonsillectomy    . Abdominal hernia repair  1989  . Cardiac catheterization      [facility] no stents     Home Meds: Prior to Admission medications   Medication Sig Start Date End Date Taking? Authorizing Provider    acetaminophen-codeine (TYLENOL #3) 300-30 MG tablet Take 1 tablet by mouth every 8 (eight) hours as needed for moderate pain. 10/14/15  Yes Alba Cory, MD  amitriptyline (ELAVIL) 25 MG tablet Take 50 mg by mouth at bedtime.    Yes Historical Provider, MD  aspirin EC 81 MG tablet Take 81 mg by mouth daily.   Yes Historical Provider, MD  baclofen (LIORESAL) 20 MG tablet Take 20 mg by mouth at bedtime as needed for muscle spasms.   Yes Historical Provider, MD  docusate sodium (COLACE) 100 MG capsule Take 100 mg by mouth 2 (two) times daily as needed for mild constipation.   Yes Historical Provider, MD  gabapentin (NEURONTIN) 300 MG capsule Take 600 mg by mouth at bedtime.   Yes Historical Provider, MD  gemfibrozil (LOPID) 600 MG tablet Take 600 mg by mouth 2 (two) times daily.    Yes Historical Provider, MD  glipiZIDE (GLUCOTROL) 10 MG tablet Take 10 mg by mouth daily before breakfast.    Yes Historical Provider, MD  insulin aspart (NOVOLOG) 100 UNIT/ML injection Inject into the skin 3 (three) times daily with meals as needed for high blood sugar. Pt uses as needed per sliding scale.   Yes Historical Provider, MD  insulin detemir (LEVEMIR) 100 UNIT/ML injection Inject 45 Units into the skin at bedtime.   Yes Historical Provider, MD  lactulose (CHRONULAC) 10 GM/15ML solution Take 15 mLs by mouth 2 (two) times daily.    Yes Historical Provider, MD  Liraglutide 18 MG/3ML SOPN Inject 1.8 mg into the skin daily.    Yes Historical Provider, MD  metoCLOPramide (REGLAN) 5 MG tablet Take 1 tablet (5 mg total) by mouth daily as needed for nausea. 10/30/15  Yes Delma Freeze, FNP  metolazone (ZAROXOLYN) 5 MG tablet Take 5 mg by mouth daily as needed (for edema).    Yes Historical Provider, MD  metoprolol tartrate (LOPRESSOR) 25 MG tablet Take 0.5 tablets (12.5 mg total) by mouth 2 (two) times daily. 10/30/15  Yes Delma Freeze, FNP  potassium chloride SA (K-DUR,KLOR-CON) 20 MEQ tablet Take 20 mEq by mouth 2  (two) times daily.   Yes Historical Provider, MD  torsemide (DEMADEX) 100 MG tablet Take 1 tablet (100 mg total) by mouth 2 (two) times daily. 08/14/15  Yes Alba Cory, MD  venlafaxine XR (EFFEXOR-XR) 75 MG 24 hr capsule Take 3 capsules (225 mg total) by mouth daily. 10/14/15  Yes Alba Cory, MD    Inpatient Medications:  . amitriptyline  50 mg Oral QHS  . antiseptic oral rinse  7 mL Mouth Rinse q12n4p  . aspirin EC  81 mg Oral Daily  . chlorhexidine  15 mL Mouth Rinse BID  . gabapentin  600 mg Oral QHS  . gemfibrozil  600 mg Oral BID  . insulin aspart  0-15 Units Subcutaneous 6 times per day  . lactulose  10 g Oral BID  . [START ON 11/14/2015] levofloxacin (LEVAQUIN) IV  750 mg Intravenous Q48H  .  sodium chloride  3 mL Intravenous Q12H  . sodium chloride  3 mL Intravenous Q12H  . venlafaxine XR  225 mg Oral Daily   . heparin 1,000 Units/hr (11/13/15 0427)    Allergies:  Allergies  Allergen Reactions  . Statins Swelling and Other (See Comments)    Reaction:  Rhabdomyolysis  . Ivp Dye [Iodinated Diagnostic Agents] Other (See Comments)    Reaction:  Unknown   . Penicillins Rash and Other (See Comments)    Unable to obtain enough information to answer additional questions about this medication.      Social History   Social History  . Marital Status: Married    Spouse Name: N/A  . Number of Children: N/A  . Years of Education: N/A   Occupational History  . Not on file.   Social History Main Topics  . Smoking status: Former Smoker -- 2.00 packs/day for 35 years    Types: Cigarettes    Quit date: 11/08/2010  . Smokeless tobacco: Never Used  . Alcohol Use: No  . Drug Use: No  . Sexual Activity: Yes   Other Topics Concern  . Not on file   Social History Narrative     Family History  Problem Relation Age of Onset  . Cancer Mother     Pancreatic  . Diabetes Sister   . Kidney disease Sister   . Diabetes Maternal Grandmother   . Heart disease Sister      Heart Failure  . Cancer Maternal Aunt   . Diabetes Sister   . Cancer Maternal Aunt   . Cancer Maternal Aunt      Review of Systems: Review of Systems  Constitutional: Positive for malaise/fatigue. Negative for fever, chills, weight loss and diaphoresis.  HENT: Negative for congestion.   Eyes: Negative for discharge and redness.  Respiratory: Positive for cough, shortness of breath and wheezing. Negative for hemoptysis and sputum production.   Cardiovascular: Positive for orthopnea, leg swelling and PND. Negative for chest pain, palpitations and claudication.  Gastrointestinal: Negative for heartburn, nausea, vomiting and abdominal pain.  Musculoskeletal: Negative for myalgias and falls.  Skin: Negative for rash.  Neurological: Positive for weakness. Negative for dizziness, sensory change, speech change, focal weakness and loss of consciousness.  Endo/Heme/Allergies: Does not bruise/bleed easily.  Psychiatric/Behavioral: Negative for substance abuse. The patient is not nervous/anxious.     Labs:  Recent Labs  12-02-2015 1801 11/13/15 0033 11/13/15 0314 11/13/15 1008  TROPONINI 1.01* 11.85* 18.90* 32.74*   Lab Results  Component Value Date   WBC 38.7* 11/13/2015   HGB 9.7* 11/13/2015   HCT 31.3* 11/13/2015   MCV 76.0* 11/13/2015   PLT 427 11/13/2015    Recent Labs Lab 02-Dec-2015 1801 11/13/15 0314  NA 138 138  K 3.2* 3.3*  CL 100* 99*  CO2 22 23  BUN 69* 70*  CREATININE 2.95* 3.24*  CALCIUM 9.1 9.1  PROT 8.9*  --   BILITOT 0.6  --   ALKPHOS 73  --   ALT 11*  --   AST 18  --   GLUCOSE 190* 121*   Lab Results  Component Value Date   CHOL 122 10/21/2015   HDL 32* 10/21/2015   LDLCALC 52 10/21/2015   TRIG 190* 10/21/2015   No results found for: DDIMER  Radiology/Studies:  Dg Chest 2 View  11/04/2015  CLINICAL DATA:  Pneumonia. EXAM: CHEST  2 VIEW COMPARISON:  None. FINDINGS: Mediastinum and hilar structures normal. Right lower lobe and middle lobe  atelectasis and infiltrate, improved from prior exam. No pleural effusion or pneumothorax. Cardiomegaly with normal pulmonary vascularity. No acute bony abnormality. IMPRESSION: 1. Right middle lobe and right lower lobe atelectasis and infiltrate improved from prior exam . 2. Cardiomegaly. Electronically Signed   By: Maisie Fus  Register   On: 11/04/2015 12:08   Dg Chest 2 View  10/14/2015  CLINICAL DATA:  Abnormal lung sounds with worsening cough. Illness for 3 weeks. EXAM: CHEST  2 VIEW COMPARISON:  03/05/2015 FINDINGS: Dense consolidation in the right lower and middle lobes. No evidence of cavitation or effusion. Normal heart size. Clear left lung. IMPRESSION: Right basilar pneumonia. Followup PA and lateral chest X-ray is recommended in 3-4 weeks following trial of antibiotic therapy to ensure resolution. Electronically Signed   By: Marnee Spring M.D.   On: 10/14/2015 16:43   Dg Chest Portable 1 View  11/09/2015  CLINICAL DATA:  Difficulty breathing for several days. History of COPD. EXAM: PORTABLE CHEST 1 VIEW COMPARISON:  11/04/2015, 12 /6/16 FINDINGS: Normal cardiac silhouette. There is increased airspace density in the RIGHT lower lobe. Low lung volumes. Upper lungs are clear. No pneumothorax. IMPRESSION: RIGHT lower lobe pneumonia has worsened compared to 11/04/2015 and similar to 10/14/2015 suggesting recurrent pneumonia. Recommend follow-up CT to exclude underlying malignant lesion. Electronically Signed   By: Genevive Bi M.D.   On: 11/30/2015 18:36    EKG: NSR, with 1st degree AV block, right axis, poor R wave progression, lateral TWI    Weights: Filed Weights   11/10/2015 1811  2200  Weight: 266 lb (120.657 kg) 283 lb 4.7 oz (128.5 kg)     Physical Exam: Blood pressure 125/75, pulse 82, temperature 97.8 F (36.6 C), temperature source Axillary, resp. rate 32, height 5\' 9"  (1.753 m), weight 283 lb 4.7 oz (128.5 kg), SpO2 97 %. Body mass index is 41.82 kg/(m^2). General: Well  developed, well nourished, in no acute distress. Head: Normocephalic, atraumatic, sclera non-icteric, no xanthomas, nares are without discharge.  Neck: Negative for carotid bruits. JVD not elevated. Lungs: Clear bilaterally to auscultation without wheezes, rales, or rhonchi. Breathing is unlabored. Heart: RRR with S1 S2. No murmurs, rubs, or gallops appreciated. Abdomen: Obese, soft, non-tender, non-distended with normoactive bowel sounds. No hepatomegaly. No rebound/guarding. No obvious abdominal masses. Msk:  Strength and tone appear normal for age. Extremities: No clubbing or cyanosis. 1+ bilateral pitting edema with chronic stasis.  Distal pedal pulses are 2+ and equal bilaterally. Neuro: Alert and oriented X 3. No facial asymmetry. No focal deficit. Moves all extremities spontaneously. Psych:  Responds to questions appropriately with a normal affect.    Assessment and Plan:   1. Acute respiratory failure with hypoxia and hypercarbia: -In the setting of recurrent RLL PNA and possibly NSTEMI in the setting of acute infection  -On BiPAP -He continues to refuse intubation, discussed with him -Wean O2 as able  2. NSTEMI: -Never with chest pain -Troponin has trended up to 32 at the time of cardiology consult -Not currently a cardiac cath candidate given patient's inability to lay supine 2/2 orthopnea, on BiPAP, CKD stage IV and refusal to go on dialysis (this was discussed with him), and worsening RLL PNA with procalitonin of 43 and up trending leukocytosis  -Continue heparin gtt at this time for 48-72 hours or until troponin down trends -Echo is pending to evaluate LV function and wall motion, preliminary report is ok LV function -Hypotension with SBP into the 70s-90s precludes initiation of beta blockers at this time -  Once his infection resolves he would still need a cardiac cath given the profound troponin elevation of 32 as this is unlikely to demand ischemia. He is likely to have  underlying coronary disease given his prior smoking history, he is a diabetic, and morbidly obese    3. Severe sepsis: -Secondary to RLL  -On Levaquin per IM -Nebs per IM -Consider steroids if indicated   4. Chronic diastolic CHF: -Hypotension precludes Lopressor at this time -Worsening renal function precludes continued usage of torsemide and metolazone at this time -Limit IV fluids  -Echo pending as above  5. Ectopic atrial tach: -No clearly defined P waves on ECG -On heparin gtt as above -Currently in sinus  6. Acute on CKD stage IV: -Meds on hold as above -Per Renal -Has has continued to decline dialysis which precludes cath as above  7. Leukocytosis/elevated procalcitonin: -Per #3  8. Hypokalemia: -Replete to 4.0  9. NSVT: -Hypotension precludes beta blocker -High risk of cardiac arrhythmia given the above -Should he develop increased ventricular ectopy consider short term antiarrhythmic    Signed, Eula Listenyan Yaelis Scharfenberg, PA-C Pager: [phone] 11/13/2015, 11:12 AM

## 2015-11-13 NOTE — Progress Notes (Signed)
CRITICAL VALUE ALERT  Critical value received:  Troponin 11.85  Date of notification:  11/13/15  Time of notification:  0140  Critical value read back: yes  Nurse who received alert:  Dutch QuintHiral Delicia Berens, RN  MD notified (1st page):  Hospitalist   Time of first page:  0145  Responding MD:  Dr. Hilton SinclairWeiting  Time MD responded:  [phone]

## 2015-11-13 NOTE — Care Management (Signed)
Patient continues to refused intubation, refuses foley but unable to void unless he is able to stand which he can not do.   Dr Sung AmabileSimonds states palliative care is not indicated at this time.

## 2015-11-13 NOTE — Telephone Encounter (Signed)
Patient wife Carl Perry call and stated her husband Carl Perry kept having multiple falls and getting sicker. So she finally had to call EMS on her husband and they admitted him last night, [facility]. His wife states his pneumonia is back and he has a lot of fluid. They are keeping him and monitoring him.

## 2015-11-13 NOTE — Consult Note (Signed)
Pharmacy Antibiotic Follow-up Note  Carl HoehnMichael E Perry is a 60 y.o. year-old male admitted on 12/05/2015.  The patient is currently on day 2 of levofloxacin for CAP.  Assessment/Plan: After discussion with Dr. Cherlynn KaiserSainani, BCID results were discussed and patient will be narrowed to Ceftriaxone 2 gm IV q24h and Azithromycin 500 mg IV q24h. Further narrowing will be determined based on finalized susceptibilities.   Discussion with MD included the addition of Azithromycin to cover for potential CAP pathogens.   Temp (24hrs), Avg:99 F (37.2 C), Min:97.8 F (36.6 C), Max:99.8 F (37.7 C)   Recent Labs Lab  1801 11/13/15 0314  WBC 26.1* 38.7*    Recent Labs Lab 11/15/2015 1801 11/13/15 0314  CREATININE 2.95* 3.24*   Estimated Creatinine Clearance: 32.6 mL/min (by C-G formula based on Cr of 3.24).    Allergies  Allergen Reactions  . Statins Swelling and Other (See Comments)    Reaction:  Rhabdomyolysis  . Ivp Dye [Iodinated Diagnostic Agents] Other (See Comments)    Reaction:  Unknown   . Penicillins Rash and Other (See Comments)    Unable to obtain enough information to answer additional questions about this medication.      Antimicrobials this admission: Levofloxacin  1/4>> 1/5 Ceftriaxone 1/5 >>  Azithromycin 1/5 >>  Aztreonam 2 gm IV once on 1/5   Microbiology results: 1/4 BCx: Strep pneumoniae  1/4 Sputum: pending 1/4 MRSA PCR: Negative  Thank you for allowing pharmacy to be a part of this patient's care.  Clarisa Schoolsrystal Karolee Meloni, PharmD Clinical Pharmacist 11/13/2015

## 2015-11-13 NOTE — Progress Notes (Signed)
MD/Gollan notified per MD Sung AmabileSimonds, updated by this RN Trop. 32.74, nursing will cont to monitor

## 2015-11-13 NOTE — Progress Notes (Signed)
*  PRELIMINARY RESULTS* Echocardiogram 2D Echocardiogram has been performed.  Carl Perry 11/13/2015, 12:09 PM

## 2015-11-13 NOTE — Progress Notes (Signed)
[facility] - [place] at [facility]   PATIENT NAME: Carl SheerMichael Perry    MR#:  [id number]  DATE OF BIRTH:  1955-11-11  SUBJECTIVE:   Patient here due to acute respiratory failure from pneumonia and also noted to be Septic.  Also noted to have elevated troponin consistent with a non-ST elevation MI. Currently chest pain-free. Family at bedside. Was noted to have urinary retention but has had a Foley catheter placed by urology. Remains on BiPAP  REVIEW OF SYSTEMS:    Review of Systems  Constitutional: Negative for fever and chills.  HENT: Negative for congestion and tinnitus.   Eyes: Negative for blurred vision and double vision.  Respiratory: Positive for cough, shortness of breath and wheezing.   Cardiovascular: Negative for chest pain, orthopnea and PND.  Gastrointestinal: Negative for nausea, vomiting, abdominal pain and diarrhea.  Genitourinary: Negative for dysuria and hematuria.  Neurological: Positive for weakness. Negative for dizziness, sensory change and focal weakness.  All other systems reviewed and are negative.   Nutrition: NPO Tolerating Diet: No Tolerating PT: Await Eval   DRUG ALLERGIES:   Allergies  Allergen Reactions  . Statins Swelling and Other (See Comments)    Reaction:  Rhabdomyolysis  . Ivp Dye [Iodinated Diagnostic Agents] Other (See Comments)    Reaction:  Unknown   . Penicillins Rash and Other (See Comments)    Unable to obtain enough information to answer additional questions about this medication.      VITALS:  Blood pressure 113/52, pulse 74, temperature 98.5 F (36.9 C), temperature source Axillary, resp. rate 35, height 5\' 9"  (1.753 m), weight 128.5 kg (283 lb 4.7 oz), SpO2 98 %.  PHYSICAL EXAMINATION:   Physical Exam  GENERAL:  60 y.o.-year-old obese patient lying in the bed in moderate resp. Distress EYES: Pupils equal, round, reactive to light and accommodation. No scleral icterus. Extraocular muscles intact.   HEENT: Head atraumatic, normocephalic. Oropharynx and nasopharynx clear.  NECK:  Supple, no jugular venous distention. No thyroid enlargement, no tenderness.  LUNGS: good a/e b/l, fused wheezing, rhonchi bilaterally. Positive use of accessory muscles of respiration.  CARDIOVASCULAR: S1, S2 tachycardic. No murmurs, rubs, or gallops.  ABDOMEN: Soft, nontender, nondistended. Bowel sounds present. No organomegaly or mass.  EXTREMITIES: No cyanosis, clubbing or +1-2 edema b/l.   NEUROLOGIC: Cranial nerves II through XII are intact. No focal Motor or sensory deficits b/l.  Globally weak PSYCHIATRIC: The patient is alert and oriented x 3.  SKIN: No obvious rash, lesion, or ulcer. Signs of chronic venous stasis bilaterally   LABORATORY PANEL:   CBC  Recent Labs Lab 11/13/15 0314  WBC 38.7*  HGB 9.7*  HCT 31.3*  PLT 427   ------------------------------------------------------------------------------------------------------------------  Chemistries   Recent Labs Lab 01/10/16 1801 11/13/15 0314  NA 138 138  K 3.2* 3.3*  CL 100* 99*  CO2 22 23  GLUCOSE 190* 121*  BUN 69* 70*  CREATININE 2.95* 3.24*  CALCIUM 9.1 9.1  MG 2.1  --   AST 18  --   ALT 11*  --   ALKPHOS 73  --   BILITOT 0.6  --    ------------------------------------------------------------------------------------------------------------------  Cardiac Enzymes  Recent Labs Lab 11/13/15 1008  TROPONINI 32.74*   ------------------------------------------------------------------------------------------------------------------  RADIOLOGY:  Dg Chest Portable 1 View  12/09/2015  CLINICAL DATA:  Difficulty breathing for several days. History of COPD. EXAM: PORTABLE CHEST 1 VIEW COMPARISON:  11/04/2015, 12 /6/16 FINDINGS: Normal cardiac silhouette. There is increased airspace density in the  RIGHT lower lobe. Low lung volumes. Upper lungs are clear. No pneumothorax. IMPRESSION: RIGHT lower lobe pneumonia has worsened  compared to 11/04/2015 and similar to 10/14/2015 suggesting recurrent pneumonia. Recommend follow-up CT to exclude underlying malignant lesion. Electronically Signed   By: Genevive Bi M.D.   On: 11/27/2015 18:36     ASSESSMENT AND PLAN:   60 year old male with past medical history of COPD, diabetes, hypertension, chronic kidney disease stage IV, obstructive sleep apnea, hyperlipidemia, morbid obesity, depression, chronic diastolic CHF, anemia of chronic disease who presented to the hospital due to shortness of breath and noted to be in acute respiratory failure.  #1 acute respiratory failure with hypoxia-this is due to underlying pneumonia. -Continue IV antibiotics with ceftriaxone, Zithromax. Continue BiPAP support. -Patient is at high risk for intubation and pulmonary following.  #2 pneumonia-suspect streptococcal pneumonia. Patient's blood cultures are positive for Streptococcus. -Continue IV ceftriaxone, Zithromax. Follow sputum cultures.  #3 non-ST elevation MI-patient has ruled in by cardiac markers. He is clinically asymptomatic though. -Patient likely needs a cardiac catheterization but is high risk given his chronic kidney disease and is not stable from the respiratory standpoint cardiac catheterization presently. -Continue heparin nomogram. Appreciate cardiology input, and continue medical management. Patient is not on beta blockers given his relative hypotension and sepsis.  #4 sepsis-secondary to pneumonia. -Continue IV ceftriaxone, Zithromax. Follow blood cultures which are consistent with streptococcus.  #5 acute on chronic renal failure-she has baseline CK disease stage IV. He denies dialysis. -Nephrology has been consulted and continue care as per them. If patient does need cardiac catheterization he is at high risk for proceeding to ESRD.  #6 hypokalemia-we'll continue to replace and repeat level in the morning.  #7 depression continue Elavil, Effexor.   #8 diabetes  type 2 without complication-continue sliding scale insulin.  #9 diabetic neuropathy-continue Neurontin.  All the records are reviewed and case discussed with Care Management/Social Workerr. Management plans discussed with the patient, family and they are in agreement.  CODE STATUS: Full   DVT Prophylaxis: Heparin gtt  TOTAL Critical Care TIME TAKING CARE OF THIS PATIENT: 35 minutes.   POSSIBLE D/C IN 2-3 DAYS, DEPENDING ON CLINICAL CONDITION.   Houston Siren M.D on 11/13/2015 at 4:36 PM  Between 7am to 6pm - Pager - [phone]  After 6pm go to [email or web address] - password [email or web address] [facility]  [place]  [facility]  Office  [phone]  CC: Primary care physician; Ruel Favors, MD

## 2015-11-13 NOTE — Consult Note (Signed)
   [facility]   11/13/2015  Haynes HoehnMichael E Mcquire Jun 16, 1956 [id number]   Made aware by [facility] Tele[facility] RNCM that patient is hospitalized. He has been followed closely by [facility] Tele[facility] RNCM and [facility] Licensed CSW in the community. Please see chart review tab then notes for further community [facility] correspondence/interaction with patient. Will send notification to inpatient RNCM that [facility] is following. Will also request for patient to be assigned to [facility] RN[facility].  Raiford NobleAtika Hall, MSN-Ed, RN,BSN [facility] Care Management Hospital Liaison [phone]

## 2015-11-13 NOTE — Progress Notes (Signed)
Pt placed on NRB to get a break. Pt tolerating well.

## 2015-11-13 NOTE — Consult Note (Signed)
@ENCDATE @ 12:42 PM   Carl Perry [birth year] [id number]  Referring provider: Dr. Joaquim Lai  Chief Complaint  Patient presents with  . Respiratory Distress    HPI: The patient is a 60 year old critically ill male in minimal pneumonia and MI was a difficult Foley catheter placement. Urology was consulted at bedside to place a catheter. The patient is in respiratory distress and unable to provide much of a history that we does note significant spraying of his stream. He apparently had a catheter placed in the past with trauma but is unclear exactly what type of injury the patient sustained.   PMH: Past Medical History  Diagnosis Date  . COPD (chronic obstructive pulmonary disease) (HCC)   . Diabetes mellitus without complication (HCC)   . Hypertension   . CKD (chronic kidney disease), stage IV (HCC)   . Sleep apnea   . Rhabdomyolysis   . Hyperlipidemia   . Morbid obesity (HCC)   . Depression   . Chronic diastolic CHF (congestive heart failure) (HCC)   . Allergy     Surgical History: Past Surgical History  Procedure Laterality Date  . Toe amputation Right   . Foot surgery Right   . Tonsillectomy    . Abdominal hernia repair  1989  . Cardiac catheterization      [facility] no stents    Home Medications:    Medication List    ASK your doctor about these medications        acetaminophen-codeine 300-30 MG tablet  Commonly known as:  TYLENOL #3  Take 1 tablet by mouth every 8 (eight) hours as needed for moderate pain.     amitriptyline 25 MG tablet  Commonly known as:  ELAVIL  Take 50 mg by mouth at bedtime.     aspirin EC 81 MG tablet  Take 81 mg by mouth daily.     baclofen 20 MG tablet  Commonly known as:  LIORESAL  Take 20 mg by mouth at bedtime as needed for muscle spasms.     docusate sodium 100 MG capsule  Commonly known as:  COLACE  Take 100 mg by mouth 2 (two) times daily as needed for mild constipation.     gabapentin 300 MG capsule  Commonly  known as:  NEURONTIN  Take 600 mg by mouth at bedtime.     gemfibrozil 600 MG tablet  Commonly known as:  LOPID  Take 600 mg by mouth 2 (two) times daily.     glipiZIDE 10 MG tablet  Commonly known as:  GLUCOTROL  Take 10 mg by mouth daily before breakfast.     insulin aspart 100 UNIT/ML injection  Commonly known as:  novoLOG  Inject into the skin 3 (three) times daily with meals as needed for high blood sugar. Pt uses as needed per sliding scale.     insulin detemir 100 UNIT/ML injection  Commonly known as:  LEVEMIR  Inject 45 Units into the skin at bedtime.     lactulose 10 GM/15ML solution  Commonly known as:  CHRONULAC  Take 15 mLs by mouth 2 (two) times daily.     Liraglutide 18 MG/3ML Sopn  Inject 1.8 mg into the skin daily.     metoCLOPramide 5 MG tablet  Commonly known as:  REGLAN  Take 1 tablet (5 mg total) by mouth daily as needed for nausea.     metolazone 5 MG tablet  Commonly known as:  ZAROXOLYN  Take 5 mg by mouth daily as  needed (for edema).     metoprolol tartrate 25 MG tablet  Commonly known as:  LOPRESSOR  Take 0.5 tablets (12.5 mg total) by mouth 2 (two) times daily.     potassium chloride SA 20 MEQ tablet  Commonly known as:  K-DUR,KLOR-CON  Take 20 mEq by mouth 2 (two) times daily.     torsemide 100 MG tablet  Commonly known as:  DEMADEX  Take 1 tablet (100 mg total) by mouth 2 (two) times daily.     venlafaxine XR 75 MG 24 hr capsule  Commonly known as:  EFFEXOR-XR  Take 3 capsules (225 mg total) by mouth daily.        Allergies:  Allergies  Allergen Reactions  . Statins Swelling and Other (See Comments)    Reaction:  Rhabdomyolysis  . Ivp Dye [Iodinated Diagnostic Agents] Other (See Comments)    Reaction:  Unknown   . Penicillins Rash and Other (See Comments)    Unable to obtain enough information to answer additional questions about this medication.      Family History: Family History  Problem Relation Age of Onset  . Cancer  Mother     Pancreatic  . Diabetes Sister   . Kidney disease Sister   . Diabetes Maternal Grandmother   . Heart disease Sister     Heart Failure  . Cancer Maternal Aunt   . Diabetes Sister   . Cancer Maternal Aunt   . Cancer Maternal Aunt     Social History:  reports that he quit smoking about 5 years ago. His smoking use included Cigarettes. He has a 70 pack-year smoking history. He has never used smokeless tobacco. He reports that he does not drink alcohol or use illicit drugs.  ROS: 12 point review of systems unable to obtain due to the patient's respiratory distress.                                        Physical Exam: BP 125/75 mmHg  Pulse 82  Temp(Src) 97.8 F (36.6 C) (Axillary)  Resp 32  Ht 5\' 9"  (1.753 m)  Wt 283 lb 4.7 oz (128.5 kg)  BMI 41.82 kg/m2  SpO2 100%  Constitutional:  Alert and oriented, No acute distress. HEENT: [place] AT, moist mucus membranes.  Trachea midline, no masses. Cardiovascular: No clubbing, cyanosis, or edema. Respiratory: Normal respiratory effort, no increased work of breathing. GI: Abdomen is soft, nontender, nondistended, no abdominal masses GU: No CVA tenderness. Meatal stenosis noted. Normal phallus otherwise. Testicles descended equally bilaterally. Skin: No rashes, bruises or suspicious lesions. Lymph: No cervical or inguinal adenopathy. Neurologic: Grossly intact, no focal deficits, moving all 4 extremities. Psychiatric: Normal mood and affect.  Laboratory Data: Lab Results  Component Value Date   WBC 38.7* 11/13/2015   HGB 9.7* 11/13/2015   HCT 31.3* 11/13/2015   MCV 76.0* 11/13/2015   PLT 427 11/13/2015    Lab Results  Component Value Date   CREATININE 3.24* 11/13/2015    No results found for: PSA  No results found for: TESTOSTERONE  Lab Results  Component Value Date   HGBA1C 5.2 11/13/2015    Urinalysis    Component Value Date/Time   COLORURINE Yellow 10/15/2014 1327   APPEARANCEUR  Clear 10/15/2014 1327   LABSPEC 1.009 10/15/2014 1327   PHURINE 7.0 10/15/2014 1327   GLUCOSEU 50 mg/dL 16/08/9603 5409   HGBUR 1+ 10/15/2014  1327   BILIRUBINUR Negative 10/15/2014 1327   KETONESUR Negative 10/15/2014 1327   PROTEINUR 100 mg/dL 16/10/960412/06/2014 [id number]   NITRITE Negative 10/15/2014 1327   LEUKOCYTESUR 1+ 10/15/2014 1327    The patient was prepped and draped in usual sterile fashion. Meatal stenosis was noted. Hemostat was used to dilate the meatal stenosis. A 14 French catheter was then easily placed with return of clear yellow urine  Assessment & Plan:    1. Difficult Foley placement A 14 French Foley catheter was placed after meatal dilation. Foley should be continued at least 7-10 days.  After that time, the catheter can be removed once the patient's ambulatory and out of the ICU.  2. Meatal Stenosis As above   Hildred LaserBrian James Remedy Corporan, MD  [facility] [address], [address] [place], [place] [address] [phone]

## 2015-11-13 NOTE — Progress Notes (Signed)
[facility]  ROUNDING NOTE   Subjective:   Admitted to ICU due to right lower lobe pneumonia. This is worse than previously treated pneumonia 1 week ago.  Chest pain and tightness: elevated cardiac enzymes.  Creatinine 3.24, baseline 2.69  Objective:  Vital signs in last 24 hours:  Temp:  [97.8 F (36.6 C)-99.8 F (37.7 C)] 97.8 F (36.6 C) (01/05 0405) Pulse Rate:  [39-122] 69 (01/05 0800) Resp:  [12-42] 23 (01/05 0800) BP: (78-151)/(14-128) 107/61 mmHg (01/05 0800) SpO2:  [78 %-100 %] 100 % (01/05 0800) FiO2 (%):  [100 %] 100 % (01/05 0756) Weight:  [120.657 kg (266 lb)-128.5 kg (283 lb 4.7 oz)] 128.5 kg (283 lb 4.7 oz) (01/04 2200)  Weight change:  Filed Weights   12/03/2015 1811 12/03/2015 2200  Weight: 120.657 kg (266 lb) 128.5 kg (283 lb 4.7 oz)    Intake/Output: I/O last 3 completed shifts: In: 115.5 [I.V.:15.5; IV Piggyback:100] Out: 100 [Urine:100]   Intake/Output this shift:     Physical Exam: General: Critically ill, in respiratory distress  Head: Normocephalic, atraumatic. Moist oral mucosal membranes  Eyes: Anicteric, PERRL  Neck: Supple, trachea midline  Lungs:  Bilateral wheezing  Heart: Regular rate and rhythm  Abdomen:  Soft, nontender, obese  Extremities: 1+ peripheral edema.  Neurologic: Nonfocal, moving all four extremities  Skin: No lesions  Access: none    Basic Metabolic Panel:  Recent Labs Lab 11/29/2015 1801 11/13/15 0314  NA 138 138  K 3.2* 3.3*  CL 100* 99*  CO2 22 23  GLUCOSE 190* 121*  BUN 69* 70*  CREATININE 2.95* 3.24*  CALCIUM 9.1 9.1  MG 2.1  --     Liver Function Tests:  Recent Labs Lab 11/19/2015 1801  AST 18  ALT 11*  ALKPHOS 73  BILITOT 0.6  PROT 8.9*  ALBUMIN 2.6*   No results for input(s): LIPASE, AMYLASE in the last 168 hours. No results for input(s): AMMONIA in the last 168 hours.  CBC:  Recent Labs Lab 11/24/2015 1801 11/13/15 0314  WBC 26.1* 38.7*  NEUTROABS 22.8*  --   HGB 10.6*  9.7*  HCT 34.0* 31.3*  MCV 77.4* 76.0*  PLT 488* 427    Cardiac Enzymes:  Recent Labs Lab 11/15/2015 1801 11/13/15 0033 11/13/15 0314  TROPONINI 1.01* 11.85* 18.90*    BNP: Invalid input(s): POCBNP  CBG:  Recent Labs Lab  2156 11/13/15 0120 11/13/15 0727  GLUCAP 124* 107* 119*    Microbiology: Results for orders placed or performed during the hospital encounter of 11/23/2015  MRSA PCR Screening     Status: None   Collection Time: 12/06/2015 10:01 PM  Result Value Ref Range Status   MRSA by PCR NEGATIVE NEGATIVE Final    Comment:        The GeneXpert MRSA Assay (FDA approved for NASAL specimens only), is one component of a comprehensive MRSA colonization surveillance program. It is not intended to diagnose MRSA infection nor to guide or monitor treatment for MRSA infections.   Culture, expectorated sputum-assessment     Status: None   Collection Time: 11/13/15  2:56 AM  Result Value Ref Range Status   Specimen Description SPUTUM  Final   Special Requests Normal  Final   Sputum evaluation   Final    Sputum specimen not acceptable for testing.  Please recollect.   CALLED BETH Pella AT [phone] 11/13/15.PMH   Report Status 11/13/2015 FINAL  Final    Coagulation Studies:  Recent Labs  11/13/15  0314  LABPROT 17.0*  INR 1.37    Urinalysis: No results for input(s): COLORURINE, LABSPEC, PHURINE, GLUCOSEU, HGBUR, BILIRUBINUR, KETONESUR, PROTEINUR, UROBILINOGEN, NITRITE, LEUKOCYTESUR in the last 72 hours.  Invalid input(s): APPERANCEUR    Imaging: Dg Chest Portable 1 View  11/19/2015  CLINICAL DATA:  Difficulty breathing for several days. History of COPD. EXAM: PORTABLE CHEST 1 VIEW COMPARISON:  11/04/2015, 12 /6/16 FINDINGS: Normal cardiac silhouette. There is increased airspace density in the RIGHT lower lobe. Low lung volumes. Upper lungs are clear. No pneumothorax. IMPRESSION: RIGHT lower lobe pneumonia has worsened compared to 11/04/2015 and similar to  10/14/2015 suggesting recurrent pneumonia. Recommend follow-up CT to exclude underlying malignant lesion. Electronically Signed   By: Suzy Bouchard M.D.   On: 12/08/2015 18:36     Medications:   . heparin 1,000 Units/hr (11/13/15 0427)   . amitriptyline  50 mg Oral QHS  . antiseptic oral rinse  7 mL Mouth Rinse q12n4p  . aspirin EC  81 mg Oral Daily  . chlorhexidine  15 mL Mouth Rinse BID  . gabapentin  600 mg Oral QHS  . gemfibrozil  600 mg Oral BID  . insulin aspart  0-15 Units Subcutaneous 6 times per day  . lactulose  10 g Oral BID  . sodium chloride  3 mL Intravenous Q12H  . sodium chloride  3 mL Intravenous Q12H  . venlafaxine XR  225 mg Oral Daily   sodium chloride, acetaminophen **OR** acetaminophen, acetaminophen-codeine, albuterol, baclofen, docusate sodium, ondansetron **OR** ondansetron (ZOFRAN) IV, sodium chloride  Assessment/ Plan:  Mr. Carl Perry is a 60 y.o. 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   1. Acute Renal Failure on Chronic kidney disease stage IV with nephrotic range proteinuria: baseline creatinine of 2.69, eGFR of 25, 10/21/2015 Acute renal failure from acute illness: acute cardiac syndrome, pneumonia, and hypotension. Concern for overdiuresis as well.  Chronic kidney disease secondary with nephrotic syndrome to long standing diabetes mellitus insulin dependent type II, hypertension and nonsteroidal use. Patient's creatinine fluctuates with fluid status due to cardiorenal syndrome. Not a candidate for a renal biopsy - patient has deferred dialysis access placement as recently as last month (12/13). Discussed dialysis and the risk of IV contrast for cardiac catheterization causing renal failure. At this time, patient states he is not mentally prepared  for dialysis.  - Currently off enalapril due to advanced renal disease and hyperkalemia. However today with hypokalemia.  - continue to hold diuretics.  - Monitor for dialysis need, monitor urine output, volume status and serum electrolytes. No acute indication for dialysis at this time.   2. Hypotension with Acute exacerbation of systolic/diastolic CHF with pulmonary edema and acute exacerbation of COPD. with history of anasarca/edema: Blood pressure improved.  - Holding torsemide and metolazone. Furosemide IV given in ED.  - Norepinephrine gtt.  - Continue low salt diet. Continue fluid restriction.   3. Pneumonia: afebrile. Wbc 38.7 with left shift.  - aztreonam and levofloxacin.  - will need a CT-chest.   4. Diabetes Mellitus type II insulin dependent with chronic kidney disease:  With nephrotic range proteinuria. Patient is unable to afford his insulin at times.  - Continue glucose control.   5. Secondary Hyperparathyroidism: PTH 86  On 12/13, Calcium and phosphorus at goal.  - not currently on an vitamin D agent.  5. Anemia of  chronic kidney disease: hemoglobin 9.7 - would not offer epo due to recent ischemia.     LOS: [place], [place] 1/5/20179:05 AM

## 2015-11-13 NOTE — Progress Notes (Signed)
eLink Physician-Brief Progress Note Patient Name: Carl Perry DOB: 14-Sep-1956 MRN: [id number]   Date of Service  11/13/2015  HPI/Events of Note  Camera check. Patient comfortable and watching TV. Reporting pain 8/10 in his legs. Takes tylenol 3 for pain in his legs.   eICU Interventions  Continue Tylenol 3 as ordered.     Intervention Category Intermediate Interventions: Pain - evaluation and management  Lawanda CousinsJennings Nash Bolls 11/13/2015, 3:26 PM

## 2015-11-13 NOTE — Progress Notes (Signed)
ANTICOAGULATION CONSULT NOTE - Initial Consult  Pharmacy Consult for heparin Indication: chest pain/ACS  Allergies  Allergen Reactions  . Statins Swelling and Other (See Comments)    Reaction:  Rhabdomyolysis  . Ivp Dye [Iodinated Diagnostic Agents] Other (See Comments)    Reaction:  Unknown   . Penicillins Rash and Other (See Comments)    Unable to obtain enough information to answer additional questions about this medication.      Patient Measurements: Height: 5\' 9"  (175.3 cm) Weight: 283 lb 4.7 oz (128.5 kg) IBW/kg (Calculated) : 70.7 Heparin Dosing Weight: 100.4 kg  Vital Signs: Temp: 99.8 F (37.7 C) (01/05 0000) Temp Source: Axillary (01/05 0000) BP: 93/57 mmHg (01/05 0100) Pulse Rate: 64 (01/05 0100)  Labs:  Recent Labs  11/25/2015 1801 11/13/15 0033  HGB 10.6*  --   HCT 34.0*  --   PLT 488*  --   CREATININE 2.95*  --   TROPONINI 1.01* 11.85*    Estimated Creatinine Clearance: 35.8 mL/min (by C-G formula based on Cr of 2.95).   Medical History: Past Medical History  Diagnosis Date  . COPD (chronic obstructive pulmonary disease) (HCC)   . Diabetes mellitus without complication (HCC)   . Hypertension   . CKD (chronic kidney disease)   . Sleep apnea   . Rhabdomyolysis   . Hyperlipidemia   . Morbid obesity (HCC)   . Depression   . CHF (congestive heart failure) (HCC)   . Allergy     Medications:  Infusions:  . heparin    . norepinephrine (LEVOPHED) Adult infusion Stopped (11/13/15 0000)    Assessment: 59 yom with respiratory failure and agitation, troponin positive. Pharmacy consulted to dose heparin for ACS. Received [facility] at 0120 will not bolus.   Goal of Therapy:  Heparin level 0.3-0.7 units/ml Monitor platelets by anticoagulation protocol: Yes   Plan:  Start heparin infusion at 1000 units/hr Check anti-Xa level in 6 hours and daily while on heparin Continue to monitor H&H and platelets  Carola FrostNathan A Reina Wilton, Pharm.D., BCPS Clinical  Pharmacist 11/13/2015,2:05 AM

## 2015-11-13 NOTE — Progress Notes (Signed)
ANTICOAGULATION CONSULT NOTE - Follow-up Consult  Pharmacy Consult for heparin Indication: chest pain/ACS  Allergies  Allergen Reactions  . Statins Swelling and Other (See Comments)    Reaction:  Rhabdomyolysis  . Ivp Dye [Iodinated Diagnostic Agents] Other (See Comments)    Reaction:  Unknown   . Penicillins Rash and Other (See Comments)    Unable to obtain enough information to answer additional questions about this medication.      Patient Measurements: Height: 5\' 9"  (175.3 cm) Weight: 283 lb 4.7 oz (128.5 kg) IBW/kg (Calculated) : 70.7 Heparin Dosing Weight: 100.4 kg  Vital Signs: Temp: 98.5 F (36.9 C) (01/05 1448) Temp Source: Axillary (01/05 1448) BP: 113/52 mmHg (01/05 1500) Pulse Rate: 74 (01/05 1500)  Labs:  Recent Labs  10-Jun-2016 1801 11/13/15 0033 11/13/15 0314 11/13/15 1008  HGB 10.6*  --  9.7*  --   HCT 34.0*  --  31.3*  --   PLT 488*  --  427  --   APTT  --   --  43*  --   LABPROT  --   --  17.0*  --   INR  --   --  1.37  --   HEPARINUNFRC  --   --   --  <0.10*  CREATININE 2.95*  --  3.24*  --   TROPONINI 1.01* 11.85* 18.90* 32.74*    Estimated Creatinine Clearance: 32.6 mL/min (by C-G formula based on Cr of 3.24).   Medical History: Past Medical History  Diagnosis Date  . COPD (chronic obstructive pulmonary disease) (HCC)   . Diabetes mellitus without complication (HCC)   . Hypertension   . CKD (chronic kidney disease), stage IV (HCC)   . Sleep apnea   . Rhabdomyolysis   . Hyperlipidemia   . Morbid obesity (HCC)   . Depression   . Chronic diastolic CHF (congestive heart failure) (HCC)   . Allergy     Medications:  Infusions:  . heparin 1,400 Units/hr (11/13/15 1331)    Assessment: 60 yo male with respiratory failure and agitation, troponin positive. Pharmacy consulted to dose heparin for ACS. Received [facility] at 0120 will not bolus. Patient initiated on heparin drip at 1000 units (10mL) per hour.    Goal of Therapy:  Heparin level  0.3-0.7 units/ml Monitor platelets by anticoagulation protocol: Yes   Plan:   Will order bolus of heparin 3000 units and increase drip to heparin 1400 units (14mL) per hour. Will obtain follow Anti-Xa level at 1930.   Pharmacy will continue to monitor and adjust per consult.    Felton Buczynski,Erinn L, Pharm.D. Clinical Pharmacist 11/13/2015,3:50 PM

## 2015-11-14 ENCOUNTER — Telehealth: Payer: Self-pay

## 2015-11-14 ENCOUNTER — Inpatient Hospital Stay: Payer: Commercial Managed Care - HMO

## 2015-11-14 DIAGNOSIS — I509 Heart failure, unspecified: Secondary | ICD-10-CM

## 2015-11-14 DIAGNOSIS — J9621 Acute and chronic respiratory failure with hypoxia: Secondary | ICD-10-CM

## 2015-11-14 LAB — GLUCOSE, CAPILLARY
GLUCOSE-CAPILLARY: 108 mg/dL — AB (ref 65–99)
GLUCOSE-CAPILLARY: 129 mg/dL — AB (ref 65–99)
GLUCOSE-CAPILLARY: 88 mg/dL (ref 65–99)
Glucose-Capillary: 107 mg/dL — ABNORMAL HIGH (ref 65–99)
Glucose-Capillary: 111 mg/dL — ABNORMAL HIGH (ref 65–99)
Glucose-Capillary: 46 mg/dL — ABNORMAL LOW (ref 65–99)
Glucose-Capillary: 49 mg/dL — ABNORMAL LOW (ref 65–99)
Glucose-Capillary: 54 mg/dL — ABNORMAL LOW (ref 65–99)
Glucose-Capillary: 71 mg/dL (ref 65–99)
Glucose-Capillary: 91 mg/dL (ref 65–99)

## 2015-11-14 LAB — BLOOD CULTURE ID PANEL (REFLEXED)
ACINETOBACTER BAUMANNII: NOT DETECTED
CANDIDA ALBICANS: NOT DETECTED
CANDIDA GLABRATA: NOT DETECTED
CANDIDA KRUSEI: NOT DETECTED
CANDIDA PARAPSILOSIS: NOT DETECTED
Candida tropicalis: NOT DETECTED
Carbapenem resistance: NOT DETECTED
ENTEROBACTER CLOACAE COMPLEX: NOT DETECTED
ENTEROBACTERIACEAE SPECIES: NOT DETECTED
ENTEROCOCCUS SPECIES: NOT DETECTED
ESCHERICHIA COLI: NOT DETECTED
Haemophilus influenzae: NOT DETECTED
KLEBSIELLA OXYTOCA: NOT DETECTED
Klebsiella pneumoniae: NOT DETECTED
LISTERIA MONOCYTOGENES: NOT DETECTED
METHICILLIN RESISTANCE: NOT DETECTED
Neisseria meningitidis: NOT DETECTED
PSEUDOMONAS AERUGINOSA: NOT DETECTED
Proteus species: NOT DETECTED
SERRATIA MARCESCENS: NOT DETECTED
STAPHYLOCOCCUS AUREUS BCID: NOT DETECTED
STREPTOCOCCUS AGALACTIAE: NOT DETECTED
STREPTOCOCCUS PYOGENES: NOT DETECTED
STREPTOCOCCUS SPECIES: DETECTED — AB
Staphylococcus species: NOT DETECTED
Streptococcus pneumoniae: DETECTED — AB
Vancomycin resistance: NOT DETECTED

## 2015-11-14 LAB — BASIC METABOLIC PANEL
Anion gap: 14 (ref 5–15)
BUN: 82 mg/dL — AB (ref 6–20)
CALCIUM: 8.9 mg/dL (ref 8.9–10.3)
CO2: 24 mmol/L (ref 22–32)
CREATININE: 3.35 mg/dL — AB (ref 0.61–1.24)
Chloride: 97 mmol/L — ABNORMAL LOW (ref 101–111)
GFR calc Af Amer: 22 mL/min — ABNORMAL LOW (ref >60)
GFR, EST NON AFRICAN AMERICAN: 19 mL/min — AB (ref >60)
Glucose, Bld: 86 mg/dL (ref 65–99)
POTASSIUM: 3 mmol/L — AB (ref 3.5–5.1)
SODIUM: 135 mmol/L (ref 135–145)

## 2015-11-14 LAB — TROPONIN I: Troponin I: 15.27 ng/mL — ABNORMAL HIGH (ref <0.031)

## 2015-11-14 LAB — CBC
HCT: 25.3 % — ABNORMAL LOW (ref 40.0–52.0)
Hemoglobin: 8.1 g/dL — ABNORMAL LOW (ref 13.0–18.0)
MCH: 23.9 pg — AB (ref 26.0–34.0)
MCHC: 32 g/dL (ref 32.0–36.0)
MCV: 75 fL — ABNORMAL LOW (ref 80.0–100.0)
PLATELETS: 381 10*3/uL (ref 150–440)
RBC: 3.37 MIL/uL — AB (ref 4.40–5.90)
RDW: 20 % — ABNORMAL HIGH (ref 11.5–14.5)
WBC: 17.2 10*3/uL — ABNORMAL HIGH (ref 3.8–10.6)

## 2015-11-14 LAB — HEPARIN LEVEL (UNFRACTIONATED)
HEPARIN UNFRACTIONATED: 0.11 [IU]/mL — AB (ref 0.30–0.70)
HEPARIN UNFRACTIONATED: 0.24 [IU]/mL — AB (ref 0.30–0.70)
Heparin Unfractionated: 0.3 IU/mL (ref 0.30–0.70)

## 2015-11-14 LAB — PROCALCITONIN: Procalcitonin: 41.25 ng/mL

## 2015-11-14 MED ORDER — HEPARIN BOLUS VIA INFUSION
3000.0000 [IU] | Freq: Once | INTRAVENOUS | Status: AC
  Administered 2015-11-14: 3000 [IU] via INTRAVENOUS
  Filled 2015-11-14: qty 3000

## 2015-11-14 MED ORDER — POTASSIUM CHLORIDE CRYS ER 20 MEQ PO TBCR
20.0000 meq | EXTENDED_RELEASE_TABLET | Freq: Once | ORAL | Status: AC
  Administered 2015-11-14: 20 meq via ORAL
  Filled 2015-11-14: qty 1

## 2015-11-14 MED ORDER — DEXTROSE 50 % IV SOLN
INTRAVENOUS | Status: AC
  Administered 2015-11-14: 25 mL via INTRAVENOUS
  Filled 2015-11-14: qty 50

## 2015-11-14 MED ORDER — HEPARIN BOLUS VIA INFUSION
1500.0000 [IU] | Freq: Once | INTRAVENOUS | Status: AC
  Administered 2015-11-14: 1500 [IU] via INTRAVENOUS
  Filled 2015-11-14: qty 1500

## 2015-11-14 MED ORDER — DEXTROSE 50 % IV SOLN
25.0000 mL | Freq: Once | INTRAVENOUS | Status: AC
  Administered 2015-11-14: 25 mL via INTRAVENOUS

## 2015-11-14 NOTE — Progress Notes (Signed)
[facility] -  at [facility]   PATIENT NAME: Carl Perry    MR#:  [id number]  DATE OF BIRTH:  12-Apr-1956  SUBJECTIVE:   Patient here due to acute respiratory failure from pneumonia and also noted to be Septic.  Also noted to have elevated troponin consistent with a non-ST elevation MI.  On Venti-mask this a.m. And was placed back on Bipap as he was having significant resp. Distress.  No other acute events overnight.   REVIEW OF SYSTEMS:    Review of Systems  Constitutional: Negative for fever and chills.  HENT: Negative for congestion and tinnitus.   Eyes: Negative for blurred vision and double vision.  Respiratory: Positive for cough, shortness of breath and wheezing.   Cardiovascular: Negative for chest pain, orthopnea and PND.  Gastrointestinal: Negative for nausea, vomiting, abdominal pain and diarrhea.  Genitourinary: Negative for dysuria and hematuria.  Neurological: Positive for weakness. Negative for dizziness, sensory change and focal weakness.  All other systems reviewed and are negative.   Nutrition: NPO Tolerating Diet: No Tolerating PT: Await Eval   DRUG ALLERGIES:   Allergies  Allergen Reactions  . Statins Swelling and Other (See Comments)    Reaction:  Rhabdomyolysis  . Ivp Dye [Iodinated Diagnostic Agents] Other (See Comments)    Reaction:  Unknown   . Penicillins Rash and Other (See Comments)    Unable to obtain enough information to answer additional questions about this medication.      VITALS:  Blood pressure 126/68, pulse 78, temperature 97.2 F (36.2 C), temperature source Axillary, resp. rate 29, height 5\' 9"  (1.753 m), weight 128.5 kg (283 lb 4.7 oz), SpO2 97 %.  PHYSICAL EXAMINATION:   Physical Exam  GENERAL:  60 y.o.-year-old obese patient lying in the bed in moderate resp. Distress EYES: Pupils equal, round, reactive to light and accommodation. No scleral icterus. Extraocular muscles intact.  HEENT: Head  atraumatic, normocephalic. Oropharynx and nasopharynx clear.  NECK:  Supple, no jugular venous distention. No thyroid enlargement, no tenderness.  LUNGS: good a/e b/l, diffuse wheezing, rhonchi bilaterally. Positive use of accessory muscles of respiration.  CARDIOVASCULAR: S1, S2 tachycardic. No murmurs, rubs, or gallops.  ABDOMEN: Soft, nontender, nondistended. Bowel sounds present. No organomegaly or mass.  EXTREMITIES: No cyanosis, clubbing or +1-2 edema b/l.   NEUROLOGIC: Cranial nerves II through XII are intact. No focal Motor or sensory deficits b/l.  Globally weak PSYCHIATRIC: The patient is alert and oriented x 3.  SKIN: No obvious rash, lesion, or ulcer. Signs of chronic venous stasis bilaterally   LABORATORY PANEL:   CBC  Recent Labs Lab 11/14/15 0322  WBC 17.2*  HGB 8.1*  HCT 25.3*  PLT 381   ------------------------------------------------------------------------------------------------------------------  Chemistries   Recent Labs Lab 11/20/2015 1801  11/14/15 0322  NA 138  < > 135  K 3.2*  < > 3.0*  CL 100*  < > 97*  CO2 22  < > 24  GLUCOSE 190*  < > 86  BUN 69*  < > 82*  CREATININE 2.95*  < > 3.35*  CALCIUM 9.1  < > 8.9  MG 2.1  --   --   AST 18  --   --   ALT 11*  --   --   ALKPHOS 73  --   --   BILITOT 0.6  --   --   < > = values in this interval not displayed. ------------------------------------------------------------------------------------------------------------------  Cardiac Enzymes  Recent Labs Lab 11/14/15 1050  TROPONINI 15.27*   ------------------------------------------------------------------------------------------------------------------  RADIOLOGY:  Dg Chest Port 1 View  11/14/2015  CLINICAL DATA:  Respiratory failure. EXAM: PORTABLE CHEST 1 VIEW COMPARISON:  12/01/2015 . FINDINGS: Mediastinum hilar structures normal. Cardiomegaly with mild pulmonary vascular prominence noted. Low lung volumes with prominent bibasilar atelectasis  and/or infiltrates/pulmonary edema, right side greater than left. Findings most likely related to basilar pneumonia particularly on the right. Small right pleural effusion. Similar findings noted on prior exam . IMPRESSION: 1. Cardiomegaly with pulmonary vascular congestion. 2. Low lung volumes with prominent bibasilar atelectasis and/or infiltrates/pulmonary edema, right side greater than left. Findings most likely related to pneumonia particularly in the right lung base. Small right pleural effusion. Similar findings noted on prior exam . Electronically Signed   By: Maisie Fus  Register   On: 11/14/2015 07:55   Dg Chest Portable 1 View  11/25/2015  CLINICAL DATA:  Difficulty breathing for several days. History of COPD. EXAM: PORTABLE CHEST 1 VIEW COMPARISON:  11/04/2015, 12 /6/16 FINDINGS: Normal cardiac silhouette. There is increased airspace density in the RIGHT lower lobe. Low lung volumes. Upper lungs are clear. No pneumothorax. IMPRESSION: RIGHT lower lobe pneumonia has worsened compared to 11/04/2015 and similar to 10/14/2015 suggesting recurrent pneumonia. Recommend follow-up CT to exclude underlying malignant lesion. Electronically Signed   By: Genevive Bi M.D.   On: 11/26/2015 18:36     ASSESSMENT AND PLAN:   60 year old male with past medical history of COPD, diabetes, hypertension, chronic kidney disease stage IV, obstructive sleep apnea, hyperlipidemia, morbid obesity, depression, chronic diastolic CHF, anemia of chronic disease who presented to the hospital due to shortness of breath and noted to be in acute respiratory failure.  #1 acute respiratory failure with hypoxia-this is due to underlying pneumonia. -Continue IV antibiotics with ceftriaxone, Zithromax. Continue BiPAP support. -Patient is at high risk for intubation but does not want to be intubated.  - Pulm following.    #2 pneumonia-suspect streptococcal pneumonia. Patient's blood cultures are positive for  Streptococcus. -Continue IV ceftriaxone, Zithromax. Follow sputum cultures.  #3 non-ST elevation MI-patient has ruled in by cardiac markers. He remains clinically asymptomatic though. -Patient likely needs a cardiac catheterization but is high risk given his chronic kidney disease and is not stable from the respiratory standpoint cardiac catheterization presently. -Continue ASA,  heparin nomogram. Appreciate cardiology input, and continue medical management. Patient is not on beta blockers given his relative hypotension and sepsis.  #4 sepsis-secondary to pneumonia. -Continue IV ceftriaxone, Zithromax. Follow blood cultures which are growing Streptococcus.  #5 acute on chronic renal failure-he has baseline CK disease stage IV. He does not want dialysis. - Baseline Cr. 2.6 in 12/16 and today 3.3.  Likely Cardiorenal in nature given his NSTEMI.  Urine output is good at 1400 ml/24 hrs.   -Nephrology has been consulted and continue care as per them.  #6 hypokalemia-we'll continue to supplement and repeat level in the morning.  #7 depression - continue Elavil, Effexor.   #8 diabetes type 2 without complication-continue sliding scale insulin.  #9 diabetic neuropathy-continue Neurontin.  All the records are reviewed and case discussed with Care Management/Social Workerr. Management plans discussed with the patient, family and they are in agreement.  CODE STATUS: Full   DVT Prophylaxis: Heparin gtt  TOTAL Critical Care TIME TAKING CARE OF THIS PATIENT: 30 minutes.   POSSIBLE D/C IN 2-3 DAYS, DEPENDING ON CLINICAL CONDITION.   Houston Siren M.D on 11/14/2015 at 4:14 PM  Between 7am to 6pm - Pager - [phone]  After 6pm go to [email or web address] - password [email or web address] [facility]  [facility]  Office  [phone]  CC: Primary care physician; Ruel FavorsKrichna F Sowles, MD

## 2015-11-14 NOTE — Progress Notes (Signed)
ANTICOAGULATION CONSULT NOTE - Follow-up Consult  Pharmacy Consult for heparin Indication: chest pain/ACS  Allergies  Allergen Reactions  . Statins Swelling and Other (See Comments)    Reaction:  Rhabdomyolysis  . Ivp Dye [Iodinated Diagnostic Agents] Other (See Comments)    Reaction:  Unknown   . Penicillins Rash and Other (See Comments)    Unable to obtain enough information to answer additional questions about this medication.      Patient Measurements: Height: 5\' 9"  (175.3 cm) Weight: 283 lb 4.7 oz (128.5 kg) IBW/kg (Calculated) : 70.7 Heparin Dosing Weight: 100.4 kg  Vital Signs: Temp: 97.5 F (36.4 C) (01/06 0800) Temp Source: Axillary (01/06 0800) BP: 136/86 mmHg (01/06 1000) Pulse Rate: 90 (01/06 1000)  Labs:  Recent Labs  11-20-15 1801 11/13/15 0033 11/13/15 0314  11/13/15 1008 11/13/15 1928 11/14/15 0322 11/14/15 1050  HGB 10.6*  --  9.7*  --   --   --  8.1*  --   HCT 34.0*  --  31.3*  --   --   --  25.3*  --   PLT 488*  --  427  --   --   --  381  --   APTT  --   --  43*  --   --   --   --   --   LABPROT  --   --  17.0*  --   --   --   --   --   INR  --   --  1.37  --   --   --   --   --   HEPARINUNFRC  --   --   --   < > <0.10* <0.10* 0.11* 0.30  CREATININE 2.95*  --  3.24*  --   --   --  3.35*  --   TROPONINI 1.01* 11.85* 18.90*  --  32.74*  --   --   --   < > = values in this interval not displayed.  Estimated Creatinine Clearance: 31.5 mL/min (by C-G formula based on Cr of 3.35).   Medical History: Past Medical History  Diagnosis Date  . COPD (chronic obstructive pulmonary disease) (HCC)     a. on intermittent home O2  . Diabetes mellitus (HCC)   . Hypertension   . CKD (chronic kidney disease), stage IV (HCC)   . Sleep apnea   . Rhabdomyolysis   . Hyperlipidemia   . Morbid obesity (HCC)   . Depression   . Chronic diastolic CHF (congestive heart failure) (HCC)     a. echo 11/2013: EF 60-65%, mild LVH, mildly increased LV internal cavity  size, moderately increased LV posterior wall thickness; b. echo 1/17: EF 45-50%, severe HK of apical, lateral, & anterolateral myocardium, technically difficult images other regions of WMA cannot be excluded, GR1DD, mild MR, LA mild to mod dilated, PASP nl  . Allergy   . Chronic liver failure (HCC)   . Anemia of chronic disease   . Polysubstance abuse     a. prior abuse of benzos and acid  . PNA (pneumonia)     a. recurrent PNA 10/2015 and 11/2015 leading to acute respiratory failure    Medications:  Infusions:  . heparin 2,200 Units/hr (11/14/15 0700)    Assessment: 60 yo male with respiratory failure and agitation, troponin positive. Pharmacy consulted to dose heparin for ACS. Received [facility] at 0120 will not bolus. Patient initiated on heparin drip at 1000 units (10mL) per hour.  Goal of Therapy:  Heparin level 0.3-0.7 units/ml Monitor platelets by anticoagulation protocol: Yes   Plan:  Heparin level is at goal but at the very low end of goal range so will increase heparin infusion to 2300 units/hr and check another HL in 6 hours.   Pharmacy will continue to monitor and adjust per consult.    Luisa Harthristy, Kimiko Common D, Pharm.D., BCPS Clinical Pharmacist 11/14/2015,11:38 AM

## 2015-11-14 NOTE — Progress Notes (Signed)
Inpatient Diabetes Program Recommendations  AACE/ADA: New Consensus Statement on Inpatient Glycemic Control (2015)  Target Ranges:  Prepandial:   less than 140 mg/dL      Peak postprandial:   less than 180 mg/dL (1-2 hours)      Critically ill patients:  140 - 180 mg/dL   Review of Glycemic Control  Results for Carl Perry, Corbet E (MRN [id number]) as of 11/14/2015 11:39  Ref. Range 11/14/2015 07:41 11/14/2015 07:43 11/14/2015 08:04 11/14/2015 08:26 11/14/2015 11:09  Glucose-Capillary Latest Ref Range: 65-99 mg/dL 49 (L) 46 (L) 54 (L) 88 91    Diabetes history: Type 2 Outpatient Diabetes medications: Glucotrol 10mg  qam, Novolog tid, Levemir 45 units qhs Current orders for Inpatient glycemic control: none  Inpatient Diabetes Program Recommendations: Noted low blood sugar- agree with discontinuation of Novolog insulin but consider checking blood sugars q4h to evaluate future needs.   Susette RacerJulie Nitesh Pitstick, RN, BA, MHA, CDE Diabetes Coordinator Inpatient Diabetes Program  [phone] (Team Pager) [phone] [facility] Office) 11/14/2015 11:38 AM

## 2015-11-14 NOTE — Telephone Encounter (Signed)
Patient wife Carl Perry call and stated Dr. Mariah MillingGollan Cardiologist came to visit Carl Perry and stated he had a heart attack on Wednesday. He is wanting to do a ultrasound to see the extend of the damage from the heart attack and then determine if they should do a catheterization. Wife states his pneumonia has improved but he is still in ICU.

## 2015-11-14 NOTE — Progress Notes (Signed)
RN spoke with MD, Dr. Sung AmabileSimonds, in person about patient's CBGs throughout night and morning. MD acknowledged and stated he would change patient's diet order to allow for liquids and okayed regular sodas and juice due to low CBGs.

## 2015-11-14 NOTE — Progress Notes (Signed)
[facility]  ROUNDING NOTE   Subjective:   Resting comfortably on bipap Blood cultures with strep Creatinine 3.35 (3.24) UOP 1475  Objective:  Vital signs in last 24 hours:  Temp:  [97.5 F (36.4 C)-98.7 F (37.1 C)] 97.5 F (36.4 C) (01/06 0800) Pulse Rate:  [64-84] 81 (01/06 0900) Resp:  [17-35] 32 (01/06 0900) BP: (102-138)/(52-99) 138/81 mmHg (01/06 0900) SpO2:  [91 %-100 %] 100 % (01/06 0900) FiO2 (%):  [40 %-100 %] 100 % (01/06 0855)  Weight change:  Filed Weights   12/05/2015 1811 11/09/2015 2200  Weight: 120.657 kg (266 lb) 128.5 kg (283 lb 4.7 oz)    Intake/Output: I/O last 3 completed shifts: In: 2288.9 [P.O.:1800; I.V.:388.9; IV Piggyback:100] Out: 4665 [Urine:1575]   Intake/Output this shift:  Total I/O In: 142 [P.O.:120; I.V.:22] Out: -   Physical Exam: General: Critically ill, in respiratory distress  Head: Normocephalic, atraumatic. Moist oral mucosal membranes  Eyes: Anicteric, PERRL  Neck: Supple, trachea midline  Lungs:  Bilateral wheezing, +BiPap FiO2 40%  Heart: Regular rate and rhythm  Abdomen:  Soft, nontender, obese  Extremities: 1+ peripheral edema.  Neurologic: Nonfocal, moving all four extremities  Skin: No lesions  Access: none    Basic Metabolic Panel:  Recent Labs Lab 11/13/2015 1801 11/13/15 0314 11/14/15 0322  NA 138 138 135  K 3.2* 3.3* 3.0*  CL 100* 99* 97*  CO2 '22 23 24  '$ GLUCOSE 190* 121* 86  BUN 69* 70* 82*  CREATININE 2.95* 3.24* 3.35*  CALCIUM 9.1 9.1 8.9  MG 2.1  --   --     Liver Function Tests:  Recent Labs Lab 11/24/2015 1801  AST 18  ALT 11*  ALKPHOS 73  BILITOT 0.6  PROT 8.9*  ALBUMIN 2.6*   No results for input(s): LIPASE, AMYLASE in the last 168 hours. No results for input(s): AMMONIA in the last 168 hours.  CBC:  Recent Labs Lab 11/24/2015 1801 11/13/15 0314 11/14/15 0322  WBC 26.1* 38.7* 17.2*  NEUTROABS 22.8*  --   --   HGB 10.6* 9.7* 8.1*  HCT 34.0* 31.3* 25.3*  MCV  77.4* 76.0* 75.0*  PLT 488* 427 381    Cardiac Enzymes:  Recent Labs Lab 11/14/2015 1801 11/13/15 0033 11/13/15 0314 11/13/15 1008  TROPONINI 1.01* 11.85* 18.90* 32.74*    BNP: Invalid input(s): POCBNP  CBG:  Recent Labs Lab 11/14/15 0419 11/14/15 0741 11/14/15 0743 11/14/15 0804 11/14/15 0826  GLUCAP 129* 49* 46* 23* 87    Microbiology: Results for orders placed or performed during the hospital encounter of 11/20/2015  Blood culture (routine x 2)     Status: None (Preliminary result)   Collection Time: 11/13/2015  6:01 PM  Result Value Ref Range Status   Specimen Description BLOOD LEFT HAND  Final   Special Requests BOTTLES DRAWN AEROBIC AND ANAEROBIC 4CC  Final   Culture  Setup Time   Final    GRAM POSITIVE COCCI IN PAIRS IN BOTH AEROBIC AND ANAEROBIC BOTTLES CRITICAL VALUE NOTED.  VALUE IS CONSISTENT WITH PREVIOUSLY REPORTED AND CALLED VALUE.    Culture   Final    GRAM POSITIVE COCCI IN PAIRS IN BOTH AEROBIC AND ANAEROBIC BOTTLES IDENTIFICATION AND SUSCEPTIBILITIES TO FOLLOW    Report Status PENDING  Incomplete  Blood culture (routine x 2)     Status: None (Preliminary result)   Collection Time: 11/11/2015  6:07 PM  Result Value Ref Range Status   Specimen Description BLOOD LEFT ASSIST CONTROL  Final  Special Requests BOTTLES DRAWN AEROBIC AND ANAEROBIC 7CC  Final   Culture  Setup Time   Final    GRAM POSITIVE COCCI IN PAIRS IN BOTH AEROBIC AND ANAEROBIC BOTTLES CRITICAL RESULT CALLED TO, READ BACK BY AND VERIFIED WITH: HANK ZOMPA AT 1220 ON 11/23/15 CTJ    Culture   Final    STREPTOCOCCUS PNEUMONIAE IN BOTH AEROBIC AND ANAEROBIC BOTTLES SUSCEPTIBILITIES TO FOLLOW    Report Status PENDING  Incomplete  Blood Culture ID Panel (Reflexed)     Status: Abnormal   Collection Time: 12/09/2015  6:07 PM  Result Value Ref Range Status   Enterococcus species NOT DETECTED NOT DETECTED Final   Listeria monocytogenes NOT DETECTED NOT DETECTED Final   Staphylococcus species  NOT DETECTED NOT DETECTED Final   Staphylococcus aureus NOT DETECTED NOT DETECTED Final   Streptococcus species DETECTED (A) NOT DETECTED Corrected    Comment: CRITICAL RESULT CALLED TO, READ BACK BY AND VERIFIED WITH: HANK ZOMPA AT 1220 ON 11/13/15 CTJ CORRECTED ON 01/06 AT 2493: PREVIOUSLY REPORTED AS DETECTED    Streptococcus agalactiae NOT DETECTED NOT DETECTED Final   Streptococcus pneumoniae DETECTED (A) NOT DETECTED Final    Comment: CRITICAL RESULT CALLED TO, READ BACK BY AND VERIFIED WITH: HANK ZOMPA AT 1220 ON 11/13/15 CTJ    Streptococcus pyogenes NOT DETECTED NOT DETECTED Final   Acinetobacter baumannii NOT DETECTED NOT DETECTED Final   Enterobacteriaceae species NOT DETECTED NOT DETECTED Final   Enterobacter cloacae complex NOT DETECTED NOT DETECTED Final   Escherichia coli NOT DETECTED NOT DETECTED Final   Klebsiella oxytoca NOT DETECTED NOT DETECTED Final   Klebsiella pneumoniae NOT DETECTED NOT DETECTED Final   Proteus species NOT DETECTED NOT DETECTED Final   Serratia marcescens NOT DETECTED NOT DETECTED Final   Haemophilus influenzae NOT DETECTED NOT DETECTED Final   Neisseria meningitidis NOT DETECTED NOT DETECTED Final   Pseudomonas aeruginosa NOT DETECTED NOT DETECTED Final   Candida albicans NOT DETECTED NOT DETECTED Final   Candida glabrata NOT DETECTED NOT DETECTED Final   Candida krusei NOT DETECTED NOT DETECTED Final   Candida parapsilosis NOT DETECTED NOT DETECTED Final   Candida tropicalis NOT DETECTED NOT DETECTED Final   Carbapenem resistance NOT DETECTED NOT DETECTED Final   Methicillin resistance NOT DETECTED NOT DETECTED Final   Vancomycin resistance NOT DETECTED NOT DETECTED Final  MRSA PCR Screening     Status: None   Collection Time: 11/16/2015 10:01 PM  Result Value Ref Range Status   MRSA by PCR NEGATIVE NEGATIVE Final    Comment:        The GeneXpert MRSA Assay (FDA approved for NASAL specimens only), is one component of a comprehensive MRSA  colonization surveillance program. It is not intended to diagnose MRSA infection nor to guide or monitor treatment for MRSA infections.   Culture, expectorated sputum-assessment     Status: None   Collection Time: 11/13/15  2:56 AM  Result Value Ref Range Status   Specimen Description SPUTUM  Final   Special Requests Normal  Final   Sputum evaluation   Final    Sputum specimen not acceptable for testing.  Please recollect.   CALLED BETH BUONO AT 2419 11/13/15.PMH   Report Status 11/13/2015 FINAL  Final    Coagulation Studies:  Recent Labs  11/13/15 0314  LABPROT 17.0*  INR 1.37    Urinalysis: No results for input(s): COLORURINE, LABSPEC, PHURINE, GLUCOSEU, HGBUR, BILIRUBINUR, KETONESUR, PROTEINUR, UROBILINOGEN, NITRITE, LEUKOCYTESUR in the last 72 hours.  Invalid  input(s): APPERANCEUR    Imaging: Dg Chest Port 1 View  11/14/2015  CLINICAL DATA:  Respiratory failure. EXAM: PORTABLE CHEST 1 VIEW COMPARISON:  12/05/2015 . FINDINGS: Mediastinum hilar structures normal. Cardiomegaly with mild pulmonary vascular prominence noted. Low lung volumes with prominent bibasilar atelectasis and/or infiltrates/pulmonary edema, right side greater than left. Findings most likely related to basilar pneumonia particularly on the right. Small right pleural effusion. Similar findings noted on prior exam . IMPRESSION: 1. Cardiomegaly with pulmonary vascular congestion. 2. Low lung volumes with prominent bibasilar atelectasis and/or infiltrates/pulmonary edema, right side greater than left. Findings most likely related to pneumonia particularly in the right lung base. Small right pleural effusion. Similar findings noted on prior exam . Electronically Signed   By: Bowie   On: 11/14/2015 07:55   Dg Chest Portable 1 View  11/09/2015  CLINICAL DATA:  Difficulty breathing for several days. History of COPD. EXAM: PORTABLE CHEST 1 VIEW COMPARISON:  11/04/2015, 12 /6/16 FINDINGS: Normal cardiac  silhouette. There is increased airspace density in the RIGHT lower lobe. Low lung volumes. Upper lungs are clear. No pneumothorax. IMPRESSION: RIGHT lower lobe pneumonia has worsened compared to 11/04/2015 and similar to 10/14/2015 suggesting recurrent pneumonia. Recommend follow-up CT to exclude underlying malignant lesion. Electronically Signed   By: Suzy Bouchard M.D.   On: 11/30/2015 18:36     Medications:   . heparin 2,200 Units/hr (11/14/15 0700)   . amitriptyline  50 mg Oral QHS  . antiseptic oral rinse  7 mL Mouth Rinse q12n4p  . aspirin EC  81 mg Oral Daily  . azithromycin  500 mg Intravenous Q24H  . cefTRIAXone (ROCEPHIN)  IV  2 g Intravenous Q24H  . chlorhexidine  15 mL Mouth Rinse BID  . gabapentin  600 mg Oral QHS  . gemfibrozil  600 mg Oral BID  . insulin aspart  0-15 Units Subcutaneous 6 times per day  . lactulose  10 g Oral BID  . sodium chloride  3 mL Intravenous Q12H  . sodium chloride  3 mL Intravenous Q12H  . venlafaxine XR  225 mg Oral Daily   sodium chloride, acetaminophen **OR** acetaminophen, acetaminophen-codeine, albuterol, baclofen, docusate sodium, ondansetron **OR** ondansetron (ZOFRAN) IV, sodium chloride, sodium chloride  Assessment/ Plan:  Mr. EAN Carl Perry is a 60 y.o. 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   1. Acute Renal Failure on Chronic kidney disease stage IV with nephrotic range proteinuria: baseline creatinine of 2.69, eGFR of 25, 10/21/2015 Acute renal failure from acute illness: acute cardiac syndrome, pneumonia, and hypotension. Doubt this is post strep glomerulonephritis.   Chronic kidney disease secondary with nephrotic syndrome to long standing diabetes mellitus insulin dependent type II, hypertension and nonsteroidal use. Patient's  creatinine fluctuates with fluid status due to cardiorenal syndrome. Not a candidate for a renal biopsy - patient has deferred dialysis access placement as recently as last month (12/13). Discussed dialysis and the risk of IV contrast for cardiac catheterization causing renal failure. At this time, patient states he is not mentally prepared for dialysis.  - Currently off enalapril due to advanced renal disease and hyperkalemia. However now with hypokalemia.  - continue to hold diuretics.  - Monitor for dialysis need, monitor urine output, volume status and serum electrolytes. No acute indication for dialysis at this time.  - Discussed case with Cardiology, Dr. Rockey Situ  2. Hypotension with Acute exacerbation of systolic/diastolic CHF with pulmonary edema and acute exacerbation of COPD. with history of anasarca/edema: Blood pressure improved.  - Holding torsemide and metolazone. Furosemide IV given in ED.  - Norepinephrine gtt turned off  - Continue low salt diet. Continue fluid restriction.   3. Pneumonia: afebrile. Wbc improved.  - aztreonam and ceftriaxone.  - will need a CT-chest when more stable.   4. Diabetes Mellitus type II insulin dependent with chronic kidney disease:  With nephrotic range proteinuria. Patient is unable to afford his insulin at times.  - Continue glucose control.   5. Secondary Hyperparathyroidism: PTH 86  On 12/13, Calcium and phosphorus at goal.  - not currently on an vitamin D agent.  5. Anemia of chronic kidney disease: hemoglobin 8.1 - would not offer epo due to recent ischemia.  - low threshold for PRBC transfusion with recent cardiac ischemia.    LOS: 2 Jakorian Marengo 1/6/20179:51 AM

## 2015-11-14 NOTE — Progress Notes (Addendum)
ANTICOAGULATION CONSULT NOTE - Follow-up Consult  Pharmacy Consult for heparin Indication: chest pain/ACS  Allergies  Allergen Reactions  . Statins Swelling and Other (See Comments)    Reaction:  Rhabdomyolysis  . Ivp Dye [Iodinated Diagnostic Agents] Other (See Comments)    Reaction:  Unknown   . Penicillins Rash and Other (See Comments)    Unable to obtain enough information to answer additional questions about this medication.      Patient Measurements: Height: 5\' 9"  (175.3 cm) Weight: 283 lb 4.7 oz (128.5 kg) IBW/kg (Calculated) : 70.7 Heparin Dosing Weight: 100.4 kg  Vital Signs: Temp: 97.2 F (36.2 C) (01/06 1547) Temp Source: Axillary (01/06 1547) BP: 129/75 mmHg (01/06 1700) Pulse Rate: 78 (01/06 1700)  Labs:  Recent Labs  12/09/2015 1801  11/13/15 0314 11/13/15 1008  11/14/15 0322 11/14/15 1050 11/14/15 1819  HGB 10.6*  --  9.7*  --   --  8.1*  --   --   HCT 34.0*  --  31.3*  --   --  25.3*  --   --   PLT 488*  --  427  --   --  381  --   --   APTT  --   --  43*  --   --   --   --   --   LABPROT  --   --  17.0*  --   --   --   --   --   INR  --   --  1.37  --   --   --   --   --   HEPARINUNFRC  --   --   --  <0.10*  < > 0.11* 0.30 0.24*  CREATININE 2.95*  --  3.24*  --   --  3.35*  --   --   TROPONINI 1.01*  < > 18.90* 32.74*  --   --  15.27*  --   < > = values in this interval not displayed.  Estimated Creatinine Clearance: 31.5 mL/min (by C-G formula based on Cr of 3.35).   Medical History: Past Medical History  Diagnosis Date  . COPD (chronic obstructive pulmonary disease) (HCC)     a. on intermittent home O2  . Diabetes mellitus (HCC)   . Hypertension   . CKD (chronic kidney disease), stage IV (HCC)   . Sleep apnea   . Rhabdomyolysis   . Hyperlipidemia   . Morbid obesity (HCC)   . Depression   . Chronic diastolic CHF (congestive heart failure) (HCC)     a. echo 11/2013: EF 60-65%, mild LVH, mildly increased LV internal cavity size,  moderately increased LV posterior wall thickness; b. echo 1/17: EF 45-50%, severe HK of apical, lateral, & anterolateral myocardium, technically difficult images other regions of WMA cannot be excluded, GR1DD, mild MR, LA mild to mod dilated, PASP nl  . Allergy   . Chronic liver failure (HCC)   . Anemia of chronic disease   . Polysubstance abuse     a. prior abuse of benzos and acid  . PNA (pneumonia)     a. recurrent PNA 10/2015 and 11/2015 leading to acute respiratory failure    Medications:  Infusions:  . heparin 2,300 Units/hr (11/14/15 1209)    Assessment: 60 yo male with respiratory failure and agitation, troponin positive. Pharmacy consulted to dose heparin for ACS. Received [facility] at 0120 will not bolus. Patient initiated on heparin drip at 1000 units (10mL) per hour.  Goal of Therapy:  Heparin level 0.3-0.7 units/ml Monitor platelets by anticoagulation protocol: Yes   Plan:  Heparin level is at goal but at the very low end of goal range so will increase heparin infusion to 2300 units/hr and check another HL in 6 hours.   1/6:  HL @ 18:00 = 0.24 Will order Heparin 1500 units IV X 1 bolus and increase drip rate to 2500 units/hr. Will recheck HL 6 hrs after rate change on 1/7 @ 0300.   Isobel Eisenhuth D, Pharm.D. Clinical Pharmacist 11/14/2015,7:29 PM

## 2015-11-14 NOTE — Consult Note (Signed)
PULMONARY CONSULT NOTE  Requesting MD/Service: Sainani/[facility] Date of consult: 01/05 Reason for consultation: Acute hypoxic respiratory failure PT PROFILE: [age] M with DM 2, CAD, CHF, CKD (baseline Cr 2.41) adm 01/04 with acute respiratory failure, cough, purulent sputum of one week duration. CXR revealed RLL consolidation. Admitted with diagnosis of CAP to ICU/SDU for BiPAP after ABG revealed respiratory acidosis. Has ruled in for NSTEMI and has worsening renal function.  MAJOR EVENTS/TEST RESULTS: 01/05 TTE: The estimated ejection fraction was in the range of 45% to 50%. Regional wall motion abnormalities: There appears to be severe hypokinesis of the apical, lateral and anterolateral myocardium 01/05 Troponin I peak @ 33  INDWELLING DEVICES::   MICRO DATA: Resp 01/05 >> inadequate specimen Blood 01/04 >> Pneumococcus  PCT 01/05  43.59, 41.25,   ANTIMICROBIALS:  Aztreonam 01/04 >> 01/05 Levofloxacin 01/04 >> 01/05 Ceftriaxone 01/05 >>   SUBJ:  No distress on BIPAP this AM. Mildly dyspneic off BiPAP. SpO2 98% on NRB. Cognition intact. Denies pain   Filed Vitals:   11/14/15 0855 11/14/15 0900 11/14/15 1000 11/14/15 1114  BP:  138/81 136/86   Pulse: 67 81 90   Temp:      TempSrc:      Resp: 21 32 35   Height:      Weight:      SpO2: 95% 100% 98% 97%     EXAM:  Gen: RASS 0, + F/C, mildly dyspneic appearing HEENT: NCAT, oropharynx normal Neck: Supple without LAN, thyromegaly,  No JVD noted Lungs: R basilar crackles, no wheezes Cardiovascular: Reg rhythm, no murmurs noted Abdomen: Soft, nontender, normal BS Ext: severe chronic stasis changes, symmetric, severe pretibial edema, partial amputation of R great toe Neuro: no focal deficits  DATA:   BMP Latest Ref Rng 11/14/2015 11/13/2015   Glucose 65 - 99 mg/dL 86 562(Z121(H) 308(M190(H)  BUN 6 - 20 mg/dL 57(Q82(H) 46(N70(H) 62(X69(H)  Creatinine 0.61 - 1.24 mg/dL 5.28(U3.35(H) 1.32(G3.24(H) 4.01(U2.95(H)  Sodium 135 - 145 mmol/L 135 138 138  Potassium  3.5 - 5.1 mmol/L 3.0(L) 3.3(L) 3.2(L)  Chloride 101 - 111 mmol/L 97(L) 99(L) 100(L)  CO2 22 - 32 mmol/L 24 23 22   Calcium 8.9 - 10.3 mg/dL 8.9 9.1 9.1    CBC Latest Ref Rng 11/14/2015 11/13/2015 11/15/2015  WBC 3.8 - 10.6 K/uL 17.2(H) 38.7(H) 26.1(H)  Hemoglobin 13.0 - 18.0 g/dL 8.1(L) 9.7(L) 10.6(L)  Hematocrit 40.0 - 52.0 % 25.3(L) 31.3(L) 34.0(L)  Platelets 150 - 440 K/uL 381 427 488(H)    CXR:  NSC RLL consolidation  IMPRESSION:   Pneumococcal PNA with bacteremia and severe sepsis  Clinically responding to abx and WBC much improved 01/06 NSTEMI - Cards following. Deemed stress induced due to primary diagnosis of PNA, severe sepsis Acute hypoxic/hypercarbic respiratory failure  Risk of intubation remains high but he is somewhat improved today H/O COPD without acute bronchospasm Transient hypotension - resolved AKI on CKD (baseline Cr 2.41), nonoliguric  Renal following Mild hypokalemia - repleted 01/06 DM 2  Episodic hypoglycemia Chronic anemia without overt acute blood loss   PLAN:  Cont BiPAP PRN Cont supplemental O2 to maintain SpO2 92-97% Cont to monitor in ICU/SDU until risk of intubation subsides Monitor BMET intermittently Monitor I/Os Correct electrolytes as indicated Advance diet to clear liquids SSI changed to sens scale 01/06 Monitor temp, WBC count Micro and abx as above DVT px: full dose heparin for NSTEMI Monitor CBC intermittently Transfuse per usual guidelines - Keep Hgb > 8.0 in setting of ACS  CCM time: 30 mins The above time includes time spent in consultation with patient and/or family members and reviewing care plan on multidisciplinary rounds  Billy Fischer, MD PCCM service Mobile [phone] Pager [phone]

## 2015-11-14 NOTE — Progress Notes (Signed)
ANTICOAGULATION CONSULT NOTE - Follow-up Consult  Pharmacy Consult for heparin Indication: chest pain/ACS  Allergies  Allergen Reactions  . Statins Swelling and Other (See Comments)    Reaction:  Rhabdomyolysis  . Ivp Dye [Iodinated Diagnostic Agents] Other (See Comments)    Reaction:  Unknown   . Penicillins Rash and Other (See Comments)    Unable to obtain enough information to answer additional questions about this medication.      Patient Measurements: Height: 5\' 9"  (175.3 cm) Weight: 283 lb 4.7 oz (128.5 kg) IBW/kg (Calculated) : 70.7 Heparin Dosing Weight: 100.4 kg  Vital Signs: Temp: 98.6 F (37 C) (01/05 1900) Temp Source: Axillary (01/05 1900) BP: 124/69 mmHg (01/06 0200) Pulse Rate: 71 (01/06 0200)  Labs:  Recent Labs  11/15/2015 1801 11/13/15 0033 11/13/15 0314 11/13/15 1008 11/13/15 1928 11/14/15 0322  HGB 10.6*  --  9.7*  --   --  8.1*  HCT 34.0*  --  31.3*  --   --  25.3*  PLT 488*  --  427  --   --  381  APTT  --   --  43*  --   --   --   LABPROT  --   --  17.0*  --   --   --   INR  --   --  1.37  --   --   --   HEPARINUNFRC  --   --   --  <0.10* <0.10* 0.11*  CREATININE 2.95*  --  3.24*  --   --  3.35*  TROPONINI 1.01* 11.85* 18.90* 32.74*  --   --     Estimated Creatinine Clearance: 31.5 mL/min (by C-G formula based on Cr of 3.35).   Medical History: Past Medical History  Diagnosis Date  . COPD (chronic obstructive pulmonary disease) (HCC)     a. on intermittent home O2  . Diabetes mellitus (HCC)   . Hypertension   . CKD (chronic kidney disease), stage IV (HCC)   . Sleep apnea   . Rhabdomyolysis   . Hyperlipidemia   . Morbid obesity (HCC)   . Depression   . Chronic diastolic CHF (congestive heart failure) (HCC)     a. echo 11/2013: EF 60-65%, mild LVH, mildly increased LV internal cavity size, moderately increased LV posterior wall thickness; b. echo 1/17: EF 45-50%, severe HK of apical, lateral, & anterolateral myocardium, technically  difficult images other regions of WMA cannot be excluded, GR1DD, mild MR, LA mild to mod dilated, PASP nl  . Allergy   . Chronic liver failure (HCC)   . Anemia of chronic disease   . Polysubstance abuse     a. prior abuse of benzos and acid  . PNA (pneumonia)     a. recurrent PNA 10/2015 and 11/2015 leading to acute respiratory failure    Medications:  Infusions:  . heparin 1,800 Units/hr (11/13/15 2023)    Assessment: 60 yo male with respiratory failure and agitation, troponin positive. Pharmacy consulted to dose heparin for ACS. Received [facility] at 0120 will not bolus. Patient initiated on heparin drip at 1000 units (10mL) per hour.    Goal of Therapy:  Heparin level 0.3-0.7 units/ml Monitor platelets by anticoagulation protocol: Yes   Plan:  Heparin level remains below goal so will bolus heparin 3000 units iv once and increase infusion to 2200 units/hr. Next HL in 6 hours.  Pharmacy will continue to monitor and adjust per consult.    Carola Frost, Pharm.D., BCPS Clinical  Pharmacist 11/14/2015,4:14 AM

## 2015-11-14 NOTE — Progress Notes (Signed)
Patient: Carl Perry / Admit Date: 12/09/2015 / Date of Encounter: 11/14/2015, 8:33 AM   Subjective: More comfortable this AM. On NRB. WBC 38-->17, SCr 3.24-->3.35, K+ 3.3-->3.0 s/p 20 meq this AM at 5 AM, procalcitonin 43-->41. Remains on heparin gtt. No chest pain. Breathing better. Not on Lasix past 24 hour.   Review of Systems: Review of Systems  Constitutional: Positive for weight loss and malaise/fatigue. Negative for fever, chills and diaphoresis.  HENT: Negative for congestion.   Eyes: Negative for discharge and redness.  Respiratory: Positive for cough, shortness of breath and wheezing. Negative for hemoptysis and sputum production.   Cardiovascular: Positive for orthopnea, leg swelling and PND. Negative for chest pain, palpitations and claudication.  Gastrointestinal: Negative for nausea and vomiting.  Musculoskeletal: Negative for falls.  Skin: Negative for rash.  Neurological: Positive for weakness. Negative for sensory change, speech change and focal weakness.  Endo/Heme/Allergies: Does not bruise/bleed easily.  Psychiatric/Behavioral: The patient is not nervous/anxious.      Objective: Telemetry: NSR, 70's Physical Exam: Blood pressure 129/86, pulse 70, temperature 97.5 F (36.4 C), temperature source Axillary, resp. rate 24, height 5\' 9"  (1.753 m), weight 283 lb 4.7 oz (128.5 kg), SpO2 100 %. Body mass index is 41.82 kg/(m^2). General: Well developed, well nourished, in no acute distress. Head: Normocephalic, atraumatic, sclera non-icteric, no xanthomas, nares are without discharge. Neck: Negative for carotid bruits. JVP not elevated. Lungs: Decreased breath sounds bilaterally. Coarse breath sounds. Breathing is unlabored. Heart: RRR S1 S2 without murmurs, rubs, or gallops.  Abdomen: Obese, soft, non-tender, non-distended with normoactive bowel sounds. No rebound/guarding. Extremities: No clubbing or cyanosis. 1+ pitting edema, chronic woody appearance, improved  erythema along right lower extremity. Distal pedal pulses are 2+ and equal bilaterally. Neuro: Alert and oriented X 3. Moves all extremities spontaneously. Psych:  Responds to questions appropriately with a normal affect.   Intake/Output Summary (Last 24 hours) at 11/14/15 0833 Last data filed at 11/14/15 0800  Gross per 24 hour  Intake 1935.41 ml  Output   1475 ml  Net 460.41 ml    Inpatient Medications:  . amitriptyline  50 mg Oral QHS  . antiseptic oral rinse  7 mL Mouth Rinse q12n4p  . aspirin EC  81 mg Oral Daily  . azithromycin  500 mg Intravenous Q24H  . cefTRIAXone (ROCEPHIN)  IV  2 g Intravenous Q24H  . chlorhexidine  15 mL Mouth Rinse BID  . gabapentin  600 mg Oral QHS  . gemfibrozil  600 mg Oral BID  . insulin aspart  0-15 Units Subcutaneous 6 times per day  . lactulose  10 g Oral BID  . potassium chloride  20 mEq Oral Once  . sodium chloride  3 mL Intravenous Q12H  . sodium chloride  3 mL Intravenous Q12H  . venlafaxine XR  225 mg Oral Daily   Infusions:  . heparin 2,200 Units/hr (11/14/15 0700)    Labs:  Recent Labs  12/03/2015 1801 11/13/15 0314 11/14/15 0322  NA 138 138 135  K 3.2* 3.3* 3.0*  CL 100* 99* 97*  CO2 22 23 24   GLUCOSE 190* 121* 86  BUN 69* 70* 82*  CREATININE 2.95* 3.24* 3.35*  CALCIUM 9.1 9.1 8.9  MG 2.1  --   --     Recent Labs  12/04/2015 1801  AST 18  ALT 11*  ALKPHOS 73  BILITOT 0.6  PROT 8.9*  ALBUMIN 2.6*    Recent Labs  11/18/2015 1801 11/13/15 0314  11/14/15 0322  WBC 26.1* 38.7* 17.2*  NEUTROABS 22.8*  --   --   HGB 10.6* 9.7* 8.1*  HCT 34.0* 31.3* 25.3*  MCV 77.4* 76.0* 75.0*  PLT 488* 427 381    Recent Labs  Dec 11, 2015 1801 11/13/15 0033 11/13/15 0314 11/13/15 1008  TROPONINI 1.01* 11.85* 18.90* 32.74*   Invalid input(s): POCBNP  Recent Labs  11/13/15 0033  HGBA1C 5.2     Weights: Filed Weights   12-11-2015 1811 12-11-15 2200  Weight: 266 lb (120.657 kg) 283 lb 4.7 oz (128.5 kg)      Radiology/Studies:  Dg Chest 2 View  11/04/2015  CLINICAL DATA:  Pneumonia. EXAM: CHEST  2 VIEW COMPARISON:  None. FINDINGS: Mediastinum and hilar structures normal. Right lower lobe and middle lobe atelectasis and infiltrate, improved from prior exam. No pleural effusion or pneumothorax. Cardiomegaly with normal pulmonary vascularity. No acute bony abnormality. IMPRESSION: 1. Right middle lobe and right lower lobe atelectasis and infiltrate improved from prior exam . 2. Cardiomegaly. Electronically Signed   By: Maisie Fus  Register   On: 11/04/2015 12:08   Dg Chest Port 1 View  11/14/2015  CLINICAL DATA:  Respiratory failure. EXAM: PORTABLE CHEST 1 VIEW COMPARISON:  12/11/15 . FINDINGS: Mediastinum hilar structures normal. Cardiomegaly with mild pulmonary vascular prominence noted. Low lung volumes with prominent bibasilar atelectasis and/or infiltrates/pulmonary edema, right side greater than left. Findings most likely related to basilar pneumonia particularly on the right. Small right pleural effusion. Similar findings noted on prior exam . IMPRESSION: 1. Cardiomegaly with pulmonary vascular congestion. 2. Low lung volumes with prominent bibasilar atelectasis and/or infiltrates/pulmonary edema, right side greater than left. Findings most likely related to pneumonia particularly in the right lung base. Small right pleural effusion. Similar findings noted on prior exam . Electronically Signed   By: Maisie Fus  Register   On: 11/14/2015 07:55   Dg Chest Portable 1 View  Dec 11, 2015  CLINICAL DATA:  Difficulty breathing for several days. History of COPD. EXAM: PORTABLE CHEST 1 VIEW COMPARISON:  11/04/2015, 12 /6/16 FINDINGS: Normal cardiac silhouette. There is increased airspace density in the RIGHT lower lobe. Low lung volumes. Upper lungs are clear. No pneumothorax. IMPRESSION: RIGHT lower lobe pneumonia has worsened compared to 11/04/2015 and similar to 10/14/2015 suggesting recurrent pneumonia. Recommend  follow-up CT to exclude underlying malignant lesion. Electronically Signed   By: Genevive Bi M.D.   On: 12/11/15 18:36     Assessment and Plan   1. Acute respiratory failure with hypoxia and hypercarbia: -In the setting of recurrent RLL PNA and possibly NSTEMI in the setting of acute infection  -On BiPAP -Refused intubation -Wean O2 as able  2. NSTEMI: -Never with chest pain -Troponin has trended up to 32 at the time of cardiology consult, will cycle this AM to trend -Not currently a cardiac cath candidate given patient's inability to lay supine 2/2 orthopnea, on BiPAP, CKD stage IV and refusal to go on dialysis (this was discussed with him), and worsening RLL PNA with procalitonin of 43 and up trending leukocytosis  -Continue heparin gtt at this time for 48-72 hours or until troponin down trends -Echo as below -History of hypotension with SBP into the 70s-90s and acute respiratory failure precludes initiation of beta blockers at this time -Once his infection resolves he would still need a cardiac cath given the profound troponin elevation of 32 as this is unlikely to demand ischemia. He is likely to have underlying coronary disease given his prior smoking history, he is a  diabetic, and morbidly obese   3. Severe sepsis: -Secondary to RLL  -On ABX per IM -Nebs per IM -Consider steroids if indicated   4. Chronic diastolic CHF: -Hypotension precludes Lopressor at this time -Worsening renal function precludes continued usage of torsemide and metolazone at this time -Limit IV fluids  -Echo with EF 45-50%, severe HK of apical, lateral, and anterolateral myocardium. Unable to exclude WMA of remaining walls  5. Ectopic atrial tach: -No clearly defined P waves on ECG -On heparin gtt as above -Currently in sinus  6. Acute on CKD stage IV: -Meds on hold as above -Per Renal -Has has continued to decline dialysis which precludes cath as above  7. Leukocytosis/elevated  procalcitonin: -Improving -Per #3  8. Hypokalemia: -Status post 20 meq 5 AM -Will give another 20 meq now -Must be mindful of his renal insufficiency  -Check BMET this PM -Replete to 4. 9. NSVT: -No further episodes in past 24 hours -Continue to monitor -High risk of arrhythmia given the above -Hypotension precludes beta blocker -Should he develop increased ventricular ectopy consider short term antiarrhythmic   Signed, Eula Listen, PA-C Pager: [phone] 11/14/2015, 8:33 AM     Attending Note Patient seen and examined, agree with detailed note above,  Patient presentation and plan discussed on rounds.   Improved SOB, still on Bipap, Still with increased work of breathing, unable to lay flat. Creatinine stable, slightly worse, Anemia with drop in HCT 31 to 25. ---would continue to provide supportive respiratory support as you are doing for now From a cardiac perspective, we are looking for an window of opportunity to perform a cardiac cath given his NSTEMI , suspected wall motion ABN on echo. ---Would continue heparin infusion until troponins trending down, Consider cath on early next week if HCT stabilizes, able to lay flat, renal function stable  Signed: Dossie Arbour  M.D., Ph.D. [facility]

## 2015-11-15 ENCOUNTER — Inpatient Hospital Stay: Payer: Commercial Managed Care - HMO

## 2015-11-15 LAB — BASIC METABOLIC PANEL
Anion gap: 13 (ref 5–15)
BUN: 88 mg/dL — AB (ref 6–20)
CO2: 25 mmol/L (ref 22–32)
CREATININE: 3.6 mg/dL — AB (ref 0.61–1.24)
Calcium: 8.8 mg/dL — ABNORMAL LOW (ref 8.9–10.3)
Chloride: 95 mmol/L — ABNORMAL LOW (ref 101–111)
GFR calc Af Amer: 20 mL/min — ABNORMAL LOW (ref >60)
GFR, EST NON AFRICAN AMERICAN: 17 mL/min — AB (ref >60)
GLUCOSE: 92 mg/dL (ref 65–99)
POTASSIUM: 3.8 mmol/L (ref 3.5–5.1)
Sodium: 133 mmol/L — ABNORMAL LOW (ref 135–145)

## 2015-11-15 LAB — CBC
HEMATOCRIT: 27.5 % — AB (ref 40.0–52.0)
Hemoglobin: 8.6 g/dL — ABNORMAL LOW (ref 13.0–18.0)
MCH: 24.2 pg — ABNORMAL LOW (ref 26.0–34.0)
MCHC: 31.3 g/dL — AB (ref 32.0–36.0)
MCV: 77.3 fL — ABNORMAL LOW (ref 80.0–100.0)
Platelets: 402 10*3/uL (ref 150–440)
RBC: 3.55 MIL/uL — ABNORMAL LOW (ref 4.40–5.90)
RDW: 20.1 % — ABNORMAL HIGH (ref 11.5–14.5)
WBC: 18.6 10*3/uL — ABNORMAL HIGH (ref 3.8–10.6)

## 2015-11-15 LAB — GLUCOSE, CAPILLARY
GLUCOSE-CAPILLARY: 84 mg/dL (ref 65–99)
GLUCOSE-CAPILLARY: 98 mg/dL (ref 65–99)
Glucose-Capillary: 101 mg/dL — ABNORMAL HIGH (ref 65–99)
Glucose-Capillary: 77 mg/dL (ref 65–99)
Glucose-Capillary: 88 mg/dL (ref 65–99)
Glucose-Capillary: 86 mg/dL (ref 65–99)

## 2015-11-15 LAB — CULTURE, BLOOD (ROUTINE X 2)

## 2015-11-15 LAB — HEPARIN LEVEL (UNFRACTIONATED)
HEPARIN UNFRACTIONATED: 0.25 [IU]/mL — AB (ref 0.30–0.70)
HEPARIN UNFRACTIONATED: 0.32 [IU]/mL (ref 0.30–0.70)
Heparin Unfractionated: 0.36 IU/mL (ref 0.30–0.70)

## 2015-11-15 LAB — PROCALCITONIN: PROCALCITONIN: 32.31 ng/mL

## 2015-11-15 MED ORDER — HEPARIN BOLUS VIA INFUSION
1500.0000 [IU] | Freq: Once | INTRAVENOUS | Status: AC
  Administered 2015-11-15: 1500 [IU] via INTRAVENOUS
  Filled 2015-11-15: qty 1500

## 2015-11-15 MED ORDER — MIDODRINE HCL 5 MG PO TABS
5.0000 mg | ORAL_TABLET | Freq: Two times a day (BID) | ORAL | Status: DC
  Administered 2015-11-15 – 2015-11-17 (×5): 5 mg via ORAL
  Filled 2015-11-15 (×5): qty 1

## 2015-11-15 NOTE — Progress Notes (Signed)
eLink Physician-Brief Progress Note Patient Name: Carl HoehnMichael E Essick DOB: 11-20-1955 MRN: [id number]   Date of Service  11/15/2015  HPI/Events of Note  Pt now desires full code status and intubation if warranted   Code status changed to full code      Intervention Category Intermediate Interventions: Other:  Shan Levansatrick Wright 11/15/2015, 3:57 PM

## 2015-11-15 NOTE — Progress Notes (Signed)
Patient had been talking with one of his daughters and RN called to come in the room. Patient told RN that *he* had told MD earlier that he *would* want CPR and to be "intubated if I really needed it but not right now". (Patient and daughter had evidentially been talking about earlier family discussion with MD. RN to talk to MD (currently intubating another patient) to clarify code status.

## 2015-11-15 NOTE — Progress Notes (Signed)
ANTICOAGULATION CONSULT NOTE - Follow-up Consult  Pharmacy Consult for heparin Indication: chest pain/ACS  Allergies  Allergen Reactions  . Statins Swelling and Other (See Comments)    Reaction:  Rhabdomyolysis  . Ivp Dye [Iodinated Diagnostic Agents] Other (See Comments)    Reaction:  Unknown   . Penicillins Rash and Other (See Comments)    Unable to obtain enough information to answer additional questions about this medication.      Patient Measurements: Height: 5\' 9"  (175.3 cm) Weight: 283 lb 4.7 oz (128.5 kg) IBW/kg (Calculated) : 70.7 Heparin Dosing Weight: 100.4 kg  Vital Signs: Temp: 97.8 F (36.6 C) (01/07 0000) Temp Source: Axillary (01/07 0000) BP: 128/73 mmHg (01/07 0200) Pulse Rate: 83 (01/07 0300)  Labs:  Recent Labs  11/13/15 0314 11/13/15 1008  11/14/15 0322 11/14/15 1050 11/14/15 1819 11/15/15 0335  HGB 9.7*  --   --  8.1*  --   --  8.6*  HCT 31.3*  --   --  25.3*  --   --  27.5*  PLT 427  --   --  381  --   --  402  APTT 43*  --   --   --   --   --   --   LABPROT 17.0*  --   --   --   --   --   --   INR 1.37  --   --   --   --   --   --   HEPARINUNFRC  --  <0.10*  < > 0.11* 0.30 0.24* 0.25*  CREATININE 3.24*  --   --  3.35*  --   --  3.60*  TROPONINI 18.90* 32.74*  --   --  15.27*  --   --   < > = values in this interval not displayed.  Estimated Creatinine Clearance: 29.3 mL/min (by C-G formula based on Cr of 3.6).   Medical History: Past Medical History  Diagnosis Date  . COPD (chronic obstructive pulmonary disease) (HCC)     a. on intermittent home O2  . Diabetes mellitus (HCC)   . Hypertension   . CKD (chronic kidney disease), stage IV (HCC)   . Sleep apnea   . Rhabdomyolysis   . Hyperlipidemia   . Morbid obesity (HCC)   . Depression   . Chronic diastolic CHF (congestive heart failure) (HCC)     a. echo 11/2013: EF 60-65%, mild LVH, mildly increased LV internal cavity size, moderately increased LV posterior wall thickness; b. echo  1/17: EF 45-50%, severe HK of apical, lateral, & anterolateral myocardium, technically difficult images other regions of WMA cannot be excluded, GR1DD, mild MR, LA mild to mod dilated, PASP nl  . Allergy   . Chronic liver failure (HCC)   . Anemia of chronic disease   . Polysubstance abuse     a. prior abuse of benzos and acid  . PNA (pneumonia)     a. recurrent PNA 10/2015 and 11/2015 leading to acute respiratory failure    Medications:  Infusions:  . heparin 2,500 Units/hr (11/15/15 0403)    Assessment: 60 yo male with respiratory failure and agitation, troponin positive. Pharmacy consulted to dose heparin for ACS. Received [facility] at 0120 will not bolus. Patient initiated on heparin drip at 1000 units (10mL) per hour.    Goal of Therapy:  Heparin level 0.3-0.7 units/ml Monitor platelets by anticoagulation protocol: Yes   Plan:  Heparin level subtherapeutic. Bolus 1500 units IV x 1  and increase rate to 2700 units/hr. Will recheck level in 6 hours.  Carola Frost, Pharm.D., BCPS Clinical Pharmacist 11/15/2015,4:39 AM

## 2015-11-15 NOTE — Progress Notes (Signed)
RT to room to place patient on HFNC.  Removed Bipap and placed patient on 45% @ 40LPM HFNC, tolerating well, O2 sat 94% on HFNC.  Will continue to monitor.

## 2015-11-15 NOTE — Progress Notes (Signed)
[facility] -  at [facility]   PATIENT NAME: Carl Perry    MR#:  [id number]  DATE OF BIRTH:  [birth year]  SUBJECTIVE:   Patient here due to acute respiratory failure from pneumonia and also noted to be Septic.  Also noted to have elevated troponin consistent with a non-ST elevation MI.  On eye flow oxygen through nasal cannula , O2 sats at around 93% . Intermittently on Bipap for significant resp. Distress.  No other acute events overnight, per nursing staff. Patient feels comfortable. Denies any pain. Left extremity swelling is decreasing. Patient is not on diuretics. Vital signs are stable. Kidney function is relatively stable.   REVIEW OF SYSTEMS:    Review of Systems  Constitutional: Negative for fever and chills.  HENT: Negative for congestion and tinnitus.   Eyes: Negative for blurred vision and double vision.  Respiratory: Positive for cough, shortness of breath and wheezing.   Cardiovascular: Negative for chest pain, orthopnea and PND.  Gastrointestinal: Negative for nausea, vomiting, abdominal pain and diarrhea.  Genitourinary: Negative for dysuria and hematuria.  Neurological: Positive for weakness. Negative for dizziness, sensory change and focal weakness.  All other systems reviewed and are negative.   Nutrition: NPO Tolerating Diet: No Tolerating PT: Await Eval   DRUG ALLERGIES:   Allergies  Allergen Reactions  . Statins Swelling and Other (See Comments)    Reaction:  Rhabdomyolysis  . Ivp Dye [Iodinated Diagnostic Agents] Other (See Comments)    Reaction:  Unknown   . Penicillins Rash and Other (See Comments)    Unable to obtain enough information to answer additional questions about this medication.      VITALS:  Blood pressure 126/71, pulse 34, temperature 98.1 F (36.7 C), temperature source Oral, resp. rate 21, height 5\' 9"  (1.753 m), weight 128.5 kg (283 lb 4.7 oz), SpO2 92 %.  PHYSICAL EXAMINATION:   Physical  Exam  GENERAL:  60 y.o.-year-old obese patient lying in the bed in mild resp. Distress, on high flow oxygen through nasal cannulas EYES: Pupils equal, round, reactive to light and accommodation. No scleral icterus. Extraocular muscles intact.  HEENT: Head atraumatic, normocephalic. Oropharynx and nasopharynx clear.  NECK:  Supple, no jugular venous distention. No thyroid enlargement, no tenderness.  LUNGS: good a/e b/l, scattered wheezing, rhonchi bilaterally. Positive use of accessory muscles of respiration.  CARDIOVASCULAR: S1, S2 tachycardic. No murmurs, rubs, or gallops.  ABDOMEN: Soft, nontender, nondistended. Bowel sounds present. No organomegaly or mass.  EXTREMITIES: No cyanosis, clubbing or +1-2 edema b/l.   NEUROLOGIC: Cranial nerves II through XII are intact. No focal Motor or sensory deficits b/l.  Globally weak PSYCHIATRIC: The patient is alert and oriented x 3.  SKIN: No obvious rash, lesion, or ulcer. Signs of chronic venous stasis bilaterally   LABORATORY PANEL:   CBC  Recent Labs Lab 11/15/15 0335  WBC 18.6*  HGB 8.6*  HCT 27.5*  PLT 402   ------------------------------------------------------------------------------------------------------------------  Chemistries   Recent Labs Lab 11-Dec-2015 1801  11/15/15 0335  NA 138  < > 133*  K 3.2*  < > 3.8  CL 100*  < > 95*  CO2 22  < > 25  GLUCOSE 190*  < > 92  BUN 69*  < > 88*  CREATININE 2.95*  < > 3.60*  CALCIUM 9.1  < > 8.8*  MG 2.1  --   --   AST 18  --   --   ALT 11*  --   --  ALKPHOS 73  --   --   BILITOT 0.6  --   --   < > = values in this interval not displayed. ------------------------------------------------------------------------------------------------------------------  Cardiac Enzymes  Recent Labs Lab 11/14/15 1050  TROPONINI 15.27*   ------------------------------------------------------------------------------------------------------------------  RADIOLOGY:  Dg Chest Port 1  View  11/15/2015  CLINICAL DATA:  Respiratory failure. EXAM: PORTABLE CHEST 1 VIEW COMPARISON:  Chest radiograph 11/14/2015. FINDINGS: Stable enlarged cardiac mediastinal contours. Unchanged pulmonary consolidation within the right lower lung. Minimal heterogeneous opacities left lung base. Small right pleural effusion. No pneumothorax. IMPRESSION: Unchanged right-greater-than-left basilar airspace opacities which may represent atelectasis or infection. Small right pleural effusion. Electronically Signed   By: Annia Beltrew  Davis M.D.   On: 11/15/2015 07:10   Dg Chest Port 1 View  11/14/2015  CLINICAL DATA:  Respiratory failure. EXAM: PORTABLE CHEST 1 VIEW COMPARISON:  11-14-2015 . FINDINGS: Mediastinum hilar structures normal. Cardiomegaly with mild pulmonary vascular prominence noted. Low lung volumes with prominent bibasilar atelectasis and/or infiltrates/pulmonary edema, right side greater than left. Findings most likely related to basilar pneumonia particularly on the right. Small right pleural effusion. Similar findings noted on prior exam . IMPRESSION: 1. Cardiomegaly with pulmonary vascular congestion. 2. Low lung volumes with prominent bibasilar atelectasis and/or infiltrates/pulmonary edema, right side greater than left. Findings most likely related to pneumonia particularly in the right lung base. Small right pleural effusion. Similar findings noted on prior exam . Electronically Signed   By: Maisie Fushomas  Register   On: 11/14/2015 07:55     ASSESSMENT AND PLAN:   60 year old male with past medical history of COPD, diabetes, hypertension, chronic kidney disease stage IV, obstructive sleep apnea, hyperlipidemia, morbid obesity, depression, chronic diastolic CHF, anemia of chronic disease who presented to the hospital due to shortness of breath and noted to be in acute respiratory failure.  #1 acute respiratory failure with hypoxia-this is due to underlying pneumonia. -Continue IV antibiotics with ceftriaxone,  Zithromax , got discontinued yesterday due to positive blood cultures for Streptococcus. Continue BiPAP support as needed. -Patient is at high risk for intubation but does not want to be intubated, although is full code.  - Pulm following, input is appreciated.    #2 pneumonia-suspect streptococcal pneumonia. Patient's blood cultures are positive for Streptococcus. -Continue IV ceftriaxone. sputum cultures is negative.  #3 non-ST elevation MI-patient has ruled in by cardiac markers. He remains clinically asymptomatic though. -Patient likely needs a cardiac catheterization but is high risk given his chronic kidney disease and is not stable from the respiratory standpoint cardiac catheterization presently. -Continue ASA,  heparin nomogram. Appreciate cardiology input, and continue medical management. Patient is not on beta blockers , nitroglycerin given his relative hypotension and sepsis.  #4 sepsis due to Streptococcus pneumonia secondary to pneumonia. -Continue IV ceftriaxone. blood cultures which are growing Streptococcus.  #5 acute on chronic renal failure-he has baseline CK disease stage IV. He does not want dialysis. - Baseline Cr. 2.6 in 12/16 and today 3.6.  Likely Cardiorenal in nature given his NSTEMI.  Urine output is good at 1300 ml/24 hrs.   -Nephrology has been consulted and continue care as per them.  #6 hypokalemia-resolved  #7 depression - continue Elavil, Effexor.   #8 diabetes type 2 without complication-continue sliding scale insulin.  #9 diabetic neuropathy-continue Neurontin.  All the records are reviewed and case discussed with Care Management/Social Workerr. Management plans discussed with the patient, family and they are in agreement.  CODE STATUS: Full   DVT Prophylaxis: Heparin gtt  TOTAL Critical Care TIME TAKING CARE OF THIS PATIENT: 40 minutes.   POSSIBLE D/C IN 2-3 DAYS, DEPENDING ON CLINICAL CONDITION.   Katharina Caper M.D on 11/15/2015 at 1:41  PM  Between 7am to 6pm - Pager - [phone]  After 6pm go to [email or web address] - password [email or web address] [facility]  [facility]  Office  [phone]  CC: Primary care physician; Ruel Favors, MD

## 2015-11-15 NOTE — Progress Notes (Signed)
ANTICOAGULATION CONSULT NOTE - Follow Up Consult  Pharmacy Consult for Heparin drip Indication: chest pain/ACS  Allergies  Allergen Reactions  . Statins Swelling and Other (See Comments)    Reaction:  Rhabdomyolysis  . Ivp Dye [Iodinated Diagnostic Agents] Other (See Comments)    Reaction:  Unknown   . Penicillins Rash and Other (See Comments)    Unable to obtain enough information to answer additional questions about this medication.      Patient Measurements: Height: 5\' 9"  (175.3 cm) Weight: 283 lb 4.7 oz (128.5 kg) IBW/kg (Calculated) : 70.7 Heparin Dosing Weight: 100.4 kg  Vital Signs: Temp: 98.3 F (36.8 C) (01/07 1600) Temp Source: Axillary (01/07 1600) BP: 123/72 mmHg (01/07 1700) Pulse Rate: 86 (01/07 1853)  Labs:  Recent Labs  11/13/15 0314 11/13/15 1008  11/14/15 0322 11/14/15 1050  11/15/15 0335 11/15/15 1126 11/15/15 1915  HGB 9.7*  --   --  8.1*  --   --  8.6*  --   --   HCT 31.3*  --   --  25.3*  --   --  27.5*  --   --   PLT 427  --   --  381  --   --  402  --   --   APTT 43*  --   --   --   --   --   --   --   --   LABPROT 17.0*  --   --   --   --   --   --   --   --   INR 1.37  --   --   --   --   --   --   --   --   HEPARINUNFRC  --  <0.10*  < > 0.11* 0.30  < > 0.25* 0.36 0.32  CREATININE 3.24*  --   --  3.35*  --   --  3.60*  --   --   TROPONINI 18.90* 32.74*  --   --  15.27*  --   --   --   --   < > = values in this interval not displayed.  Estimated Creatinine Clearance: 29.3 mL/min (by C-G formula based on Cr of 3.6).   Medications:  Infusions:  . heparin 2,700 Units/hr (11/15/15 1900)    Assessment: 1/7 19:15 Heparin level resulted at 0.32, within therapeutic range.  Goal of Therapy:  Heparin level 0.3-0.7 units/ml Monitor platelets by anticoagulation protocol: Yes   Plan:  Will continue with current rate at check next heparin level and CBC with am labs.  Clovia CuffLisa Layloni Fahrner, PharmD, BCPS 11/15/2015 8:47 PM

## 2015-11-15 NOTE — Progress Notes (Signed)
While MD was talking with family about patient condition and code status, patient threw up while wearing BiPAP mask. Nausea relieved by prn zofran. RN notes no changes in lung sounds but FiO2 on BiPAP had to be increased to 50% per RT due to O2 sats sustaining at 87% with excellent waveform. MD aware and to order chest x-ray for tomorrow.

## 2015-11-15 NOTE — Consult Note (Signed)
PULMONARY PROGRESS NOTE   PT PROFILE: 51 M with DM 2, CAD, CHF, CKD (baseline Cr 2.41) adm 01/04 with acute respiratory failure, cough, purulent sputum of one week duration. CXR revealed RLL consolidation. Admitted with diagnosis of CAP to ICU/SDU for BiPAP after ABG revealed respiratory acidosis. Has ruled in for NSTEMI and has worsening renal function.  MAJOR EVENTS/TEST RESULTS: 01/05 TTE: The estimated ejection fraction was in the range of 45% to 50%. Regional wall motion abnormalities: There appears to be severe hypokinesis of the apical, lateral and anterolateral myocardium 01/05 Troponin I peak @ 33  INDWELLING DEVICES::   MICRO DATA: Resp 01/05 >> inadequate specimen Blood 01/04 >> Pneumococcus  PCT 01/05  43.59, 41.25,   ANTIMICROBIALS:  Aztreonam 01/04 >> 01/05 Levofloxacin 01/04 >> 01/05 Ceftriaxone 01/05 >>   SUBJ:  . He is more dyspneic this am. Currently on hi flow nasal cannula. Appears somewhat confused.   Filed Vitals:   11/15/15 1100 11/15/15 1102 11/15/15 1153 11/15/15 1200  BP: 123/73 123/73  126/71  Pulse: 83 84  34  Temp:   98.1 F (36.7 C)   TempSrc:   Oral   Resp: 24 27  21   Height:      Weight:      SpO2: 89% 93%  92%     EXAM:  Gen: RASS 0, + F/C,moderate dyspnea HEENT: NCAT, oropharynx normal Neck: Supple without LAN, thyromegaly,  No JVD noted Lungs: R basilar crackles, no wheezes this am.  Cardiovascular: Reg rhythm, no murmurs noted Abdomen: Soft, nontender, normal BS Ext: severe chronic stasis changes, symmetric, severe pretibial edema, partial amputation of R great toe Neuro: no focal deficits  DATA:   BMP Latest Ref Rng 11/15/2015 11/14/2015 11/13/2015  Glucose 65 - 99 mg/dL 92 86 161(W)  BUN 6 - 20 mg/dL 96(E) 45(W) 09(W)  Creatinine 0.61 - 1.24 mg/dL 1.19(J) 4.78(G) 9.56(O)  Sodium 135 - 145 mmol/L 133(L) 135 138  Potassium 3.5 - 5.1 mmol/L 3.8 3.0(L) 3.3(L)  Chloride 101 - 111 mmol/L 95(L) 97(L) 99(L)  CO2 22 - 32 mmol/L 25 24  23   Calcium 8.9 - 10.3 mg/dL 1.3(Y) 8.9 9.1    CBC Latest Ref Rng 11/15/2015 11/14/2015 11/13/2015  WBC 3.8 - 10.6 K/uL 18.6(H) 17.2(H) 38.7(H)  Hemoglobin 13.0 - 18.0 g/dL 8.6(V) 8.1(L) 9.7(L)  Hematocrit 40.0 - 52.0 % 27.5(L) 25.3(L) 31.3(L)  Platelets 150 - 440 K/uL 402 381 427    CXR:  NSC RLL consolidation  IMPRESSION:   Pneumococcal PNA with bacteremia and severe sepsis  Clinically responding to abx and WBC much improved 01/06 NSTEMI - Cards following. Deemed stress induced due to primary diagnosis of PNA, severe sepsis Acute hypoxic/hypercarbic respiratory failure  Risk of intubation remains high  H/O COPD without acute bronchospasm Transient hypotension - resolved AKI on CKD (baseline Cr 2.41), nonoliguric  Renal following Mild hypokalemia - repleted 01/06 DM 2  Episodic hypoglycemia Chronic anemia without overt acute blood loss Delirium.   PLAN:  Continue hi-flow and bipap and prn.  Cont supplemental O2 to maintain SpO2 92-97% Cont to monitor in ICU/SDU until risk of intubation subsides Monitor BMET intermittently Monitor I/Os Continue current management, discussed severity, he is at high risk of worsening respiratory failure and that he may end up on the ventilator. I discussed with him and his family that is may never come off the vent if he goes on, his family is in agreement that he would not want to be on the ventilator life long  Code status is changed to DNR.    CCM time: 30 mins The above time includes time spent in consultation with patient and/or family members and reviewing care plan on multidisciplinary rounds  Deep Nicholos Johnsamachandran, M.D>  PCCM service

## 2015-11-15 NOTE — Progress Notes (Signed)
[facility]  ROUNDING NOTE   Subjective:   Not doing well.  Family at bedside Creatinine 3.6 (3.35) (3.24) UOP 1300 (1475)  Objective:  Vital signs in last 24 hours:  Temp:  [97.2 F (36.2 C)-98.6 F (37 C)] 98.1 F (36.7 C) (01/07 1153) Pulse Rate:  [34-89] 86 (01/07 1500) Resp:  [17-33] 21 (01/07 1500) BP: (90-134)/(59-81) 129/81 mmHg (01/07 1500) SpO2:  [76 %-99 %] 93 % (01/07 1500) FiO2 (%):  [40 %-45 %] 45 % (01/07 1200)  Weight change:  Filed Weights   11/24/2015 1811 11/25/2015 2200  Weight: 120.657 kg (266 lb) 128.5 kg (283 lb 4.7 oz)    Intake/Output: I/O last 3 completed shifts: In: 2029.6 [P.O.:1170; I.V.:809.6; IV Piggyback:50] Out: 5885 [Urine:1875]   Intake/Output this shift:  Total I/O In: 695 [P.O.:560; I.V.:135] Out: 220 [Urine:220]  Physical Exam: General: Critically ill, in respiratory distress  Head: Normocephalic, atraumatic. Moist oral mucosal membranes  Eyes: Anicteric, PERRL  Neck: Supple, trachea midline  Lungs:  Bilateral wheezing, +BiPap  Heart: Regular rate and rhythm  Abdomen:  Soft, nontender, obese  Extremities: 1+ peripheral edema.  Neurologic: Nonfocal, moving all four extremities  Skin: No lesions  Access: none    Basic Metabolic Panel:  Recent Labs Lab 12/05/2015 1801 11/13/15 0314 11/14/15 0322 11/15/15 0335  NA 138 138 135 133*  K 3.2* 3.3* 3.0* 3.8  CL 100* 99* 97* 95*  CO2 '22 23 24 25  '$ GLUCOSE 190* 121* 86 92  BUN 69* 70* 82* 88*  CREATININE 2.95* 3.24* 3.35* 3.60*  CALCIUM 9.1 9.1 8.9 8.8*  MG 2.1  --   --   --     Liver Function Tests:  Recent Labs Lab 11/19/2015 1801  AST 18  ALT 11*  ALKPHOS 73  BILITOT 0.6  PROT 8.9*  ALBUMIN 2.6*   No results for input(s): LIPASE, AMYLASE in the last 168 hours. No results for input(s): AMMONIA in the last 168 hours.  CBC:  Recent Labs Lab 11/17/2015 1801 11/13/15 0314 11/14/15 0322 11/15/15 0335  WBC 26.1* 38.7* 17.2* 18.6*  NEUTROABS 22.8*   --   --   --   HGB 10.6* 9.7* 8.1* 8.6*  HCT 34.0* 31.3* 25.3* 27.5*  MCV 77.4* 76.0* 75.0* 77.3*  PLT 488* 427 381 402    Cardiac Enzymes:  Recent Labs Lab 11/13/2015 1801 11/13/15 0033 11/13/15 0314 11/13/15 1008 11/14/15 1050  TROPONINI 1.01* 11.85* 18.90* 32.74* 15.27*    BNP: Invalid input(s): POCBNP  CBG:  Recent Labs Lab 11/14/15 1938 11/14/15 2332 11/15/15 0333 11/15/15 0731 11/15/15 1155  GLUCAP 107* 108* 101* 84 72    Microbiology: Results for orders placed or performed during the hospital encounter of 12/09/2015  Blood culture (routine x 2)     Status: None   Collection Time: 12/08/2015  6:01 PM  Result Value Ref Range Status   Specimen Description BLOOD LEFT HAND  Final   Special Requests BOTTLES DRAWN AEROBIC AND ANAEROBIC 4CC  Final   Culture  Setup Time   Final    GRAM POSITIVE COCCI IN PAIRS IN BOTH AEROBIC AND ANAEROBIC BOTTLES CRITICAL VALUE NOTED.  VALUE IS CONSISTENT WITH PREVIOUSLY REPORTED AND CALLED VALUE.    Culture   Final    STREPTOCOCCUS PNEUMONIAE IN BOTH AEROBIC AND ANAEROBIC BOTTLES    Report Status 11/15/2015 FINAL  Final   Organism ID, Bacteria STREPTOCOCCUS PNEUMONIAE  Final      Susceptibility   Streptococcus pneumoniae - MIC*  ERYTHROMYCIN <=0.12 SENSITIVE Sensitive     VANCOMYCIN 0.5 SENSITIVE Sensitive     TRIMETH/SULFA <=10 SENSITIVE Sensitive     CLINDAMYCIN <=0.25 SENSITIVE Sensitive     CEFTRIAXONE Value in next row Sensitive      SENSITIVE<=0.12    PENICILLIN Value in next row Sensitive      SENSITIVE<=0.06    * STREPTOCOCCUS PNEUMONIAE  Blood culture (routine x 2)     Status: None   Collection Time: 11/17/2015  6:07 PM  Result Value Ref Range Status   Specimen Description BLOOD LEFT ASSIST CONTROL  Final   Special Requests BOTTLES DRAWN AEROBIC AND ANAEROBIC [place]  Final   Culture  Setup Time   Final    GRAM POSITIVE COCCI IN PAIRS IN BOTH AEROBIC AND ANAEROBIC BOTTLES CRITICAL RESULT CALLED TO, READ BACK BY AND  VERIFIED WITH: HANK ZOMPA AT 6283 ON 11/23/15 CTJ    Culture   Final    STREPTOCOCCUS PNEUMONIAE IN BOTH AEROBIC AND ANAEROBIC BOTTLES    Report Status 11/15/2015 FINAL  Final   Organism ID, Bacteria STREPTOCOCCUS PNEUMONIAE  Final      Susceptibility   Streptococcus pneumoniae - MIC*    ERYTHROMYCIN <=0.12 SENSITIVE Sensitive     VANCOMYCIN 0.5 SENSITIVE Sensitive     TRIMETH/SULFA <=10 SENSITIVE Sensitive     CLINDAMYCIN <=0.25 SENSITIVE Sensitive     CEFTRIAXONE Value in next row Sensitive      SENSITIVE<=0.12    PENICILLIN Value in next row Sensitive      SENSITIVE<=0.06    * STREPTOCOCCUS PNEUMONIAE  Blood Culture ID Panel (Reflexed)     Status: Abnormal   Collection Time: 12/04/2015  6:07 PM  Result Value Ref Range Status   Enterococcus species NOT DETECTED NOT DETECTED Final   Listeria monocytogenes NOT DETECTED NOT DETECTED Final   Staphylococcus species NOT DETECTED NOT DETECTED Final   Staphylococcus aureus NOT DETECTED NOT DETECTED Final   Streptococcus species DETECTED (A) NOT DETECTED Corrected    Comment: CRITICAL RESULT CALLED TO, READ BACK BY AND VERIFIED WITH: HANK ZOMPA AT 6629 ON 11/13/15 CTJ CORRECTED ON 01/06 AT 4765: PREVIOUSLY REPORTED AS DETECTED    Streptococcus agalactiae NOT DETECTED NOT DETECTED Final   Streptococcus pneumoniae DETECTED (A) NOT DETECTED Final    Comment: CRITICAL RESULT CALLED TO, READ BACK BY AND VERIFIED WITH: HANK ZOMPA AT 4650 ON 11/13/15 CTJ    Streptococcus pyogenes NOT DETECTED NOT DETECTED Final   Acinetobacter baumannii NOT DETECTED NOT DETECTED Final   Enterobacteriaceae species NOT DETECTED NOT DETECTED Final   Enterobacter cloacae complex NOT DETECTED NOT DETECTED Final   Escherichia coli NOT DETECTED NOT DETECTED Final   Klebsiella oxytoca NOT DETECTED NOT DETECTED Final   Klebsiella pneumoniae NOT DETECTED NOT DETECTED Final   Proteus species NOT DETECTED NOT DETECTED Final   Serratia marcescens NOT DETECTED NOT DETECTED  Final   Haemophilus influenzae NOT DETECTED NOT DETECTED Final   Neisseria meningitidis NOT DETECTED NOT DETECTED Final   Pseudomonas aeruginosa NOT DETECTED NOT DETECTED Final   Candida albicans NOT DETECTED NOT DETECTED Final   Candida glabrata NOT DETECTED NOT DETECTED Final   Candida krusei NOT DETECTED NOT DETECTED Final   Candida parapsilosis NOT DETECTED NOT DETECTED Final   Candida tropicalis NOT DETECTED NOT DETECTED Final   Carbapenem resistance NOT DETECTED NOT DETECTED Final   Methicillin resistance NOT DETECTED NOT DETECTED Final   Vancomycin resistance NOT DETECTED NOT DETECTED Final  MRSA PCR Screening  Status: None   Collection Time: 11/13/2015 10:01 PM  Result Value Ref Range Status   MRSA by PCR NEGATIVE NEGATIVE Final    Comment:        The GeneXpert MRSA Assay (FDA approved for NASAL specimens only), is one component of a comprehensive MRSA colonization surveillance program. It is not intended to diagnose MRSA infection nor to guide or monitor treatment for MRSA infections.   Culture, expectorated sputum-assessment     Status: None   Collection Time: 11/13/15  2:56 AM  Result Value Ref Range Status   Specimen Description SPUTUM  Final   Special Requests Normal  Final   Sputum evaluation   Final    Sputum specimen not acceptable for testing.  Please recollect.   CALLED BETH BUONO AT 6462 11/13/15.PMH   Report Status 11/13/2015 FINAL  Final    Coagulation Studies:  Recent Labs  11/13/15 0314  LABPROT 17.0*  INR 1.37    Urinalysis: No results for input(s): COLORURINE, LABSPEC, PHURINE, GLUCOSEU, HGBUR, BILIRUBINUR, KETONESUR, PROTEINUR, UROBILINOGEN, NITRITE, LEUKOCYTESUR in the last 72 hours.  Invalid input(s): APPERANCEUR    Imaging: Dg Chest Port 1 View  11/15/2015  CLINICAL DATA:  Respiratory failure. EXAM: PORTABLE CHEST 1 VIEW COMPARISON:  Chest radiograph 11/14/2015. FINDINGS: Stable enlarged cardiac mediastinal contours. Unchanged pulmonary  consolidation within the right lower lung. Minimal heterogeneous opacities left lung base. Small right pleural effusion. No pneumothorax. IMPRESSION: Unchanged right-greater-than-left basilar airspace opacities which may represent atelectasis or infection. Small right pleural effusion. Electronically Signed   By: Annia Belt M.D.   On: 11/15/2015 07:10   Dg Chest Port 1 View  11/14/2015  CLINICAL DATA:  Respiratory failure. EXAM: PORTABLE CHEST 1 VIEW COMPARISON:  11/10/2015 . FINDINGS: Mediastinum hilar structures normal. Cardiomegaly with mild pulmonary vascular prominence noted. Low lung volumes with prominent bibasilar atelectasis and/or infiltrates/pulmonary edema, right side greater than left. Findings most likely related to basilar pneumonia particularly on the right. Small right pleural effusion. Similar findings noted on prior exam . IMPRESSION: 1. Cardiomegaly with pulmonary vascular congestion. 2. Low lung volumes with prominent bibasilar atelectasis and/or infiltrates/pulmonary edema, right side greater than left. Findings most likely related to pneumonia particularly in the right lung base. Small right pleural effusion. Similar findings noted on prior exam . Electronically Signed   By: Maisie Fus  Register   On: 11/14/2015 07:55     Medications:   . heparin 2,700 Units/hr (11/15/15 1402)   . amitriptyline  50 mg Oral QHS  . antiseptic oral rinse  7 mL Mouth Rinse q12n4p  . aspirin EC  81 mg Oral Daily  . cefTRIAXone (ROCEPHIN)  IV  2 g Intravenous Q24H  . chlorhexidine  15 mL Mouth Rinse BID  . gabapentin  600 mg Oral QHS  . gemfibrozil  600 mg Oral BID  . lactulose  10 g Oral BID  . midodrine  5 mg Oral BID WC  . sodium chloride  3 mL Intravenous Q12H  . sodium chloride  3 mL Intravenous Q12H  . venlafaxine XR  225 mg Oral Daily   sodium chloride, acetaminophen **OR** acetaminophen, acetaminophen-codeine, albuterol, baclofen, docusate sodium, ondansetron **OR** ondansetron (ZOFRAN)  IV, sodium chloride, sodium chloride  Assessment/ Plan:  Carl Perry is a 60 y.o. 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   1. Acute Renal Failure on  Chronic kidney disease stage IV with nephrotic range proteinuria: baseline creatinine of 2.69, eGFR of 25, 10/21/2015 Acute renal failure from acute illness: acute cardiac syndrome, pneumonia, and hypotension. Doubt this is post strep glomerulonephritis.   Chronic kidney disease secondary with nephrotic syndrome to long standing diabetes mellitus insulin dependent type II, hypertension and nonsteroidal use. Patient's creatinine fluctuates with fluid status due to cardiorenal syndrome. Not a candidate for a renal biopsy - patient has deferred dialysis access placement as recently as last month (12/13). Discussed dialysis and the risk of IV contrast for cardiac catheterization causing renal failure. At this time, patient states he is not mentally prepared for dialysis.  - Currently off enalapril due to advanced renal disease and hyperkalemia. However now with hypokalemia.  - continue to hold diuretics.  - Monitor for dialysis need, monitor urine output, volume status and serum electrolytes. No acute indication for dialysis at this time.   2. Hypotension with Acute exacerbation of systolic/diastolic CHF with pulmonary edema and acute exacerbation of COPD. with history of anasarca/edema: Blood pressure improved.  - Holding torsemide and metolazone. - Continue low salt diet. Continue fluid restriction.   3. Pneumonia/sepsis: blood cultures with strep -  ceftriaxone.  - will need a CT-chest when more stable.   4. Diabetes Mellitus type II insulin dependent with chronic kidney disease:  With nephrotic range proteinuria.  - Continue glucose  control.   5. Secondary Hyperparathyroidism: PTH 86  On 12/13, Calcium and phosphorus at goal.  - not currently on an vitamin D agent.  5. Anemia of chronic kidney disease: hemoglobin 8.6 - would not offer epo due to recent ischemia.  - low threshold for PRBC transfusion with recent cardiac ischemia.    LOS: [place], [place] 1/7/20173:46 PM

## 2015-11-15 NOTE — Progress Notes (Signed)
ANTICOAGULATION CONSULT NOTE - Follow-up Consult  Pharmacy Consult for heparin Indication: chest pain/ACS  Allergies  Allergen Reactions  . Statins Swelling and Other (See Comments)    Reaction:  Rhabdomyolysis  . Ivp Dye [Iodinated Diagnostic Agents] Other (See Comments)    Reaction:  Unknown   . Penicillins Rash and Other (See Comments)    Unable to obtain enough information to answer additional questions about this medication.      Patient Measurements: Height: 5\' 9"  (175.3 cm) Weight: 283 lb 4.7 oz (128.5 kg) IBW/kg (Calculated) : 70.7 Heparin Dosing Weight: 100.4 kg  Vital Signs: Temp: 98.1 F (36.7 C) (01/07 1153) Temp Source: Oral (01/07 1153) BP: 126/71 mmHg (01/07 1200) Pulse Rate: 34 (01/07 1200)  Labs:  Recent Labs  11/13/15 0314 11/13/15 1008  11/14/15 0322 11/14/15 1050 11/14/15 1819 11/15/15 0335 11/15/15 1126  HGB 9.7*  --   --  8.1*  --   --  8.6*  --   HCT 31.3*  --   --  25.3*  --   --  27.5*  --   PLT 427  --   --  381  --   --  402  --   APTT 43*  --   --   --   --   --   --   --   LABPROT 17.0*  --   --   --   --   --   --   --   INR 1.37  --   --   --   --   --   --   --   HEPARINUNFRC  --  <0.10*  < > 0.11* 0.30 0.24* 0.25* 0.36  CREATININE 3.24*  --   --  3.35*  --   --  3.60*  --   TROPONINI 18.90* 32.74*  --   --  15.27*  --   --   --   < > = values in this interval not displayed.  Estimated Creatinine Clearance: 29.3 mL/min (by C-G formula based on Cr of 3.6).   Medical History: Past Medical History  Diagnosis Date  . COPD (chronic obstructive pulmonary disease) (HCC)     a. on intermittent home O2  . Diabetes mellitus (HCC)   . Hypertension   . CKD (chronic kidney disease), stage IV (HCC)   . Sleep apnea   . Rhabdomyolysis   . Hyperlipidemia   . Morbid obesity (HCC)   . Depression   . Chronic diastolic CHF (congestive heart failure) (HCC)     a. echo 11/2013: EF 60-65%, mild LVH, mildly increased LV internal cavity size,  moderately increased LV posterior wall thickness; b. echo 1/17: EF 45-50%, severe HK of apical, lateral, & anterolateral myocardium, technically difficult images other regions of WMA cannot be excluded, GR1DD, mild MR, LA mild to mod dilated, PASP nl  . Allergy   . Chronic liver failure (HCC)   . Anemia of chronic disease   . Polysubstance abuse     a. prior abuse of benzos and acid  . PNA (pneumonia)     a. recurrent PNA 10/2015 and 11/2015 leading to acute respiratory failure    Medications:  Infusions:  . heparin 2,700 Units/hr (11/15/15 0700)    Assessment: 60 yo male with respiratory failure and agitation, troponin positive. Pharmacy consulted to dose heparin for ACS. Received [facility] at 0120 will not bolus. Patient initiated on heparin drip at 1000 units (10mL) per hour.  Goal of Therapy:  Heparin level 0.3-0.7 units/ml Monitor platelets by anticoagulation protocol: Yes   Plan:  Heparin level subtherapeutic. Bolus 1500 units IV x 1 and increase rate to 2700 units/hr. Will recheck level in 6 hours.  1/7: Heparin level @ 1130 was 0.36 (therapeutic). Will order another heparin level @ 1930  Jaice Digioia D, Pharm.D., BCPS Clinical Pharmacist 11/15/2015,12:15 PM

## 2015-11-16 ENCOUNTER — Inpatient Hospital Stay: Payer: Commercial Managed Care - HMO

## 2015-11-16 ENCOUNTER — Telehealth: Payer: Self-pay | Admitting: Family Medicine

## 2015-11-16 LAB — BASIC METABOLIC PANEL
Anion gap: 14 (ref 5–15)
BUN: 100 mg/dL — AB (ref 6–20)
CHLORIDE: 94 mmol/L — AB (ref 101–111)
CO2: 26 mmol/L (ref 22–32)
Calcium: 9.1 mg/dL (ref 8.9–10.3)
Creatinine, Ser: 4.11 mg/dL — ABNORMAL HIGH (ref 0.61–1.24)
GFR, EST AFRICAN AMERICAN: 17 mL/min — AB (ref >60)
GFR, EST NON AFRICAN AMERICAN: 15 mL/min — AB (ref >60)
Glucose, Bld: 87 mg/dL (ref 65–99)
POTASSIUM: 4.4 mmol/L (ref 3.5–5.1)
SODIUM: 134 mmol/L — AB (ref 135–145)

## 2015-11-16 LAB — CBC
HCT: 26.9 % — ABNORMAL LOW (ref 40.0–52.0)
HEMOGLOBIN: 8.4 g/dL — AB (ref 13.0–18.0)
MCH: 24.2 pg — AB (ref 26.0–34.0)
MCHC: 31.3 g/dL — ABNORMAL LOW (ref 32.0–36.0)
MCV: 77.5 fL — ABNORMAL LOW (ref 80.0–100.0)
PLATELETS: 354 10*3/uL (ref 150–440)
RBC: 3.47 MIL/uL — AB (ref 4.40–5.90)
RDW: 20.2 % — ABNORMAL HIGH (ref 11.5–14.5)
WBC: 17.5 10*3/uL — AB (ref 3.8–10.6)

## 2015-11-16 LAB — GLUCOSE, CAPILLARY
GLUCOSE-CAPILLARY: 82 mg/dL (ref 65–99)
GLUCOSE-CAPILLARY: 84 mg/dL (ref 65–99)
GLUCOSE-CAPILLARY: 95 mg/dL (ref 65–99)
Glucose-Capillary: 113 mg/dL — ABNORMAL HIGH (ref 65–99)
Glucose-Capillary: 114 mg/dL — ABNORMAL HIGH (ref 65–99)
Glucose-Capillary: 123 mg/dL — ABNORMAL HIGH (ref 65–99)

## 2015-11-16 LAB — HEPARIN LEVEL (UNFRACTIONATED): HEPARIN UNFRACTIONATED: 0.33 [IU]/mL (ref 0.30–0.70)

## 2015-11-16 MED ORDER — HEPARIN SODIUM (PORCINE) 5000 UNIT/ML IJ SOLN
5000.0000 [IU] | Freq: Three times a day (TID) | INTRAMUSCULAR | Status: DC
  Administered 2015-11-16 – 2015-11-18 (×7): 5000 [IU] via SUBCUTANEOUS
  Filled 2015-11-16 (×6): qty 1

## 2015-11-16 NOTE — Progress Notes (Signed)
ANTICOAGULATION CONSULT NOTE - Follow Up Consult  Pharmacy Consult for Heparin drip Indication: chest pain/ACS  Allergies  Allergen Reactions  . Statins Swelling and Other (See Comments)    Reaction:  Rhabdomyolysis  . Ivp Dye [Iodinated Diagnostic Agents] Other (See Comments)    Reaction:  Unknown   . Penicillins Rash and Other (See Comments)    Unable to obtain enough information to answer additional questions about this medication.      Patient Measurements: Height: 5\' 9"  (175.3 cm) Weight: 283 lb 4.7 oz (128.5 kg) IBW/kg (Calculated) : 70.7 Heparin Dosing Weight: 100.4 kg  Vital Signs: Temp: 98.6 F (37 C) (01/08 0400) Temp Source: Axillary (01/08 0400) BP: 121/68 mmHg (01/08 0600) Pulse Rate: 71 (01/08 0600)  Labs:  Recent Labs  11/13/15 1008  11/14/15 0322 11/14/15 1050  11/15/15 0335 11/15/15 1126 11/15/15 1915 11/16/15 0407  HGB  --   < > 8.1*  --   --  8.6*  --   --  8.4*  HCT  --   --  25.3*  --   --  27.5*  --   --  26.9*  PLT  --   --  381  --   --  402  --   --  354  HEPARINUNFRC <0.10*  < > 0.11* 0.30  < > 0.25* 0.36 0.32 0.33  CREATININE  --   --  3.35*  --   --  3.60*  --   --  4.11*  TROPONINI 32.74*  --   --  15.27*  --   --   --   --   --   < > = values in this interval not displayed.  Estimated Creatinine Clearance: 25.7 mL/min (by C-G formula based on Cr of 4.11).   Medications:  Infusions:  . heparin 2,700 Units/hr (11/15/15 2333)    Assessment: 1/7 19:15 Heparin level resulted at 0.32, within therapeutic range.  Goal of Therapy:  Heparin level 0.3-0.7 units/ml Monitor platelets by anticoagulation protocol: Yes   Plan:  Will continue with current rate at check next heparin level and CBC with am labs.  Clovia CuffLisa Kluttz, PharmD, BCPS 11/16/2015 7:03 AM

## 2015-11-16 NOTE — Progress Notes (Signed)
[facility] [place] at [facility]   PATIENT NAME: Carl SheerMichael Perry    MR#:  [id number]  DATE OF BIRTH:  Dec 24, 1955  SUBJECTIVE:   Patient here due to acute respiratory failure from pneumonia and also noted to be Septic.  Also noted to have elevated troponin consistent with a non-ST elevation MI.  On eye flow oxygen through nasal cannula , O2 sats at around 93% . Intermittently on Bipap for significant resp. Distress.  No other acute events overnight, per nursing staff. Patient feels comfortable. Denies any pain. Left extremity swelling is decreasing. Patient is not on diuretics. Vital signs are stable. Kidney function is worse today . Nephrologist recommends temporary hemodialysis, patient refused. Breathing is difficult,. Still requires high flow oxygen through nasal cannulas  REVIEW OF SYSTEMS:    Review of Systems  Constitutional: Negative for fever and chills.  HENT: Negative for congestion and tinnitus.   Eyes: Negative for blurred vision and double vision.  Respiratory: Positive for cough, shortness of breath and wheezing.   Cardiovascular: Negative for chest pain, orthopnea and PND.  Gastrointestinal: Negative for nausea, vomiting, abdominal pain and diarrhea.  Genitourinary: Negative for dysuria and hematuria.  Neurological: Positive for weakness. Negative for dizziness, sensory change and focal weakness.  All other systems reviewed and are negative.   Nutrition: NPO Tolerating Diet: No Tolerating PT: Await Eval   DRUG ALLERGIES:   Allergies  Allergen Reactions  . Statins Swelling and Other (See Comments)    Reaction:  Rhabdomyolysis  . Ivp Dye [Iodinated Diagnostic Agents] Other (See Comments)    Reaction:  Unknown   . Penicillins Rash and Other (See Comments)    Unable to obtain enough information to answer additional questions about this medication.      VITALS:  Blood pressure 117/60, pulse 74, temperature 98.6 F (37 C), temperature  source Axillary, resp. rate 21, height 5\' 9"  (1.753 m), weight 128.5 kg (283 lb 4.7 oz), SpO2 93 %.  PHYSICAL EXAMINATION:   Physical Exam  GENERAL:  60 y.o.-year-old obese patient lying in the bed in mild resp. Distress, on high flow oxygen through nasal cannulas, rattling in the chest is referred from the distance EYES: Pupils equal, round, reactive to light and accommodation. No scleral icterus. Extraocular muscles intact.  HEENT: Head atraumatic, normocephalic. Oropharynx and nasopharynx clear.  NECK:  Supple, no jugular venous distention. No thyroid enlargement, no tenderness.  LUNGS: good a/e b/l, scattered , refills and rhonchi bilaterally. Positive use of accessory muscles of respiration.  CARDIOVASCULAR: S1, S2 tachycardic. No murmurs, rubs, or gallops.  ABDOMEN: Soft, nontender, nondistended. Bowel sounds present. No organomegaly or mass.  EXTREMITIES: No cyanosis, clubbing or +1-2 edema b/l.   NEUROLOGIC: Cranial nerves II through XII are intact. No focal Motor or sensory deficits b/l.  Globally weak PSYCHIATRIC: The patient is alert and oriented x 3.  SKIN: No obvious rash, lesion, or ulcer. Signs of chronic venous stasis bilaterally   LABORATORY PANEL:   CBC  Recent Labs Lab 11/16/15 0407  WBC 17.5*  HGB 8.4*  HCT 26.9*  PLT 354   ------------------------------------------------------------------------------------------------------------------  Chemistries   Recent Labs Lab 11/09/2015 1801  11/16/15 0407  NA 138  < > 134*  K 3.2*  < > 4.4  CL 100*  < > 94*  CO2 22  < > 26  GLUCOSE 190*  < > 87  BUN 69*  < > 100*  CREATININE 2.95*  < > 4.11*  CALCIUM 9.1  < >  9.1  MG 2.1  --   --   AST 18  --   --   ALT 11*  --   --   ALKPHOS 73  --   --   BILITOT 0.6  --   --   < > = values in this interval not displayed. ------------------------------------------------------------------------------------------------------------------  Cardiac Enzymes  Recent  Labs Lab 11/14/15 1050  TROPONINI 15.27*   ------------------------------------------------------------------------------------------------------------------  RADIOLOGY:  Dg Chest 1 View  11/16/2015  CLINICAL DATA:  Dyspnea. History of COPD, diabetes, hypertension, CHF and chronic liver failure. EXAM: CHEST 1 VIEW COMPARISON:  Chest x-rays dated 11/15/2015, 11/14/2015 and 12/08/2015. FINDINGS: Cardiomegaly is unchanged. Dense opacity at the right lung base is not significantly changed in the interval, favored to be pneumonia. Additional vague opacities at the left lung base are also unchanged, most likely atelectasis. No new lung findings. IMPRESSION: 1. Persistent dense airspace opacity at the right lung base, not significantly changed, favored to be pneumonia. 2. Probable atelectasis at the left lung base. 3. Stable cardiomegaly. Electronically Signed   By: Bary Richard M.D.   On: 11/16/2015 09:28   Dg Chest Port 1 View  11/15/2015  CLINICAL DATA:  Respiratory failure. EXAM: PORTABLE CHEST 1 VIEW COMPARISON:  Chest radiograph 11/14/2015. FINDINGS: Stable enlarged cardiac mediastinal contours. Unchanged pulmonary consolidation within the right lower lung. Minimal heterogeneous opacities left lung base. Small right pleural effusion. No pneumothorax. IMPRESSION: Unchanged right-greater-than-left basilar airspace opacities which may represent atelectasis or infection. Small right pleural effusion. Electronically Signed   By: Annia Belt M.D.   On: 11/15/2015 07:10     ASSESSMENT AND PLAN:   60 year old male with past medical history of COPD, diabetes, hypertension, chronic kidney disease stage IV, obstructive sleep apnea, hyperlipidemia, morbid obesity, depression, chronic diastolic CHF, anemia of chronic disease who presented to the hospital due to shortness of breath and noted to be in acute respiratory failure.  #1 acute respiratory failure with hypoxia-this is due to underlying  pneumonia. -Continue IV ceftriaxone, sputum cultures were unremarkable. However  blood cultures grew Streptococcus pneumonia. Continue BiPAP support as needed. -Patient is at high risk for intubation but does not want to be intubated, although is full code.  - Pulm following, input is appreciated.    #2 pneumonia-suspect streptococcal pneumonia, although sputum culture done on 11/13/2015 is normal. Patient's blood cultures are positive for Streptococcus. -Continue IV ceftriaxone. sputum cultures is negative as above.  #3 non-ST elevation MI-patient has ruled in by cardiac markers. He remains clinically asymptomatic though. -Patient likely needs a cardiac catheterization but is high risk given his chronic kidney disease and is not stable from the respiratory standpoint cardiac catheterization presently. -Continue ASA,  heparin subcutaneously. Appreciate cardiology input, and continue medical management. Patient is not on beta blockers , nitroglycerin given his relative hypotension and sepsis.  #4 sepsis due to Streptococcus pneumonia secondary to pneumonia. -Continue IV ceftriaxone. blood cultures which are growing Streptococcus. Clinically stable  #5 acute on chronic renal failure due to ATN-he has baseline CK disease stage IV. He does not want dialysis, even temporarily. - Baseline Cr. 2.6 in 12/16 and today 4.11.  Likely Cardiorenal in nature given his NSTEMI.  Urine output is declining, 735 today , comparatively to 1300 ml yesterday.   -Nephrology has been consulted and continue care as per them.  #6 hypokalemia-resolved  #7 depression - continue Elavil, Effexor.   #8 diabetes type 2 without complication-continue sliding scale insulin.  #9 diabetic neuropathy-continue Neurontin.  All  the records are reviewed and case discussed with Care Management/Social Workerr. Management plans discussed with the patient, family and they are in agreement.  CODE STATUS: Full   DVT Prophylaxis:  Heparin gtt  TOTAL Critical Care TIME TAKING CARE OF THIS PATIENT: 55 minutes.   Discussed with nephrologist, prolonged discussion with patient about temporary hemodialysis indications, risks as well as benefits, time spent approximately 15 minutes.   Katharina Caper M.D on 11/16/2015 at 12:19 PM  Between 7am to 6pm - Pager - [phone]  After 6pm go to [email or web address] - password [email or web address] [facility]  [facility]  Office  [phone]  CC: Primary care physician; Ruel Favors, MD

## 2015-11-16 NOTE — Consult Note (Signed)
PULMONARY PROGRESS NOTE   PT PROFILE: 77 M with DM 2, CAD, CHF, CKD (baseline Cr 2.41) adm 01/04 with acute respiratory failure, cough, purulent sputum of one week duration. CXR revealed RLL consolidation. Admitted with diagnosis of CAP to ICU/SDU for BiPAP after ABG revealed respiratory acidosis. Has ruled in for NSTEMI and has worsening renal function.  MAJOR EVENTS/TEST RESULTS: 01/05 TTE: The estimated ejection fraction was in the range of 45% to 50%. Regional wall motion abnormalities: There appears to be severe hypokinesis of the apical, lateral and anterolateral myocardium 01/05 Troponin I peak @ 33  INDWELLING DEVICES::   MICRO DATA: Resp 01/05 >> inadequate specimen Blood 01/04 >> Pneumococcus  PCT 01/05  43.59, 41.25,   ANTIMICROBIALS:  Aztreonam 01/04 >> 01/05 Levofloxacin 01/04 >> 01/05 Ceftriaxone 01/05 >>   SUBJ:  The patient vomited yesterday, while on BiPAP, this morning he is on nasal cannula high flow. He has moderate dyspnea. He has no other particular complaints today. Yesterday we had discussions with him and his family. We discussed that it would not make sense for the patient to be full code, but not to undergo dialysis to fix her underlying problem, initially he was agreeable to considering DO NOT RESUSCITATE status, as was , however, he is since changed his CODE STATUS back to full code. He is discussing the possibility of dialysis with a nephrologist as well.  Filed Vitals:   11/16/15 0600 11/16/15 0700 11/16/15 0800 11/16/15 0900  BP: 121/68 123/103 117/60   Pulse: 71 75 73 74  Temp:   98.6 F (37 C)   TempSrc:   Axillary   Resp: 15 16 15 21   Height:      Weight:      SpO2: 96% 97% 96% 93%     EXAM:  Gen: He has moderate dyspnea again today HEENT: NCAT, oropharynx normal Neck: Supple without LAN, thyromegaly,  No JVD noted Lungs: R basilar crackles,  Cardiovascular: Reg rhythm, no murmurs noted Abdomen: Soft, nontender, normal BS Ext: severe  chronic stasis changes, symmetric, severe pretibial edema, partial amputation of R great toe Neuro: no focal deficits  DATA:   BMP Latest Ref Rng 11/16/2015 11/15/2015 11/14/2015  Glucose 65 - 99 mg/dL 87 92 86  BUN 6 - 20 mg/dL 161(W) 96(E) 45(W)  Creatinine 0.61 - 1.24 mg/dL 0.98(J) 1.91(Y) 7.82(N)  Sodium 135 - 145 mmol/L 134(L) 133(L) 135  Potassium 3.5 - 5.1 mmol/L 4.4 3.8 3.0(L)  Chloride 101 - 111 mmol/L 94(L) 95(L) 97(L)  CO2 22 - 32 mmol/L 26 25 24   Calcium 8.9 - 10.3 mg/dL 9.1 5.6(O) 8.9    CBC Latest Ref Rng 11/16/2015 11/15/2015 11/14/2015  WBC 3.8 - 10.6 K/uL 17.5(H) 18.6(H) 17.2(H)  Hemoglobin 13.0 - 18.0 g/dL 1.3(Y) 8.6(V) 8.1(L)  Hematocrit 40.0 - 52.0 % 26.9(L) 27.5(L) 25.3(L)  Platelets 150 - 440 K/uL 354 402 381    CXR:  NSC RLL consolidation  IMPRESSION:   Pneumococcal PNA with bacteremia and severe sepsis  His pneumonia is improving, however, his respiratory status does not appear to be improving. Further, likely due to sepsis and renal failure. NSTEMI - Cards following. Deemed stress induced due to primary diagnosis of PNA, severe sepsis Acute hypoxic/hypercarbic respiratory failure  Risk of intubation remains high , I discussed it would not make sense to undergo intubation and not undergo dialysis to fix the underlying problem, initially he thought he would become DO NOT RESUSCITATE, but subsequently he decided to be full code. He is  considering now undergoing dialysis. H/O COPD without acute bronchospasm AKI on CKD (baseline Cr 2.41), nonoliguric  Renal following, nephrology, as discussed with the patient this morning about potentially starting dialysis. They're going to discuss this further and he is going to consider this. Mild hypokalemia - repleted 01/06 DM 2  Episodic hypoglycemia, which now appears to be controlled over the last 24 hours. Chronic anemia without overt acute blood loss Delirium.   PLAN: As above. Continue hi-flow and bipap and prn.  Cont  supplemental O2 to maintain SpO2 92-97% Cont to monitor in ICU/SDU until risk of intubation subsides Monitor BMET intermittently Monitor I/Os   CCM time: 30 mins The above time includes time spent in consultation with patient and/or family members and reviewing care plan on multidisciplinary rounds  Deep Nicholos Johnsamachandran, M.D>  PCCM service   Critical Care Attestation.  I have personally obtained a history, examined the patient, evaluated laboratory and imaging results, formulated the assessment and plan and placed orders. The Patient requires high complexity decision making for assessment and support, frequent evaluation and titration of therapies, application of advanced monitoring technologies and extensive interpretation of multiple databases. The patient has critical illness that could lead imminently to failure of 1 or more organ systems and requires the highest level of physician preparedness to intervene.  Critical Care Time devoted to patient care services described in this note is 35 minutes and is exclusive of time spent in procedures.

## 2015-11-16 NOTE — Telephone Encounter (Signed)
Spoke to BelpreRachel,  Agreed with DNR. Discussed hospice when/if he is discharged.  I will fill FMLA forms for her for the time she will be out of work

## 2015-11-16 NOTE — Progress Notes (Signed)
[facility]  ROUNDING NOTE   Subjective:   Placed on HFNC.  UOP 735 (1300)  Creatinine 4.11 (3.6) (3.35) (3.24) Family still discussing DNR   Objective:  Vital signs in last 24 hours:  Temp:  [98.1 F (36.7 C)-98.6 F (37 C)] 98.6 F (37 C) (01/08 0800) Pulse Rate:  [34-89] 74 (01/08 0900) Resp:  [14-28] 21 (01/08 0900) BP: (104-132)/(50-103) 117/60 mmHg (01/08 0800) SpO2:  [76 %-99 %] 93 % (01/08 0900) FiO2 (%):  [40 %-60 %] 50 % (01/07 1959)  Weight change:  Filed Weights   11/11/2015 1811 11/18/2015 2200  Weight: 120.657 kg (266 lb) 128.5 kg (283 lb 4.7 oz)    Intake/Output: I/O last 3 completed shifts: In: 1884.3 [P.O.:860; I.V.:974.3; IV Piggyback:50] Out: 6387 [Urine:1335]   Intake/Output this shift:  Total I/O In: 3 [I.V.:3] Out: 250 [Urine:250]  Physical Exam: General: Critically ill, in respiratory distress  Head: Normocephalic, atraumatic. Moist oral mucosal membranes  Eyes: Anicteric, PERRL  Neck: Supple, trachea midline  Lungs:  Bilateral wheezing, +HFNC  Heart: Regular rate and rhythm  Abdomen:  Soft, nontender, obese  Extremities: 2+ peripheral edema.  Neurologic: Nonfocal, moving all four extremities  Skin: No lesions  Access: none    Basic Metabolic Panel:  Recent Labs Lab 11/20/2015 1801 11/13/15 0314 11/14/15 0322 11/15/15 0335 11/16/15 0407  NA 138 138 135 133* 134*  K 3.2* 3.3* 3.0* 3.8 4.4  CL 100* 99* 97* 95* 94*  CO2 '22 23 24 25 26  '$ GLUCOSE 190* 121* 86 92 87  BUN 69* 70* 82* 88* 100*  CREATININE 2.95* 3.24* 3.35* 3.60* 4.11*  CALCIUM 9.1 9.1 8.9 8.8* 9.1  MG 2.1  --   --   --   --     Liver Function Tests:  Recent Labs Lab 11/24/2015 1801  AST 18  ALT 11*  ALKPHOS 73  BILITOT 0.6  PROT 8.9*  ALBUMIN 2.6*   No results for input(s): LIPASE, AMYLASE in the last 168 hours. No results for input(s): AMMONIA in the last 168 hours.  CBC:  Recent Labs Lab 11/13/2015 1801 11/13/15 0314 11/14/15 0322  11/15/15 0335 11/16/15 0407  WBC 26.1* 38.7* 17.2* 18.6* 17.5*  NEUTROABS 22.8*  --   --   --   --   HGB 10.6* 9.7* 8.1* 8.6* 8.4*  HCT 34.0* 31.3* 25.3* 27.5* 26.9*  MCV 77.4* 76.0* 75.0* 77.3* 77.5*  PLT 488* 427 381 402 354    Cardiac Enzymes:  Recent Labs Lab 11/14/2015 1801 11/13/15 0033 11/13/15 0314 11/13/15 1008 11/14/15 1050  TROPONINI 1.01* 11.85* 18.90* 32.74* 15.27*    BNP: Invalid input(s): POCBNP  CBG:  Recent Labs Lab 11/15/15 1616 11/15/15 1935 11/15/15 2327 11/16/15 0444 11/16/15 0721  GLUCAP 88 98 86 82 84    Microbiology: Results for orders placed or performed during the hospital encounter of 12/05/2015  Blood culture (routine x 2)     Status: None   Collection Time: 11/09/2015  6:01 PM  Result Value Ref Range Status   Specimen Description BLOOD LEFT HAND  Final   Special Requests BOTTLES DRAWN AEROBIC AND ANAEROBIC 4CC  Final   Culture  Setup Time   Final    GRAM POSITIVE COCCI IN PAIRS IN BOTH AEROBIC AND ANAEROBIC BOTTLES CRITICAL VALUE NOTED.  VALUE IS CONSISTENT WITH PREVIOUSLY REPORTED AND CALLED VALUE.    Culture   Final    STREPTOCOCCUS PNEUMONIAE IN BOTH AEROBIC AND ANAEROBIC BOTTLES    Report Status  11/15/2015 FINAL  Final   Organism ID, Bacteria STREPTOCOCCUS PNEUMONIAE  Final      Susceptibility   Streptococcus pneumoniae - MIC*    ERYTHROMYCIN <=0.12 SENSITIVE Sensitive     VANCOMYCIN 0.5 SENSITIVE Sensitive     TRIMETH/SULFA <=10 SENSITIVE Sensitive     CLINDAMYCIN <=0.25 SENSITIVE Sensitive     CEFTRIAXONE Value in next row Sensitive      SENSITIVE<=0.12    PENICILLIN Value in next row Sensitive      SENSITIVE<=0.06    * STREPTOCOCCUS PNEUMONIAE  Blood culture (routine x 2)     Status: None   Collection Time: 11/10/2015  6:07 PM  Result Value Ref Range Status   Specimen Description BLOOD LEFT ASSIST CONTROL  Final   Special Requests BOTTLES DRAWN AEROBIC AND ANAEROBIC [place]  Final   Culture  Setup Time   Final    GRAM  POSITIVE COCCI IN PAIRS IN BOTH AEROBIC AND ANAEROBIC BOTTLES CRITICAL RESULT CALLED TO, READ BACK BY AND VERIFIED WITH: HANK ZOMPA AT 5784 ON 11/23/15 CTJ    Culture   Final    STREPTOCOCCUS PNEUMONIAE IN BOTH AEROBIC AND ANAEROBIC BOTTLES    Report Status 11/15/2015 FINAL  Final   Organism ID, Bacteria STREPTOCOCCUS PNEUMONIAE  Final      Susceptibility   Streptococcus pneumoniae - MIC*    ERYTHROMYCIN <=0.12 SENSITIVE Sensitive     VANCOMYCIN 0.5 SENSITIVE Sensitive     TRIMETH/SULFA <=10 SENSITIVE Sensitive     CLINDAMYCIN <=0.25 SENSITIVE Sensitive     CEFTRIAXONE Value in next row Sensitive      SENSITIVE<=0.12    PENICILLIN Value in next row Sensitive      SENSITIVE<=0.06    * STREPTOCOCCUS PNEUMONIAE  Blood Culture ID Panel (Reflexed)     Status: Abnormal   Collection Time: 11/10/2015  6:07 PM  Result Value Ref Range Status   Enterococcus species NOT DETECTED NOT DETECTED Final   Listeria monocytogenes NOT DETECTED NOT DETECTED Final   Staphylococcus species NOT DETECTED NOT DETECTED Final   Staphylococcus aureus NOT DETECTED NOT DETECTED Final   Streptococcus species DETECTED (A) NOT DETECTED Corrected    Comment: CRITICAL RESULT CALLED TO, READ BACK BY AND VERIFIED WITH: HANK ZOMPA AT 6962 ON 11/13/15 CTJ CORRECTED ON 01/06 AT 9528: PREVIOUSLY REPORTED AS DETECTED    Streptococcus agalactiae NOT DETECTED NOT DETECTED Final   Streptococcus pneumoniae DETECTED (A) NOT DETECTED Final    Comment: CRITICAL RESULT CALLED TO, READ BACK BY AND VERIFIED WITH: HANK ZOMPA AT 4132 ON 11/13/15 CTJ    Streptococcus pyogenes NOT DETECTED NOT DETECTED Final   Acinetobacter baumannii NOT DETECTED NOT DETECTED Final   Enterobacteriaceae species NOT DETECTED NOT DETECTED Final   Enterobacter cloacae complex NOT DETECTED NOT DETECTED Final   Escherichia coli NOT DETECTED NOT DETECTED Final   Klebsiella oxytoca NOT DETECTED NOT DETECTED Final   Klebsiella pneumoniae NOT DETECTED NOT DETECTED  Final   Proteus species NOT DETECTED NOT DETECTED Final   Serratia marcescens NOT DETECTED NOT DETECTED Final   Haemophilus influenzae NOT DETECTED NOT DETECTED Final   Neisseria meningitidis NOT DETECTED NOT DETECTED Final   Pseudomonas aeruginosa NOT DETECTED NOT DETECTED Final   Candida albicans NOT DETECTED NOT DETECTED Final   Candida glabrata NOT DETECTED NOT DETECTED Final   Candida krusei NOT DETECTED NOT DETECTED Final   Candida parapsilosis NOT DETECTED NOT DETECTED Final   Candida tropicalis NOT DETECTED NOT DETECTED Final   Carbapenem resistance NOT  DETECTED NOT DETECTED Final   Methicillin resistance NOT DETECTED NOT DETECTED Final   Vancomycin resistance NOT DETECTED NOT DETECTED Final  MRSA PCR Screening     Status: None   Collection Time: 12/07/2015 10:01 PM  Result Value Ref Range Status   MRSA by PCR NEGATIVE NEGATIVE Final    Comment:        The GeneXpert MRSA Assay (FDA approved for NASAL specimens only), is one component of a comprehensive MRSA colonization surveillance program. It is not intended to diagnose MRSA infection nor to guide or monitor treatment for MRSA infections.   Culture, expectorated sputum-assessment     Status: None   Collection Time: 11/13/15  2:56 AM  Result Value Ref Range Status   Specimen Description SPUTUM  Final   Special Requests Normal  Final   Sputum evaluation   Final    Sputum specimen not acceptable for testing.  Please recollect.   CALLED BETH Williford AT 3825 11/13/15.PMH   Report Status 11/13/2015 FINAL  Final    Coagulation Studies: No results for input(s): LABPROT, INR in the last 72 hours.  Urinalysis: No results for input(s): COLORURINE, LABSPEC, PHURINE, GLUCOSEU, HGBUR, BILIRUBINUR, KETONESUR, PROTEINUR, UROBILINOGEN, NITRITE, LEUKOCYTESUR in the last 72 hours.  Invalid input(s): APPERANCEUR    Imaging: Dg Chest 1 View  11/16/2015  CLINICAL DATA:  Dyspnea. History of COPD, diabetes, hypertension, CHF and chronic  liver failure. EXAM: CHEST 1 VIEW COMPARISON:  Chest x-rays dated 11/15/2015, 11/14/2015 and 11/17/2015. FINDINGS: Cardiomegaly is unchanged. Dense opacity at the right lung base is not significantly changed in the interval, favored to be pneumonia. Additional vague opacities at the left lung base are also unchanged, most likely atelectasis. No new lung findings. IMPRESSION: 1. Persistent dense airspace opacity at the right lung base, not significantly changed, favored to be pneumonia. 2. Probable atelectasis at the left lung base. 3. Stable cardiomegaly. Electronically Signed   By: Franki Cabot M.D.   On: 11/16/2015 09:28   Dg Chest Port 1 View  11/15/2015  CLINICAL DATA:  Respiratory failure. EXAM: PORTABLE CHEST 1 VIEW COMPARISON:  Chest radiograph 11/14/2015. FINDINGS: Stable enlarged cardiac mediastinal contours. Unchanged pulmonary consolidation within the right lower lung. Minimal heterogeneous opacities left lung base. Small right pleural effusion. No pneumothorax. IMPRESSION: Unchanged right-greater-than-left basilar airspace opacities which may represent atelectasis or infection. Small right pleural effusion. Electronically Signed   By: Lovey Newcomer M.D.   On: 11/15/2015 07:10     Medications:   . heparin 2,700 Units/hr (11/15/15 2333)   . amitriptyline  50 mg Oral QHS  . antiseptic oral rinse  7 mL Mouth Rinse q12n4p  . aspirin EC  81 mg Oral Daily  . cefTRIAXone (ROCEPHIN)  IV  2 g Intravenous Q24H  . chlorhexidine  15 mL Mouth Rinse BID  . gabapentin  600 mg Oral QHS  . gemfibrozil  600 mg Oral BID  . lactulose  10 g Oral BID  . midodrine  5 mg Oral BID WC  . sodium chloride  3 mL Intravenous Q12H  . sodium chloride  3 mL Intravenous Q12H  . venlafaxine XR  225 mg Oral Daily   sodium chloride, acetaminophen **OR** acetaminophen, acetaminophen-codeine, albuterol, baclofen, docusate sodium, ondansetron **OR** ondansetron (ZOFRAN) IV, sodium chloride, sodium chloride  Assessment/  Plan:  Mr. Carl Perry is a 60 y.o. 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   1. Acute Renal Failure on Chronic kidney disease stage IV with nephrotic range proteinuria: baseline creatinine of 2.69, eGFR of 25, 10/21/2015 Acute renal failure from acute illness: acute cardiac syndrome, pneumonia, and hypotension. Doubt this is post strep glomerulonephritis.   Chronic kidney disease secondary with nephrotic syndrome to long standing diabetes mellitus insulin dependent type II, hypertension and nonsteroidal use. Patient's creatinine fluctuates with fluid status due to cardiorenal syndrome. Not a candidate for a renal biopsy - patient has deferred dialysis access placement and currently states he does not want dialysis, even if temporary. Will discuss again with patient and wife later today.  - Currently off enalapril due to advanced renal disease and hyperkalemia. However with hypokalemia on admission. Now potassium corrected.  - continue to hold diuretics.  - Continue monitor urine output, volume status and serum electrolytes.   2. Hypotension with Acute exacerbation of systolic/diastolic CHF with pulmonary edema and acute exacerbation of COPD. with history of anasarca/edema: Blood pressure improved.  His hyponatremia is due to his congestive heart failure.  - Holding torsemide and metolazone. - Continue low salt diet. Continue fluid restriction.  - midodrine - appreciate cardiology input.   3. Pneumonia/sepsis: blood cultures with strep -  ceftriaxone.  - will need a CT-chest when more stable.   4. Diabetes Mellitus type II insulin dependent with chronic kidney disease:  With nephrotic range proteinuria.  - Continue glucose control.   5. Secondary Hyperparathyroidism: PTH 86  On 12/13,  Calcium and phosphorus at goal.  - not currently on an vitamin D agent.  5. Anemia of chronic kidney disease: hemoglobin 8.4 - would not offer epo due to recent ischemia.  - low threshold for PRBC transfusion with recent cardiac ischemia.    LOS: [place], [place] 1/8/201710:24 AM

## 2015-11-17 ENCOUNTER — Inpatient Hospital Stay: Payer: Commercial Managed Care - HMO

## 2015-11-17 LAB — BLOOD GAS, ARTERIAL
ALLENS TEST (PASS/FAIL): POSITIVE — AB
Acid-base deficit: 9.8 mmol/L — ABNORMAL HIGH (ref 0.0–2.0)
Bicarbonate: 19.5 mEq/L — ABNORMAL LOW (ref 21.0–28.0)
FIO2: 0.5
LHR: 20 {breaths}/min
MECHVT: 550 mL
O2 SAT: 92.8 %
PATIENT TEMPERATURE: 37
PCO2 ART: 56 mmHg — AB (ref 32.0–48.0)
PEEP: 5 cmH2O
PO2 ART: 85 mmHg (ref 83.0–108.0)
pH, Arterial: 7.15 — CL (ref 7.350–7.450)

## 2015-11-17 LAB — MAGNESIUM: Magnesium: 2.6 mg/dL — ABNORMAL HIGH (ref 1.7–2.4)

## 2015-11-17 LAB — CBC
HCT: 28.6 % — ABNORMAL LOW (ref 40.0–52.0)
Hemoglobin: 8.9 g/dL — ABNORMAL LOW (ref 13.0–18.0)
MCH: 24.2 pg — AB (ref 26.0–34.0)
MCHC: 31.1 g/dL — ABNORMAL LOW (ref 32.0–36.0)
MCV: 77.9 fL — AB (ref 80.0–100.0)
PLATELETS: 363 10*3/uL (ref 150–440)
RBC: 3.66 MIL/uL — ABNORMAL LOW (ref 4.40–5.90)
RDW: 20.1 % — AB (ref 11.5–14.5)
WBC: 23.9 10*3/uL — AB (ref 3.8–10.6)

## 2015-11-17 LAB — GLUCOSE, CAPILLARY
GLUCOSE-CAPILLARY: 147 mg/dL — AB (ref 65–99)
GLUCOSE-CAPILLARY: 110 mg/dL — AB (ref 65–99)
GLUCOSE-CAPILLARY: 91 mg/dL (ref 65–99)
Glucose-Capillary: 151 mg/dL — ABNORMAL HIGH (ref 65–99)

## 2015-11-17 LAB — BASIC METABOLIC PANEL
ANION GAP: 17 — AB (ref 5–15)
BUN: 114 mg/dL — AB (ref 6–20)
CALCIUM: 8.6 mg/dL — AB (ref 8.9–10.3)
CO2: 23 mmol/L (ref 22–32)
Chloride: 92 mmol/L — ABNORMAL LOW (ref 101–111)
Creatinine, Ser: 4.86 mg/dL — ABNORMAL HIGH (ref 0.61–1.24)
GFR calc Af Amer: 14 mL/min — ABNORMAL LOW (ref >60)
GFR, EST NON AFRICAN AMERICAN: 12 mL/min — AB (ref >60)
GLUCOSE: 149 mg/dL — AB (ref 65–99)
Potassium: 5.1 mmol/L (ref 3.5–5.1)
Sodium: 132 mmol/L — ABNORMAL LOW (ref 135–145)

## 2015-11-17 LAB — PHOSPHORUS: Phosphorus: 12.3 mg/dL — ABNORMAL HIGH (ref 2.5–4.6)

## 2015-11-17 MED ORDER — FAMOTIDINE 20 MG PO TABS
20.0000 mg | ORAL_TABLET | Freq: Two times a day (BID) | ORAL | Status: DC
  Administered 2015-11-17: 20 mg via ORAL
  Filled 2015-11-17: qty 1

## 2015-11-17 MED ORDER — SODIUM CHLORIDE 0.9 % IJ SOLN: 10.0000 mL | INTRAMUSCULAR | Status: DC | PRN

## 2015-11-17 MED ORDER — FENTANYL CITRATE (PF) 100 MCG/2ML IJ SOLN
INTRAMUSCULAR | Status: AC
  Administered 2015-11-17: 200 ug via INTRAVENOUS
  Filled 2015-11-17: qty 4

## 2015-11-17 MED ORDER — FENTANYL 2500MCG IN NS 250ML (10MCG/ML) PREMIX INFUSION
0.0000 ug/h | INTRAVENOUS | Status: DC
  Administered 2015-11-17: 150 ug/h via INTRAVENOUS
  Administered 2015-11-17: 10 ug/h via INTRAVENOUS
  Administered 2015-11-18: 125 ug/h via INTRAVENOUS
  Administered 2015-11-20: 75 ug/h via INTRAVENOUS
  Administered 2015-11-21: 50 ug/h via INTRAVENOUS
  Filled 2015-11-17 (×5): qty 250

## 2015-11-17 MED ORDER — STERILE WATER FOR INJECTION IV SOLN
INTRAVENOUS | Status: DC
  Administered 2015-11-17 – 2015-11-21 (×6): via INTRAVENOUS
  Filled 2015-11-17 (×8): qty 850

## 2015-11-17 MED ORDER — SENNOSIDES-DOCUSATE SODIUM 8.6-50 MG PO TABS
2.0000 | ORAL_TABLET | Freq: Two times a day (BID) | ORAL | Status: DC
  Administered 2015-11-17 – 2015-11-18 (×3): 2 via ORAL
  Filled 2015-11-17 (×3): qty 2

## 2015-11-17 MED ORDER — MIDAZOLAM HCL 2 MG/2ML IJ SOLN
INTRAMUSCULAR | Status: AC
  Administered 2015-11-17: 2 mg via INTRAVENOUS
  Filled 2015-11-17: qty 4

## 2015-11-17 MED ORDER — VECURONIUM BROMIDE 10 MG IV SOLR
10.0000 mg | Freq: Once | INTRAVENOUS | Status: AC
  Administered 2015-11-17: 10 mg via INTRAVENOUS

## 2015-11-17 MED ORDER — MIDAZOLAM HCL 2 MG/2ML IJ SOLN
4.0000 mg | Freq: Once | INTRAMUSCULAR | Status: AC
  Administered 2015-11-17: 2 mg via INTRAVENOUS

## 2015-11-17 MED ORDER — CEFAZOLIN SODIUM-DEXTROSE 2-3 GM-% IV SOLR
2.0000 g | Freq: Two times a day (BID) | INTRAVENOUS | Status: DC
  Administered 2015-11-17 – 2015-11-21 (×9): 2 g via INTRAVENOUS
  Filled 2015-11-17 (×11): qty 50

## 2015-11-17 MED ORDER — IPRATROPIUM-ALBUTEROL 0.5-2.5 (3) MG/3ML IN SOLN
3.0000 mL | RESPIRATORY_TRACT | Status: DC
  Administered 2015-11-17 – 2015-11-21 (×26): 3 mL via RESPIRATORY_TRACT
  Filled 2015-11-17 (×27): qty 3

## 2015-11-17 MED ORDER — PANTOPRAZOLE SODIUM 40 MG PO PACK
40.0000 mg | PACK | Freq: Every day | ORAL | Status: DC
  Filled 2015-11-17: qty 20

## 2015-11-17 MED ORDER — ASPIRIN 81 MG PO CHEW
81.0000 mg | CHEWABLE_TABLET | Freq: Every day | ORAL | Status: DC
  Administered 2015-11-17 – 2015-11-21 (×5): 81 mg via ORAL
  Filled 2015-11-17 (×5): qty 1

## 2015-11-17 MED ORDER — FENTANYL CITRATE (PF) 100 MCG/2ML IJ SOLN
200.0000 ug | Freq: Once | INTRAMUSCULAR | Status: AC
  Administered 2015-11-17: 200 ug via INTRAVENOUS

## 2015-11-17 MED ORDER — VITAL AF 1.2 CAL PO LIQD
1000.0000 mL | ORAL | Status: DC
  Administered 2015-11-17 – 2015-11-19 (×3): 1000 mL

## 2015-11-17 MED ORDER — FREE WATER
30.0000 mL | Status: DC
  Administered 2015-11-17 – 2015-11-21 (×22): 30 mL

## 2015-11-17 MED ORDER — SODIUM CHLORIDE 0.9 % IJ SOLN
10.0000 mL | Freq: Two times a day (BID) | INTRAMUSCULAR | Status: DC
  Administered 2015-11-17 – 2015-11-20 (×6): 10 mL
  Administered 2015-11-21: 40 mL

## 2015-11-17 MED ORDER — VITAL HIGH PROTEIN PO LIQD: 1000.0000 mL | ORAL | Status: DC

## 2015-11-17 MED ORDER — ARTIFICIAL TEARS OP OINT
TOPICAL_OINTMENT | OPHTHALMIC | Status: DC | PRN
  Administered 2015-11-17: 12:00:00 via OPHTHALMIC
  Filled 2015-11-17: qty 3.5

## 2015-11-17 NOTE — Consult Note (Signed)
I have had a long discussion with Wife and Sister, they have expressed to me that patient would NOT want intubation and would NOT want dialysis. They have agreed that patient needs to be DNR.   I have discussed that we will continue current care and management until rest of family arrives, then will proceed with extubation with intent never to re-intubate.  Will obtain Palliative care consult as well.

## 2015-11-17 NOTE — Progress Notes (Signed)
Initial Nutrition Assessment    INTERVENTION:   EN: received verbal order from MD Kasa to start TF; recommend starting Vital 1.2 AF at rate of 20 ml/hr, initial goal rate of 50 ml/hr (1440 kcals, 90 g of protein). Follow renal function, poc. Pt would ultimately benefit from addition of Prostat for additional protein but will hold off at present. Continue to assess Coordination of Care: discussed lack of BM during ICU rounds; bowel regimen initiated  NUTRITION DIAGNOSIS:   Inadequate oral intake related to acute illness as evidenced by NPO status.  GOAL:   Provide needs based on ASPEN/SCCM guidelines  MONITOR:    (Energy Intake, Digestive System, Anthropometrics, Electrolyte/Renal Profile, Glucose Profile, Digestive System)  REASON FOR ASSESSMENT:   Ventilator, Consult Enteral/tube feeding initiation and management  ASSESSMENT:    Pt admitted with acute respiratory failure due to pneumonia, previously on Bipap but required intubation this AM. Pt also with NSTEMI, sepsis, acute on chronic renal failure with possible need for HD  Past Medical History  Diagnosis Date  . COPD (chronic obstructive pulmonary disease) (HCC)     a. on intermittent home O2  . Diabetes mellitus (HCC)   . Hypertension   . CKD (chronic kidney disease), stage IV (HCC)   . Sleep apnea   . Rhabdomyolysis   . Hyperlipidemia   . Morbid obesity (HCC)   . Depression   . Chronic diastolic CHF (congestive heart failure) (HCC)     a. echo 11/2013: EF 60-65%, mild LVH, mildly increased LV internal cavity size, moderately increased LV posterior wall thickness; b. echo 1/17: EF 45-50%, severe HK of apical, lateral, & anterolateral myocardium, technically difficult images other regions of WMA cannot be excluded, GR1DD, mild MR, LA mild to mod dilated, PASP nl  . Allergy   . Chronic liver failure (HCC)   . Anemia of chronic disease   . Polysubstance abuse     a. prior abuse of benzos and acid  . PNA (pneumonia)      a. recurrent PNA 10/2015 and 11/2015 leading to acute respiratory failure     Diet Order:   NPO  Energy Intake: pt has been NPO/CL since admission (day 5) prior to intubation this AM  Skin:  Reviewed, no issues  Last BM:  no BM since admission   Electrolyte and Renal Profile:  Recent Labs Lab 11/20/2015 1801  11/15/15 0335 11/16/15 0407 11/17/15 0405  BUN 69*  < > 88* 100* 114*  CREATININE 2.95*  < > 3.60* 4.11* 4.86*  NA 138  < > 133* 134* 132*  K 3.2*  < > 3.8 4.4 5.1  MG 2.1  --   --   --  2.6*  PHOS  --   --   --   --  12.3*  < > = values in this interval not displayed. Glucose Profile:  Recent Labs  11/16/15 2317 11/17/15 0748 11/17/15 1151  GLUCAP 123* 151* 147*   Nutritional Anemia Profile:  CBC Latest Ref Rng 11/17/2015 11/16/2015 11/15/2015  WBC 3.8 - 10.6 K/uL 23.9(H) 17.5(H) 18.6(H)  Hemoglobin 13.0 - 18.0 g/dL 1.6(X) 0.9(U) 0.4(V)  Hematocrit 40.0 - 52.0 % 28.6(L) 26.9(L) 27.5(L)  Platelets 150 - 440 K/uL 363 354 402    Meds: reviewed  Height:   Ht Readings from Last 1 Encounters:  11/30/2015 5\' 9"  (1.753 m)    Weight:   Wt Readings from Last 1 Encounters:  11/16/2015 283 lb 4.7 oz (128.5 kg)    Wt Readings  from Last 10 Encounters:  12/13/15 283 lb 4.7 oz (128.5 kg)  11/05/15 266 lb (120.657 kg)  11/04/15 271 lb 9.6 oz (123.197 kg)  10/30/15 265 lb (120.203 kg)  10/29/15 283 lb (128.368 kg)  10/23/15 265 lb (120.203 kg)  10/20/15 263 lb (119.296 kg)  10/16/15 267 lb (121.11 kg)  10/14/15 266 lb (120.657 kg)  10/10/15 266 lb (120.657 kg)    BMI:  Body mass index is 41.82 kg/(m^2).  Estimated Nutritional Needs:   Kcal:  1414-1799 kcals (11-14 kcals/kg) using wt of 128.5 kg  Protein:  145-181 g (2.0-2.5 g/kg) using IBW 73 g  Fluid:  1825-2190 mL (25-30 ml/kg)   HIGH Care Level  Romelle Starcherate Quadre Bristol MS, RD, LDN [phone] Pager  [phone] Weekend/On-Call Pager

## 2015-11-17 NOTE — Progress Notes (Signed)
Patient: Carl Perry / Admit Date: 11/13/2015 / Date of Encounter: 11/17/2015, 9:28 AM   Subjective: Intubated on the morning of 1/9. Renal function and white count worsening.   Review of Systems: Review of Systems  Unable to perform ROS: intubated     Objective: Telemetry: NSR, 60's Physical Exam: Blood pressure 126/67, pulse 83, temperature 98.5 F (36.9 C), temperature source Oral, resp. rate 16, height 5\' 9"  (1.753 m), weight 283 lb 4.7 oz (128.5 kg), SpO2 97 %. Body mass index is 41.82 kg/(m^2). General: Well developed, well nourished, intubated. Head: Normocephalic, atraumatic, sclera non-icteric, no xanthomas, nares are without discharge. Neck: Negative for carotid bruits. JVP not elevated. Lungs: Decreased breath sounds bilaterally with expiratory wheezing and rhonchi throughout, intubated. Heart: RRR S1 S2 without murmurs, rubs, or gallops.  Abdomen: Obese, soft, non-tender, non-distended with normoactive bowel sounds. No rebound/guarding. Extremities: No clubbing or cyanosis. 2+ pitting edema with woody appearance.  Neuro: Intubated and sedated. Psych:  Intubated and sedated .    Intake/Output Summary (Last 24 hours) at 11/17/15 0928 Last data filed at 11/17/15 0918  Gross per 24 hour  Intake  565.7 ml  Output      0 ml  Net  565.7 ml    Inpatient Medications:  . amitriptyline  50 mg Oral QHS  . antiseptic oral rinse  7 mL Mouth Rinse q12n4p  . cefTRIAXone (ROCEPHIN)  IV  2 g Intravenous Q24H  . chlorhexidine  15 mL Mouth Rinse BID  . gabapentin  600 mg Oral QHS  . gemfibrozil  600 mg Oral BID  . heparin subcutaneous  5,000 Units Subcutaneous 3 times per day  . lactulose  10 g Oral BID  . midodrine  5 mg Oral BID WC  . sodium chloride  3 mL Intravenous Q12H  . sodium chloride  3 mL Intravenous Q12H  . venlafaxine XR  225 mg Oral Daily   Infusions:  . fentaNYL infusion INTRAVENOUS      Labs:  Recent Labs  11/16/15 0407 11/17/15 0405  NA 134*  132*  K 4.4 5.1  CL 94* 92*  CO2 26 23  GLUCOSE 87 149*  BUN 100* 114*  CREATININE 4.11* 4.86*  CALCIUM 9.1 8.6*  MG  --  2.6*  PHOS  --  12.3*   No results for input(s): AST, ALT, ALKPHOS, BILITOT, PROT, ALBUMIN in the last 72 hours.  Recent Labs  11/16/15 0407 11/17/15 0405  WBC 17.5* 23.9*  HGB 8.4* 8.9*  HCT 26.9* 28.6*  MCV 77.5* 77.9*  PLT 354 363    Recent Labs  11/14/15 1050  TROPONINI 15.27*   Invalid input(s): POCBNP No results for input(s): HGBA1C in the last 72 hours.   Weights: Filed Weights   11/26/2015 1811 11/29/2015 2200  Weight: 266 lb (120.657 kg) 283 lb 4.7 oz (128.5 kg)     Radiology/Studies:  Dg Chest 1 View  11/17/2015  CLINICAL DATA:  60 year old male with shortness of breath on BiPAP. Initial encounter. EXAM: CHEST 1 VIEW COMPARISON:  11/16/2015 and earlier. FINDINGS: Portable AP upright view at 0550 hours. Since 11/04/2015, confluent lung base opacity obscuring the hemidiaphragm. Interval mildly improved ventilation at the left lung base with partial visualization of the left hemidiaphragm. Stable cardiac size and mediastinal contours. No pneumothorax. No pulmonary edema. No large pleural effusion. Visualized tracheal air column is within normal limits. IMPRESSION: Confluent right worsened left lung base opacity. Favor pneumonia. Mildly improved ventilation at the left base since  yesterday. Electronically Signed   By: Odessa Fleming M.D.   On: 11/17/2015 07:48   Dg Chest 1 View  11/16/2015  CLINICAL DATA:  Dyspnea. History of COPD, diabetes, hypertension, CHF and chronic liver failure. EXAM: CHEST 1 VIEW COMPARISON:  Chest x-rays dated 11/15/2015, 11/14/2015 and 11/20/2015. FINDINGS: Cardiomegaly is unchanged. Dense opacity at the right lung base is not significantly changed in the interval, favored to be pneumonia. Additional vague opacities at the left lung base are also unchanged, most likely atelectasis. No new lung findings. IMPRESSION: 1. Persistent  dense airspace opacity at the right lung base, not significantly changed, favored to be pneumonia. 2. Probable atelectasis at the left lung base. 3. Stable cardiomegaly. Electronically Signed   By: Bary Richard M.D.   On: 11/16/2015 09:28   Dg Chest 2 View  11/04/2015  CLINICAL DATA:  Pneumonia. EXAM: CHEST  2 VIEW COMPARISON:  None. FINDINGS: Mediastinum and hilar structures normal. Right lower lobe and middle lobe atelectasis and infiltrate, improved from prior exam. No pleural effusion or pneumothorax. Cardiomegaly with normal pulmonary vascularity. No acute bony abnormality. IMPRESSION: 1. Right middle lobe and right lower lobe atelectasis and infiltrate improved from prior exam . 2. Cardiomegaly. Electronically Signed   By: Maisie Fus  Register   On: 11/04/2015 12:08   Dg Chest Port 1 View  11/15/2015  CLINICAL DATA:  Respiratory failure. EXAM: PORTABLE CHEST 1 VIEW COMPARISON:  Chest radiograph 11/14/2015. FINDINGS: Stable enlarged cardiac mediastinal contours. Unchanged pulmonary consolidation within the right lower lung. Minimal heterogeneous opacities left lung base. Small right pleural effusion. No pneumothorax. IMPRESSION: Unchanged right-greater-than-left basilar airspace opacities which may represent atelectasis or infection. Small right pleural effusion. Electronically Signed   By: Annia Belt M.D.   On: 11/15/2015 07:10   Dg Chest Port 1 View  11/14/2015  CLINICAL DATA:  Respiratory failure. EXAM: PORTABLE CHEST 1 VIEW COMPARISON:  12/06/2015 . FINDINGS: Mediastinum hilar structures normal. Cardiomegaly with mild pulmonary vascular prominence noted. Low lung volumes with prominent bibasilar atelectasis and/or infiltrates/pulmonary edema, right side greater than left. Findings most likely related to basilar pneumonia particularly on the right. Small right pleural effusion. Similar findings noted on prior exam . IMPRESSION: 1. Cardiomegaly with pulmonary vascular congestion. 2. Low lung volumes  with prominent bibasilar atelectasis and/or infiltrates/pulmonary edema, right side greater than left. Findings most likely related to pneumonia particularly in the right lung base. Small right pleural effusion. Similar findings noted on prior exam . Electronically Signed   By: Maisie Fus  Register   On: 11/14/2015 07:55   Dg Chest Portable 1 View  12/04/2015  CLINICAL DATA:  Difficulty breathing for several days. History of COPD. EXAM: PORTABLE CHEST 1 VIEW COMPARISON:  11/04/2015, 12 /6/16 FINDINGS: Normal cardiac silhouette. There is increased airspace density in the RIGHT lower lobe. Low lung volumes. Upper lungs are clear. No pneumothorax. IMPRESSION: RIGHT lower lobe pneumonia has worsened compared to 11/04/2015 and similar to 10/14/2015 suggesting recurrent pneumonia. Recommend follow-up CT to exclude underlying malignant lesion. Electronically Signed   By: Genevive Bi M.D.   On: 11/15/2015 18:36     Assessment and Plan   1. Acute respiratory failure with hypoxia and hypercarbia: -In the setting of recurrent RLL PNA and possibly NSTEMI in the setting of acute infection  -Intubated on the morning of 1/9 -Wean as able  2. NSTEMI: -Never with chest pain -Troponin has trended up to 32 at the time of cardiology consult-->15 -Continues to not be a cardiac cath candidate given patient's  CKD stage IV and refusal to go on long term dialysis, worsening RLL PNA with strep pneumo bacteremia and sepsis, and worsening leukocytosis    -Heparin gtt has been stropped -Echo as below -History of hypotension with SBP into the 70s-90s and acute respiratory failure precludes initiation of beta blockers and nitro at this time -Once his infection resolves he would still need a cardiac cath given the profound troponin elevation of 32. He is likely to have underlying coronary disease given his prior smoking history, he is a diabetic, and morbidly obese -Continue medical management as able for now  3. Severe  strep pneumonia sepsis in the setting of RLL PNA: -Intubated on 1/9 as above -On ABX per IM -Nebs per IM -Consider steroids if indicated   4. Chronic diastolic CHF: -Hypotension precludes Lopressor at this time -Worsening renal function precludes continued usage of torsemide and metolazone at this time -Limit IV fluids  -Echo with EF 45-50%, severe HK of apical, lateral, and anterolateral myocardium. Unable to exclude WMA of remaining walls  5. Ectopic atrial tach: -No clearly defined P waves on ECG -Currently in sinus  6. Acute on CKD stage IV: -Worsening, urine output is decreasing  -Meds on hold as above -Per Renal -Has has continued to decline dialysis which precludes cath as above  7. Leukocytosis/elevated procalcitonin: -Per IM/critical care medicine  -Per #3  8. Hypokalemia: -Resolved   9. NSVT: -No further episodes  -Continue to monitor -High risk of arrhythmia given the above -Hypotension precludes beta blocker -Should he develop increased ventricular ectopy consider short term antiarrhythmic   Signed, Eula Listenyan Deshonda Cryderman, PA-C Pager: [phone] 11/17/2015, 9:28 AM

## 2015-11-17 NOTE — Procedures (Signed)
Central Venous Catheter Placement: Indication: Patient receiving vesicant or irritant drug.; Patient receiving intravenous therapy for longer than 5 days.; Patient has limited or no vascular access.   Consent:emergent  Risks and benefits explained in detail including risk of infection, bleeding, respiratory failure and death..   Hand washing performed prior to starting the procedure.   Procedure: An active timeout was performed and correct patient, name, & ID confirmed.  After explaining risk and benefits, patient was positioned correctly for central venous access. Patient was prepped using strict sterile technique including chlorohexadine preps, sterile drape, sterile gown and sterile gloves.  The area was prepped, draped and anesthetized in the usual sterile manner. Patient comfort was obtained.  A triple lumen catheter was placed in LEFT Internal Jugular Vein There was good blood return, catheter caps were placed on lumens, catheter flushed easily, the line was secured and a sterile dressing and BIO-PATCH applied.   Ultrasound was used to visualize vasculature and guidance of needle.   Number of Attempts: 1 Complications:none Estimated Blood Loss: none Chest Radiograph indicated and ordered.  Operator: Lazer Wollard.   Carl Perry, M.D.  [facility]  Medical Director ICU-[facility] [place] Medical Director [facility] Cardio-P[facility] Department     

## 2015-11-17 NOTE — Procedures (Signed)
Endotracheal Intubation: Patient required placement of an artificial airway secondary to resp failure.   Consent: Emergent.   Hand washing performed prior to starting the procedure.   Medications administered for sedation prior to procedure: Midazolam 2 mg IV,  Vecuronium 10 mg IV, Fentanyl 100 mcg IV.   Procedure: A time out procedure was called and correct patient, name, & ID confirmed. Needed supplies and equipment were assembled and checked to include ETT, 10 ml syringe, Glidescope, Mac and Miller blades, suction, oxygen and bag mask valve, end tidal CO2 monitor. Patient was positioned to align the mouth and pharynx to facilitate visualization of the glottis.  Heart rate, SpO2 and blood pressure was continuously monitored during the procedure. Pre-oxygenation was conducted prior to intubation and endotracheal tube was placed through the vocal cords into the trachea.  During intubation an assistant applied gentle pressure to the cricoid cartilage.   The artificial airway was placed under direct visualization via glidescope route using a 8.0 ETT on the first attempt.    ETT was secured at 23 cm mark.    Placement was confirmed by auscuitation of lungs with good breath sounds bilaterally and no stomach sounds.  Condensation was noted on endotracheal tube.  Pulse ox 91%.  CO2 detector in place with appropriate color change.   Complications: None .   Operator: Nicanor Mendolia.   Chest radiograph ordered and pending.   Comments: OGT placed via glidescope.  Shianna Bally David Armonie Staten, M.D.  [facility]  Medical Director ICU-[facility] [place] Medical Director [facility]io[facility]       

## 2015-11-17 NOTE — Progress Notes (Signed)
[facility]  ROUNDING NOTE   Subjective:   UOP is decreasing 250 last 24 hrs, but 225 cc in folry bag this AM Creatinine increased to 4.86 (4.11) (3.6) (3.35) (3.24) Intubated and placed on ventilator this AM   Objective:  Vital signs in last 24 hours:  Temp:  [98.3 F (36.8 C)-99 F (37.2 C)] 98.5 F (36.9 C) (01/08 1945) Pulse Rate:  [74-196] 83 (01/09 0500) Resp:  [16-30] 16 (01/09 0500) BP: (78-126)/(40-86) 126/67 mmHg (01/09 0500) SpO2:  [84 %-100 %] 97 % (01/09 0500) FiO2 (%):  [60 %-70 %] 70 % (01/08 1608)  Weight change:  Filed Weights   11/11/2015 1811 11/20/2015 2200  Weight: 120.657 kg (266 lb) 128.5 kg (283 lb 4.7 oz)    Intake/Output: I/O last 3 completed shifts: In: 1252.7 [P.O.:760; I.V.:492.7] Out: 650 [Urine:650]   Intake/Output this shift:     Physical Exam: General: Critically ill, in respiratory distress  Head: Normocephalic, atraumatic. Moist oral mucosal membranes  Eyes/ENT Anicteric, ETT  Neck: Supple, trachea midline  Lungs:  Vent assisted  Heart: Regular rate and rhythm  Abdomen:  Soft, nontender, obese  Extremities: 2+ peripheral edema.  Neurologic: Sedated at present  Skin: Venous stasis discoloration over lower legs  Dialysis Access: none    Basic Metabolic Panel:  Recent Labs Lab 11/23/2015 1801 11/13/15 0314 11/14/15 0322 11/15/15 0335 11/16/15 0407 11/17/15 0405  NA 138 138 135 133* 134* 132*  K 3.2* 3.3* 3.0* 3.8 4.4 5.1  CL 100* 99* 97* 95* 94* 92*  CO2 _0 GLUCOSE 190* 121* 86 92 87 149*  BUN 69* 70* 82* 88* 100* 114*  CREATININE 2.95* 3.24* 3.35* 3.60* 4.11* 4.86*  CALCIUM 9.1 9.1 8.9 8.8* 9.1 8.6*  MG 2.1  --   --   --   --  2.6*  PHOS  --   --   --   --   --  12.3*    Liver Function Tests:  Recent Labs Lab 11/23/2015 1801  AST 18  ALT 11*  ALKPHOS 73  BILITOT 0.6  PROT 8.9*  ALBUMIN 2.6*   No results for input(s): LIPASE, AMYLASE in the last 168 hours. No results for  input(s): AMMONIA in the last 168 hours.  CBC:  Recent Labs Lab 12/06/2015 1801 11/13/15 0314 11/14/15 0322 11/15/15 0335 11/16/15 0407 11/17/15 0405  WBC 26.1* 38.7* 17.2* 18.6* 17.5* 23.9*  NEUTROABS 22.8*  --   --   --   --   --   HGB 10.6* 9.7* 8.1* 8.6* 8.4* 8.9*  HCT 34.0* 31.3* 25.3* 27.5* 26.9* 28.6*  MCV 77.4* 76.0* 75.0* 77.3* 77.5* 77.9*  PLT 488* 427 381 402 354 363    Cardiac Enzymes:  Recent Labs Lab 11/29/2015 1801 11/13/15 0033 11/13/15 0314 11/13/15 1008 11/14/15 1050  TROPONINI 1.01* 11.85* 18.90* 32.74* 15.27*    BNP: Invalid input(s): POCBNP  CBG:  Recent Labs Lab 11/16/15 1152 11/16/15 1631 11/16/15 1948 11/16/15 2317 11/17/15 0748  GLUCAP 113* 95 114* 25* 151*    Microbiology: Results for orders placed or performed during the hospital encounter of 11/17/2015  Blood culture (routine x 2)     Status: None   Collection Time: 12/08/2015  6:01 PM  Result Value Ref Range Status   Specimen Description BLOOD LEFT HAND  Final   Special Requests BOTTLES DRAWN AEROBIC AND ANAEROBIC 4CC  Final   Culture  Setup Time   Final    GRAM  POSITIVE COCCI IN PAIRS IN BOTH AEROBIC AND ANAEROBIC BOTTLES CRITICAL VALUE NOTED.  VALUE IS CONSISTENT WITH PREVIOUSLY REPORTED AND CALLED VALUE.    Culture   Final    STREPTOCOCCUS PNEUMONIAE IN BOTH AEROBIC AND ANAEROBIC BOTTLES    Report Status 11/15/2015 FINAL  Final   Organism ID, Bacteria STREPTOCOCCUS PNEUMONIAE  Final      Susceptibility   Streptococcus pneumoniae - MIC*    ERYTHROMYCIN <=0.12 SENSITIVE Sensitive     VANCOMYCIN 0.5 SENSITIVE Sensitive     TRIMETH/SULFA <=10 SENSITIVE Sensitive     CLINDAMYCIN <=0.25 SENSITIVE Sensitive     CEFTRIAXONE Value in next row Sensitive      SENSITIVE<=0.12    PENICILLIN Value in next row Sensitive      SENSITIVE<=0.06    * STREPTOCOCCUS PNEUMONIAE  Blood culture (routine x 2)     Status: None   Collection Time: 11/29/2015  6:07 PM  Result Value Ref Range  Status   Specimen Description BLOOD LEFT ASSIST CONTROL  Final   Special Requests BOTTLES DRAWN AEROBIC AND ANAEROBIC [place]  Final   Culture  Setup Time   Final    GRAM POSITIVE COCCI IN PAIRS IN BOTH AEROBIC AND ANAEROBIC BOTTLES CRITICAL RESULT CALLED TO, READ BACK BY AND VERIFIED WITH: HANK ZOMPA AT 0355 ON 11/23/15 CTJ    Culture   Final    STREPTOCOCCUS PNEUMONIAE IN BOTH AEROBIC AND ANAEROBIC BOTTLES    Report Status 11/15/2015 FINAL  Final   Organism ID, Bacteria STREPTOCOCCUS PNEUMONIAE  Final      Susceptibility   Streptococcus pneumoniae - MIC*    ERYTHROMYCIN <=0.12 SENSITIVE Sensitive     VANCOMYCIN 0.5 SENSITIVE Sensitive     TRIMETH/SULFA <=10 SENSITIVE Sensitive     CLINDAMYCIN <=0.25 SENSITIVE Sensitive     CEFTRIAXONE Value in next row Sensitive      SENSITIVE<=0.12    PENICILLIN Value in next row Sensitive      SENSITIVE<=0.06    * STREPTOCOCCUS PNEUMONIAE  Blood Culture ID Panel (Reflexed)     Status: Abnormal   Collection Time: 11/30/2015  6:07 PM  Result Value Ref Range Status   Enterococcus species NOT DETECTED NOT DETECTED Final   Listeria monocytogenes NOT DETECTED NOT DETECTED Final   Staphylococcus species NOT DETECTED NOT DETECTED Final   Staphylococcus aureus NOT DETECTED NOT DETECTED Final   Streptococcus species DETECTED (A) NOT DETECTED Corrected    Comment: CRITICAL RESULT CALLED TO, READ BACK BY AND VERIFIED WITH: HANK ZOMPA AT 1220 ON 11/13/15 CTJ CORRECTED ON 01/06 AT 9741: PREVIOUSLY REPORTED AS DETECTED    Streptococcus agalactiae NOT DETECTED NOT DETECTED Final   Streptococcus pneumoniae DETECTED (A) NOT DETECTED Final    Comment: CRITICAL RESULT CALLED TO, READ BACK BY AND VERIFIED WITH: HANK ZOMPA AT 6384 ON 11/13/15 CTJ    Streptococcus pyogenes NOT DETECTED NOT DETECTED Final   Acinetobacter baumannii NOT DETECTED NOT DETECTED Final   Enterobacteriaceae species NOT DETECTED NOT DETECTED Final   Enterobacter cloacae complex NOT DETECTED NOT  DETECTED Final   Escherichia coli NOT DETECTED NOT DETECTED Final   Klebsiella oxytoca NOT DETECTED NOT DETECTED Final   Klebsiella pneumoniae NOT DETECTED NOT DETECTED Final   Proteus species NOT DETECTED NOT DETECTED Final   Serratia marcescens NOT DETECTED NOT DETECTED Final   Haemophilus influenzae NOT DETECTED NOT DETECTED Final   Neisseria meningitidis NOT DETECTED NOT DETECTED Final   Pseudomonas aeruginosa NOT DETECTED NOT DETECTED Final   Candida albicans  NOT DETECTED NOT DETECTED Final   Candida glabrata NOT DETECTED NOT DETECTED Final   Candida krusei NOT DETECTED NOT DETECTED Final   Candida parapsilosis NOT DETECTED NOT DETECTED Final   Candida tropicalis NOT DETECTED NOT DETECTED Final   Carbapenem resistance NOT DETECTED NOT DETECTED Final   Methicillin resistance NOT DETECTED NOT DETECTED Final   Vancomycin resistance NOT DETECTED NOT DETECTED Final  MRSA PCR Screening     Status: None   Collection Time: 11/25/2015 10:01 PM  Result Value Ref Range Status   MRSA by PCR NEGATIVE NEGATIVE Final    Comment:        The GeneXpert MRSA Assay (FDA approved for NASAL specimens only), is one component of a comprehensive MRSA colonization surveillance program. It is not intended to diagnose MRSA infection nor to guide or monitor treatment for MRSA infections.   Culture, expectorated sputum-assessment     Status: None   Collection Time: 11/13/15  2:56 AM  Result Value Ref Range Status   Specimen Description SPUTUM  Final   Special Requests Normal  Final   Sputum evaluation   Final    Sputum specimen not acceptable for testing.  Please recollect.   CALLED BETH [place] AT 3825 11/13/15.PMH   Report Status 11/13/2015 FINAL  Final    Coagulation Studies: No results for input(s): LABPROT, INR in the last 72 hours.  Urinalysis: No results for input(s): COLORURINE, LABSPEC, PHURINE, GLUCOSEU, HGBUR, BILIRUBINUR, KETONESUR, PROTEINUR, UROBILINOGEN, NITRITE, LEUKOCYTESUR in the  last 72 hours.  Invalid input(s): APPERANCEUR    Imaging: Dg Chest 1 View  11/17/2015  CLINICAL DATA:  60 year old male with shortness of breath on BiPAP. Initial encounter. EXAM: CHEST 1 VIEW COMPARISON:  11/16/2015 and earlier. FINDINGS: Portable AP upright view at 0550 hours. Since 11/04/2015, confluent lung base opacity obscuring the hemidiaphragm. Interval mildly improved ventilation at the left lung base with partial visualization of the left hemidiaphragm. Stable cardiac size and mediastinal contours. No pneumothorax. No pulmonary edema. No large pleural effusion. Visualized tracheal air column is within normal limits. IMPRESSION: Confluent right worsened left lung base opacity. Favor pneumonia. Mildly improved ventilation at the left base since yesterday. Electronically Signed   By: Genevie Ann M.D.   On: 11/17/2015 07:48   Dg Chest 1 View  11/16/2015  CLINICAL DATA:  Dyspnea. History of COPD, diabetes, hypertension, CHF and chronic liver failure. EXAM: CHEST 1 VIEW COMPARISON:  Chest x-rays dated 11/15/2015, 11/14/2015 and 11/14/2015. FINDINGS: Cardiomegaly is unchanged. Dense opacity at the right lung base is not significantly changed in the interval, favored to be pneumonia. Additional vague opacities at the left lung base are also unchanged, most likely atelectasis. No new lung findings. IMPRESSION: 1. Persistent dense airspace opacity at the right lung base, not significantly changed, favored to be pneumonia. 2. Probable atelectasis at the left lung base. 3. Stable cardiomegaly. Electronically Signed   By: Franki Cabot M.D.   On: 11/16/2015 09:28     Medications:     . amitriptyline  50 mg Oral QHS  . antiseptic oral rinse  7 mL Mouth Rinse q12n4p  . aspirin EC  81 mg Oral Daily  . cefTRIAXone (ROCEPHIN)  IV  2 g Intravenous Q24H  . chlorhexidine  15 mL Mouth Rinse BID  . fentaNYL      . fentaNYL (SUBLIMAZE) injection  200 mcg Intravenous Once  . gabapentin  600 mg Oral QHS  .  gemfibrozil  600 mg Oral BID  . heparin subcutaneous  5,000 Units Subcutaneous 3 times per day  . lactulose  10 g Oral BID  . midazolam      . midazolam  4 mg Intravenous Once  . midodrine  5 mg Oral BID WC  . sodium chloride  3 mL Intravenous Q12H  . sodium chloride  3 mL Intravenous Q12H  . vecuronium  10 mg Intravenous Once  . venlafaxine XR  225 mg Oral Daily   sodium chloride, acetaminophen **OR** acetaminophen, acetaminophen-codeine, albuterol, baclofen, docusate sodium, ondansetron **OR** ondansetron (ZOFRAN) IV, sodium chloride, sodium chloride  Assessment/ Plan:  Mr. Carl Perry is a 60 y.o. 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 Moderate concentric hypertrophy. Systolic function was mildly reduced. The estimated ejection fraction was in the range of 45% to 50%. There appears to be severe hypokinesis of the apical, lateral and anterolateral myocardium  1. Acute Renal Failure on Chronic kidney disease stage IV with nephrotic range proteinuria: baseline creatinine of 2.69, eGFR of 25, 10/21/2015 Acute renal failure from acute illness: acute MI, pneumonia, and hypotension.    Chronic kidney disease secondary with nephrotic syndrome to long standing diabetes mellitus insulin dependent type II, hypertension and nonsteroidal use.  - family will have discussion re: whether to persue dialysis if indicated.   - UOP appears to be improving with better hemodynamic stabilization.  - Will continue to monitor for need of dialysis on an daily basis - continue to hold diuretics.  - Continue monitor urine output, volume status and serum electrolytes.   2. Hypotension with Acute exacerbation of systolic/diastolic CHF with pulmonary edema and acute exacerbation of COPD.  with history of anasarca/edema:   His hyponatremia is likely due to his congestive heart failure and ARF - Holding torsemide and metolazone.    3. Diabetes Mellitus type II insulin dependent with chronic kidney disease:  With nephrotic range proteinuria.  - Continue glucose control.   4. Acute resp failure / Pneumonia / Strep Pneumonia Sepsis - intubated 11/17/15, vent assisted at present - mgmt as per ICU team      LOS: 5 Aziah Kaiser 1/9/20178:52 AM

## 2015-11-17 NOTE — Consult Note (Signed)
Pharmacy Antibiotic Follow-up Note  Carl Perry is a 60 y.o. year-old male admitted on 11/09/2015. The patient is currently on day 4 of ceftriaxone for streptococcus PNA/bacteremia.  Assessment/Plan: In an effort to narrow therapy to cover strep pathogen in blood cultures, d/c ceftriaxone and initiate cefazolin 2g q12hrs due to renal fxn (CrCl 21.7 ml/min).   Temp (24hrs), Avg:98.6 F (37 C), Min:98.3 F (36.8 C), Max:99 F (37.2 C)   Recent Labs Lab 11/13/15 0314 11/14/15 0322 11/15/15 0335 11/16/15 0407 11/17/15 0405  WBC 38.7* 17.2* 18.6* 17.5* 23.9*    Recent Labs Lab 11/13/15 0314 11/14/15 0322 11/15/15 0335 11/16/15 0407 11/17/15 0405  CREATININE 3.24* 3.35* 3.60* 4.11* 4.86*   Estimated Creatinine Clearance: 21.7 mL/min (by C-G formula based on Cr of 4.86).    Allergies  Allergen Reactions  . Statins Swelling and Other (See Comments)    Reaction:  Rhabdomyolysis  . Ivp Dye [Iodinated Diagnostic Agents] Other (See Comments)    Reaction:  Unknown   . Penicillins Rash and Other (See Comments)    Unable to obtain enough information to answer additional questions about this medication.      Antimicrobials this admission: Aztreonam 01/04 >> 01/05 Levofloxacin 01/04 >> 01/05 Ceftriaxone 01/05 >> 01/09 Cefazolin 01/09 >>  Microbiology results: 1/4 BCx: 2 out of 2 positive for streptococcus pneumonaie 1/5 Sputum: Suggest recollection 1/4 MRSA PCR: Negative  Thank you for allowing pharmacy to be a part of this patient's care.  Roque CashAllison Kevon Tench, PharmD Pharmacy Resident 11/17/2015

## 2015-11-17 NOTE — Care Management (Signed)
Patient was intubated this morning.  Palliative care consult placed.  Intensivist spoke with wife and daughter and patient is now DNR

## 2015-11-17 NOTE — Consult Note (Signed)
PULMONARY PROGRESS NOTE   PT PROFILE: 75 M with DM 2, CAD, CHF, CKD (baseline Cr 2.41) adm 01/04 with acute respiratory failure, cough, purulent sputum of one week duration. CXR revealed RLL consolidation. Admitted with diagnosis of CAP to ICU/SDU for BiPAP after ABG revealed respiratory acidosis. Has ruled in for NSTEMI and has worsening renal function.  MAJOR EVENTS/TEST RESULTS: 01/05 TTE: The estimated ejection fraction was in the range of 45% to 50%. Regional wall motion abnormalities: There appears to be severe hypokinesis of the apical, lateral and anterolateral myocardium 01/05 Troponin I peak @ 33  INDWELLING DEVICES::ETT 1/9   MICRO DATA: Resp 01/05 >> inadequate specimen Blood 01/04 >> Pneumococcus  PCT 01/05  43.59, 41.25,   ANTIMICROBIALS:  Aztreonam 01/04 >> 01/05 Levofloxacin 01/04 >> 01/05 Ceftriaxone 01/05 >>   SUBJ:  Patient with severe resp distress, GCS,8 Patient on biPAP using accessory muscles to breathe Patient high risk for cardiac arrest, emergently intubated this AM  Filed Vitals:   11/17/15 0200 11/17/15 0300 11/17/15 0400 11/17/15 0500  BP: 122/75 124/74 125/73 126/67  Pulse: 88 84 82 83  Temp:      TempSrc:      Resp: '19 18 17 16  '$ Height:      Weight:      SpO2: 96% 97% 97% 97%     EXAM:  HEENT: NCAT, oropharynx normal Neck: Supple without LAN, thyromegaly,  No JVD noted Lungs: R basilar crackles, +resp distress Cardiovascular: Reg rhythm, no murmurs noted Abdomen: Soft, nontender, normal BS Ext: severe chronic stasis changes, symmetric, severe pretibial edema, partial amputation of R great toe GCS<8  DATA:   BMP Latest Ref Rng 11/17/2015 11/16/2015 11/15/2015  Glucose 65 - 99 mg/dL 149(H) 87 92  BUN 6 - 20 mg/dL 114(H) 100(H) 88(H)  Creatinine 0.61 - 1.24 mg/dL 4.86(H) 4.11(H) 3.60(H)  Sodium 135 - 145 mmol/L 132(L) 134(L) 133(L)  Potassium 3.5 - 5.1 mmol/L 5.1 4.4 3.8  Chloride 101 - 111 mmol/L 92(L) 94(L) 95(L)  CO2 22 - 32 mmol/L  '23 26 25  '$ Calcium 8.9 - 10.3 mg/dL 8.6(L) 9.1 8.8(L)    CBC Latest Ref Rng 11/17/2015 11/16/2015 11/15/2015  WBC 3.8 - 10.6 K/uL 23.9(H) 17.5(H) 18.6(H)  Hemoglobin 13.0 - 18.0 g/dL 8.9(L) 8.4(L) 8.6(L)  Hematocrit 40.0 - 52.0 % 28.6(L) 26.9(L) 27.5(L)  Platelets 150 - 440 K/uL 363 354 402    CXR:  NSC RLL consolidation  IMPRESSION:  60 yo white male admitted for acute pneumonia now with progressive resp failure and renal failure  Pneumococcal PNA with bacteremia and severe sepsis  Intubated,sedated-placed on full  Vent support NSTEMI - Cards following. Deemed stress induced due to primary diagnosis of PNA, severe sepsis Acute hypoxic/hypercarbic respiratory failure  Now intubated. H/O COPD without acute bronchospasm AKI on CKD (baseline Cr 2.41), nonoliguric  Renal following, nephrology, as discussed with the patient this morning about potentially starting dialysis. They're going to discuss this further and he is going to consider this. Mild hypokalemia - repleted 01/06 DM 2  Episodic hypoglycemia, which now appears to be controlled over the last 24 hours. Chronic anemia without overt acute blood loss Delirium.   PLAN:  1.Respiratory Failure -continue Full MV support -continue Bronchodilator Therapy -Wean Fio2 and PEEP as tolerated -will perform SAT/SBt when respiratory parameters are met  2.Renal Failure-most likely due to ATN -follow chem 7 -follow UO -continue Foley Catheter-assess need -will likely need HD  Will add DVT/GI prx  I have personally obtained a  history, examined the patient, evaluated Pertinent laboratory and RadioGraphic/imaging results, and  formulated the assessment and plan   The Patient requires high complexity decision making for assessment and support, frequent evaluation and titration of therapies, application of advanced monitoring technologies and extensive interpretation of multiple databases. Critical Care Time devoted to patient care services  described in this note is 45 minutes.   Overall, patient is critically ill, prognosis is guarded.  Patient with Multiorgan failure and at high risk for cardiac arrest and death.    Corrin Parker, M.D.  [facility]  Medical Director [place] Director [facility] Cardio-Pulmonary Department

## 2015-11-17 NOTE — Progress Notes (Signed)
eLink Physician-Brief Progress Note Patient Name: Carl HoehnMichael E Perry DOB: 03/30/1956 MRN: [id number]   Date of Service  11/17/2015  HPI/Events of Note  Med review to change per tube requested by RN since intubation  eICU Interventions  Dc tylenol Dc tylenol #3 Dc midodrine Dc zofran Change protonix to tube Continue lactulose via tube  Re gabapentin , baclofen, elvail, effexor, lopid- > consult phrmacy      Intervention Category Intermediate Interventions: Other:  Rosann Gorum 11/17/2015, 7:50 PM

## 2015-11-17 NOTE — Progress Notes (Signed)
[facility] [place] [facility]   PATIENT NAME: Carl SheerMichael Perry    MR#:  [id number]  DATE OF BIRTH:  11/05/1956  SUBJECTIVE:   Patient here due to acute respiratory failure from pneumonia and also noted to be Septic.  Also noted to have elevated troponin consistent with a non-ST elevation MI.  On eye flow oxygen through nasal cannula , O2 sats at around 93%  . Intubated today. Kidney function is worsening, no hemodialysis was wished for the patient and family.   REVIEW OF SYSTEMS:    Review of Systems  Constitutional: Negative for fever and chills.  HENT: Negative for congestion and tinnitus.   Eyes: Negative for blurred vision and double vision.  Respiratory: Positive for cough, shortness of breath and wheezing.   Cardiovascular: Negative for chest pain, orthopnea and PND.  Gastrointestinal: Negative for nausea, vomiting, abdominal pain and diarrhea.  Genitourinary: Negative for dysuria and hematuria.  Neurological: Positive for weakness. Negative for dizziness, sensory change and focal weakness.  All other systems reviewed and are negative.   Nutrition: NPO Tolerating Diet: No Tolerating PT: Await Eval   DRUG ALLERGIES:   Allergies  Allergen Reactions  . Statins Swelling and Other (See Comments)    Reaction:  Rhabdomyolysis  . Ivp Dye [Iodinated Diagnostic Agents] Other (See Comments)    Reaction:  Unknown   . Penicillins Rash and Other (See Comments)    Unable to obtain enough information to answer additional questions about this medication.      VITALS:  Blood pressure 96/51, pulse 66, temperature 97.9 F (36.6 C), temperature source Oral, resp. rate 20, height 5\' 9"  (1.753 m), weight 128.5 kg (283 lb 4.7 oz), SpO2 96 %.  PHYSICAL EXAMINATION:   Physical Exam  GENERAL:  60 y.o.-year-old obese patient lying in the bed in mild resp. Distress, now intubated and sedated EYES: Pupils equal, round, reactive to light and accommodation. No  scleral icterus. Extraocular muscles intact.  HEENT: Head atraumatic, normocephalic. Oropharynx and nasopharynx clear.  NECK:  Supple, no jugular venous distention. No thyroid enlargement, no tenderness.  LUNGS: Some diminished a/e b/l, scattered , rales and rhonchi bilaterally.   CARDIOVASCULAR: S1, S2 tachycardic. No murmurs, rubs, or gallops.  ABDOMEN: Soft, nontender, nondistended. Bowel sounds present. No organomegaly or mass.  EXTREMITIES: No cyanosis, clubbing , 2 edema b/l.   NEUROLOGIC: Cranial nerves II through XII are intact. No focal Motor or sensory deficits b/l.  Globally weak PSYCHIATRIC: The patient is sedated  SKIN: No obvious rash, lesion, or ulcer. Signs of chronic venous stasis bilaterally   LABORATORY PANEL:   CBC  Recent Labs Lab 11/17/15 0405  WBC 23.9*  HGB 8.9*  HCT 28.6*  PLT 363   ------------------------------------------------------------------------------------------------------------------  Chemistries   Recent Labs Lab 15-Feb-2016 1801  11/17/15 0405  NA 138  < > 132*  K 3.2*  < > 5.1  CL 100*  < > 92*  CO2 22  < > 23  GLUCOSE 190*  < > 149*  BUN 69*  < > 114*  CREATININE 2.95*  < > 4.86*  CALCIUM 9.1  < > 8.6*  MG 2.1  --  2.6*  AST 18  --   --   ALT 11*  --   --   ALKPHOS 73  --   --   BILITOT 0.6  --   --   < > = values in this interval not displayed. ------------------------------------------------------------------------------------------------------------------  Cardiac Enzymes  Recent Labs Lab  11/14/15 1050  TROPONINI 15.27*   ------------------------------------------------------------------------------------------------------------------  RADIOLOGY:  Dg Chest 1 View  11/17/2015  CLINICAL DATA:  60 year old male with shortness of breath on BiPAP. Initial encounter. EXAM: CHEST 1 VIEW COMPARISON:  11/16/2015 and earlier. FINDINGS: Portable AP upright view at 0550 hours. Since 11/04/2015, confluent lung base opacity  obscuring the hemidiaphragm. Interval mildly improved ventilation at the left lung base with partial visualization of the left hemidiaphragm. Stable cardiac size and mediastinal contours. No pneumothorax. No pulmonary edema. No large pleural effusion. Visualized tracheal air column is within normal limits. IMPRESSION: Confluent right worsened left lung base opacity. Favor pneumonia. Mildly improved ventilation at the left base since yesterday. Electronically Signed   By: Odessa Fleming M.D.   On: 11/17/2015 07:48   Dg Chest 1 View  11/16/2015  CLINICAL DATA:  Dyspnea. History of COPD, diabetes, hypertension, CHF and chronic liver failure. EXAM: CHEST 1 VIEW COMPARISON:  Chest x-rays dated 11/15/2015, 11/14/2015 and 11/20/2015. FINDINGS: Cardiomegaly is unchanged. Dense opacity at the right lung base is not significantly changed in the interval, favored to be pneumonia. Additional vague opacities at the left lung base are also unchanged, most likely atelectasis. No new lung findings. IMPRESSION: 1. Persistent dense airspace opacity at the right lung base, not significantly changed, favored to be pneumonia. 2. Probable atelectasis at the left lung base. 3. Stable cardiomegaly. Electronically Signed   By: Bary Richard M.D.   On: 11/16/2015 09:28   Dg Chest Port 1 View  11/17/2015  CLINICAL DATA:  Central line placement and intubation and gastric tube placement EXAM: PORTABLE CHEST 1 VIEW COMPARISON:  11/17/2015 FINDINGS: Endotracheal tube in good position Left jugular central venous catheter tip in the SVC. No pneumothorax. Gastric tube is seen in the esophagus with the tip not visualized. Progression of airspace disease on the right with associated right pleural effusion. Bibasilar atelectasis. IMPRESSION: Endotracheal tube in good position. Central venous catheter tip in the SVC without pneumothorax Progression of right pleural effusion and right-sided airspace disease. This may represent asymmetric edema versus  pneumonia. Electronically Signed   By: Marlan Palau M.D.   On: 11/17/2015 11:48   Dg Abd Portable 1v  11/17/2015  CLINICAL DATA:  OG tube placement EXAM: PORTABLE ABDOMEN - 1 VIEW COMPARISON:  None. FINDINGS: 1127 hours. NG tube tip is positioned in the mid stomach. Visualized abdomen has an unremarkable bowel gas pattern. IMPRESSION: NG tube tip in the mid stomach. Electronically Signed   By: Kennith Center M.D.   On: 11/17/2015 11:48     ASSESSMENT AND PLAN:   60 year old male with past medical history of COPD, diabetes, hypertension, chronic kidney disease stage IV, obstructive sleep apnea, hyperlipidemia, morbid obesity, depression, chronic diastolic CHF, anemia of chronic disease who presented to the hospital due to shortness of breath and noted to be in acute respiratory failure.  #1 acute respiratory failure with hypoxia and hypercapnia due to underlying pneumonia. -Continue IV ceftriaxone, sputum cultures were unremarkable. However  blood cultures grew Streptococcus pneumonia. Continue clinical ventilation , patient's extubation will be final, per pulmonologist. No reintubation    #2 pneumonia-suspect streptococcal pneumonia, although sputum culture done on 11/13/2015 is normal. Patient's blood cultures are positive for Streptococcus. -Continue IV ceftriaxone. sputum cultures is negative as above.  #3 non-ST elevation MI-patient has ruled in by cardiac markers. He remains clinically asymptomatic though. Medical therapy was recommended by cardiologist due to renal failure -Continue ASA,  heparin subcutaneously. Appreciate cardiology input, and continue medical management. Patient  is not on beta blockers , nitroglycerin given his hypotension and sepsis.  #4 sepsis due to Streptococcus pneumonia secondary to pneumonia. -Continue IV ceftriaxone. blood cultures which are growing Streptococcus. Clinically stable  #5 acute on chronic renal failure due to ATN-he has baseline CK disease stage  IV. He does not want dialysis, even temporarily. - Baseline Cr. 2.6 in 12/16 and today 4.86.  Likely Cardiorenal in nature given his NSTEMI.  Urine output is declining, 735 yesterday, 250 today   -Nephrology has been consulted and continue care as per them.  #6 hypokalemia-resolved  #7 depression - continue Elavil, Effexor.   #8 diabetes type 2 without complication-continue sliding scale insulin.  #9 diabetic neuropathy-continue Neurontin.  All the records are reviewed and case discussed with Care Management/Social Workerr. Management plans discussed with the patient, family and they are in agreement.  CODE STATUS: Full   DVT Prophylaxis: Heparin gtt  TOTAL Critical Care TIME TAKING CARE OF THIS PATIENT: 55 minutes.   Discussed with nephrologist, discussed this patient's wife, questions answered, time spent approximately 10-15 minutes on updating about condition Rashea Hoskie M.D on 11/17/2015 at 3:49 PM  Between 7am to 6pm - Pager - [phone]  After 6pm go to [email or web address] - password [email or web address]  [facility]  Office  [phone]  CC: Primary care physician; Ruel Favors, MD

## 2015-11-18 LAB — GLUCOSE, CAPILLARY
GLUCOSE-CAPILLARY: 110 mg/dL — AB (ref 65–99)
GLUCOSE-CAPILLARY: 96 mg/dL (ref 65–99)
Glucose-Capillary: 108 mg/dL — ABNORMAL HIGH (ref 65–99)
Glucose-Capillary: 133 mg/dL — ABNORMAL HIGH (ref 65–99)
Glucose-Capillary: 128 mg/dL — ABNORMAL HIGH (ref 65–99)
Glucose-Capillary: 133 mg/dL — ABNORMAL HIGH (ref 65–99)

## 2015-11-18 LAB — BASIC METABOLIC PANEL
ANION GAP: 16 — AB (ref 5–15)
BUN: 138 mg/dL — AB (ref 6–20)
CHLORIDE: 92 mmol/L — AB (ref 101–111)
CO2: 24 mmol/L (ref 22–32)
Calcium: 7.6 mg/dL — ABNORMAL LOW (ref 8.9–10.3)
Creatinine, Ser: 5.8 mg/dL — ABNORMAL HIGH (ref 0.61–1.24)
GFR calc Af Amer: 11 mL/min — ABNORMAL LOW (ref >60)
GFR, EST NON AFRICAN AMERICAN: 10 mL/min — AB (ref >60)
GLUCOSE: 99 mg/dL (ref 65–99)
POTASSIUM: 4 mmol/L (ref 3.5–5.1)
Sodium: 132 mmol/L — ABNORMAL LOW (ref 135–145)

## 2015-11-18 LAB — CBC
HEMATOCRIT: 24.2 % — AB (ref 40.0–52.0)
HEMOGLOBIN: 7.8 g/dL — AB (ref 13.0–18.0)
MCH: 24.4 pg — ABNORMAL LOW (ref 26.0–34.0)
MCHC: 32.3 g/dL (ref 32.0–36.0)
MCV: 75.5 fL — AB (ref 80.0–100.0)
Platelets: 334 10*3/uL (ref 150–440)
RBC: 3.21 MIL/uL — ABNORMAL LOW (ref 4.40–5.90)
RDW: 19.8 % — ABNORMAL HIGH (ref 11.5–14.5)
WBC: 15.5 10*3/uL — ABNORMAL HIGH (ref 3.8–10.6)

## 2015-11-18 LAB — EXPECTORATED SPUTUM ASSESSMENT W GRAM STAIN, RFLX TO RESP C

## 2015-11-18 LAB — EXPECTORATED SPUTUM ASSESSMENT W REFEX TO RESP CULTURE: SPECIAL REQUESTS: NORMAL

## 2015-11-18 MED ORDER — SENNOSIDES-DOCUSATE SODIUM 8.6-50 MG PO TABS
1.0000 | ORAL_TABLET | Freq: Every day | ORAL | Status: DC
  Administered 2015-11-19 – 2015-11-20 (×2): 1 via ORAL
  Filled 2015-11-18: qty 1

## 2015-11-18 MED ORDER — ACETAMINOPHEN 325 MG PO TABS
650.0000 mg | ORAL_TABLET | Freq: Four times a day (QID) | ORAL | Status: DC | PRN
  Administered 2015-11-18: 650 mg via ORAL
  Filled 2015-11-18: qty 2

## 2015-11-18 MED ORDER — PANTOPRAZOLE SODIUM 40 MG IV SOLR
40.0000 mg | Freq: Two times a day (BID) | INTRAVENOUS | Status: DC
  Administered 2015-11-18 – 2015-11-21 (×7): 40 mg via INTRAVENOUS
  Filled 2015-11-18 (×7): qty 40

## 2015-11-18 NOTE — Progress Notes (Signed)
RN spoke with MD, Dr. Belia HemanKasa, in person to clarify O2 sat goals, fentnayl titration, and RASS goal. MD ordered O2 sat goal of 88% and above, RASS goal of -2, and clarified fentanyl titration. MD also ordered no wake up assessment for patient today.

## 2015-11-18 NOTE — Progress Notes (Signed)
[facility]  ROUNDING NOTE   Subjective:   UOP is decreasing 330 cc last 24 hrs,   Creatinine increased to 5.80 from  4.86 (4.11) (3.6) (3.35) (3.24) Intubated and placed on ventilator 1/9   Objective:  Vital signs in last 24 hours:  Temp:  [97.9 F (36.6 C)-99.5 F (37.5 C)] 99.5 F (37.5 C) (01/10 0800) Pulse Rate:  [65-97] 97 (01/10 0800) Resp:  [18-22] 22 (01/10 0800) BP: (88-122)/(49-76) 114/62 mmHg (01/10 0800) SpO2:  [89 %-100 %] 94 % (01/10 0800) FiO2 (%):  [50 %] 50 % (01/10 0800) Weight:  [128.5 kg (283 lb 4.7 oz)] 128.5 kg (283 lb 4.7 oz) (01/09 1300)  Weight change:  Filed Weights   11/27/2015 1811 11/10/2015 2200 11/17/15 1300  Weight: 120.657 kg (266 lb) 128.5 kg (283 lb 4.7 oz) 128.5 kg (283 lb 4.7 oz)    Intake/Output: I/O last 3 completed shifts: In: 430.1 [I.V.:97.3; NG/GT:282.8; IV Piggyback:50] Out: 330 [Urine:330]   Intake/Output this shift:  Total I/O In: -  Out: 100 [Urine:100]  Physical Exam: General: Critically ill, in respiratory distress  Head: Normocephalic,    Eyes/ENT Anicteric, ETT  Neck: Supple, trachea midline  Lungs:  Vent assisted  Heart: Regular rate and rhythm, tachycardic  Abdomen:  Soft, nontender, obese  Extremities: 2+ peripheral edema.  Neurologic: Sedated at present, neuromuscular twitching noted  Skin: Venous stasis discoloration over lower legs  Dialysis Access: none    Basic Metabolic Panel:  Recent Labs Lab 11/23/2015 1801  11/14/15 0322 11/15/15 0335 11/16/15 0407 11/17/15 0405 11/18/15 0516  NA 138  < > 135 133* 134* 132* 132*  K 3.2*  < > 3.0* 3.8 4.4 5.1 4.0  CL 100*  < > 97* 95* 94* 92* 92*  CO2 22  < > '24 25 26 23 24  '$ GLUCOSE 190*  < > 86 92 87 149* 99  BUN 69*  < > 82* 88* 100* 114* 138*  CREATININE 2.95*  < > 3.35* 3.60* 4.11* 4.86* 5.80*  CALCIUM 9.1  < > 8.9 8.8* 9.1 8.6* 7.6*  MG 2.1  --   --   --   --  2.6*  --   PHOS  --   --   --   --   --  12.3*  --   < > = values in this  interval not displayed.  Liver Function Tests:  Recent Labs Lab 11/29/2015 1801  AST 18  ALT 11*  ALKPHOS 73  BILITOT 0.6  PROT 8.9*  ALBUMIN 2.6*   No results for input(s): LIPASE, AMYLASE in the last 168 hours. No results for input(s): AMMONIA in the last 168 hours.  CBC:  Recent Labs Lab 11/24/2015 1801  11/14/15 0322 11/15/15 0335 11/16/15 0407 11/17/15 0405 11/18/15 0516  WBC 26.1*  < > 17.2* 18.6* 17.5* 23.9* 15.5*  NEUTROABS 22.8*  --   --   --   --   --   --   HGB 10.6*  < > 8.1* 8.6* 8.4* 8.9* 7.8*  HCT 34.0*  < > 25.3* 27.5* 26.9* 28.6* 24.2*  MCV 77.4*  < > 75.0* 77.3* 77.5* 77.9* 75.5*  PLT 488*  < > 381 402 354 363 334  < > = values in this interval not displayed.  Cardiac Enzymes:  Recent Labs Lab 11/26/2015 1801 11/13/15 0033 11/13/15 0314 11/13/15 1008 11/14/15 1050  TROPONINI 1.01* 11.85* 18.90* 32.74* 15.27*    BNP: Invalid input(s): POCBNP  CBG:  Recent  Labs Lab 11/17/15 1613 11/17/15 1953 11/18/15 0014 11/18/15 0402 11/18/15 0714  GLUCAP 110* 91 110* 22 108*    Microbiology: Results for orders placed or performed during the hospital encounter of 11/11/2015  Blood culture (routine x 2)     Status: None   Collection Time: 12/05/2015  6:01 PM  Result Value Ref Range Status   Specimen Description BLOOD LEFT HAND  Final   Special Requests BOTTLES DRAWN AEROBIC AND ANAEROBIC 4CC  Final   Culture  Setup Time   Final    GRAM POSITIVE COCCI IN PAIRS IN BOTH AEROBIC AND ANAEROBIC BOTTLES CRITICAL VALUE NOTED.  VALUE IS CONSISTENT WITH PREVIOUSLY REPORTED AND CALLED VALUE.    Culture   Final    STREPTOCOCCUS PNEUMONIAE IN BOTH AEROBIC AND ANAEROBIC BOTTLES    Report Status 11/15/2015 FINAL  Final   Organism ID, Bacteria STREPTOCOCCUS PNEUMONIAE  Final      Susceptibility   Streptococcus pneumoniae - MIC*    ERYTHROMYCIN <=0.12 SENSITIVE Sensitive     VANCOMYCIN 0.5 SENSITIVE Sensitive     TRIMETH/SULFA <=10 SENSITIVE Sensitive      CLINDAMYCIN <=0.25 SENSITIVE Sensitive     CEFTRIAXONE Value in next row Sensitive      SENSITIVE<=0.12    PENICILLIN Value in next row Sensitive      SENSITIVE<=0.06    * STREPTOCOCCUS PNEUMONIAE  Blood culture (routine x 2)     Status: None   Collection Time: 11/25/2015  6:07 PM  Result Value Ref Range Status   Specimen Description BLOOD LEFT ASSIST CONTROL  Final   Special Requests BOTTLES DRAWN AEROBIC AND ANAEROBIC [place]  Final   Culture  Setup Time   Final    GRAM POSITIVE COCCI IN PAIRS IN BOTH AEROBIC AND ANAEROBIC BOTTLES CRITICAL RESULT CALLED TO, READ BACK BY AND VERIFIED WITH: HANK ZOMPA AT 7425 ON 11/23/15 CTJ    Culture   Final    STREPTOCOCCUS PNEUMONIAE IN BOTH AEROBIC AND ANAEROBIC BOTTLES    Report Status 11/15/2015 FINAL  Final   Organism ID, Bacteria STREPTOCOCCUS PNEUMONIAE  Final      Susceptibility   Streptococcus pneumoniae - MIC*    ERYTHROMYCIN <=0.12 SENSITIVE Sensitive     VANCOMYCIN 0.5 SENSITIVE Sensitive     TRIMETH/SULFA <=10 SENSITIVE Sensitive     CLINDAMYCIN <=0.25 SENSITIVE Sensitive     CEFTRIAXONE Value in next row Sensitive      SENSITIVE<=0.12    PENICILLIN Value in next row Sensitive      SENSITIVE<=0.06    * STREPTOCOCCUS PNEUMONIAE  Blood Culture ID Panel (Reflexed)     Status: Abnormal   Collection Time: 11/29/2015  6:07 PM  Result Value Ref Range Status   Enterococcus species NOT DETECTED NOT DETECTED Final   Listeria monocytogenes NOT DETECTED NOT DETECTED Final   Staphylococcus species NOT DETECTED NOT DETECTED Final   Staphylococcus aureus NOT DETECTED NOT DETECTED Final   Streptococcus species DETECTED (A) NOT DETECTED Corrected    Comment: CRITICAL RESULT CALLED TO, READ BACK BY AND VERIFIED WITH: HANK ZOMPA AT 1220 ON 11/13/15 CTJ CORRECTED ON 01/06 AT 9563: PREVIOUSLY REPORTED AS DETECTED    Streptococcus agalactiae NOT DETECTED NOT DETECTED Final   Streptococcus pneumoniae DETECTED (A) NOT DETECTED Final    Comment: CRITICAL  RESULT CALLED TO, READ BACK BY AND VERIFIED WITH: HANK ZOMPA AT 1220 ON 11/13/15 CTJ    Streptococcus pyogenes NOT DETECTED NOT DETECTED Final   Acinetobacter baumannii NOT DETECTED NOT DETECTED Final  Enterobacteriaceae species NOT DETECTED NOT DETECTED Final   Enterobacter cloacae complex NOT DETECTED NOT DETECTED Final   Escherichia coli NOT DETECTED NOT DETECTED Final   Klebsiella oxytoca NOT DETECTED NOT DETECTED Final   Klebsiella pneumoniae NOT DETECTED NOT DETECTED Final   Proteus species NOT DETECTED NOT DETECTED Final   Serratia marcescens NOT DETECTED NOT DETECTED Final   Haemophilus influenzae NOT DETECTED NOT DETECTED Final   Neisseria meningitidis NOT DETECTED NOT DETECTED Final   Pseudomonas aeruginosa NOT DETECTED NOT DETECTED Final   Candida albicans NOT DETECTED NOT DETECTED Final   Candida glabrata NOT DETECTED NOT DETECTED Final   Candida krusei NOT DETECTED NOT DETECTED Final   Candida parapsilosis NOT DETECTED NOT DETECTED Final   Candida tropicalis NOT DETECTED NOT DETECTED Final   Carbapenem resistance NOT DETECTED NOT DETECTED Final   Methicillin resistance NOT DETECTED NOT DETECTED Final   Vancomycin resistance NOT DETECTED NOT DETECTED Final  MRSA PCR Screening     Status: None   Collection Time: 12/06/2015 10:01 PM  Result Value Ref Range Status   MRSA by PCR NEGATIVE NEGATIVE Final    Comment:        The GeneXpert MRSA Assay (FDA approved for NASAL specimens only), is one component of a comprehensive MRSA colonization surveillance program. It is not intended to diagnose MRSA infection nor to guide or monitor treatment for MRSA infections.   Culture, expectorated sputum-assessment     Status: None   Collection Time: 11/13/15  2:56 AM  Result Value Ref Range Status   Specimen Description SPUTUM  Final   Special Requests Normal  Final   Sputum evaluation   Final    Sputum specimen not acceptable for testing.  Please recollect.   CALLED BETH Webster AT  1017 11/13/15.PMH   Report Status 11/13/2015 FINAL  Final  Culture, expectorated sputum-assessment     Status: None (Preliminary result)   Collection Time: 11/17/15  9:57 AM  Result Value Ref Range Status   Specimen Description SPUTUM  Final   Special Requests Normal  Final   Sputum evaluation THIS SPECIMEN IS ACCEPTABLE FOR SPUTUM CULTURE  Final   Report Status PENDING  Incomplete    Coagulation Studies: No results for input(s): LABPROT, INR in the last 72 hours.  Urinalysis: No results for input(s): COLORURINE, LABSPEC, PHURINE, GLUCOSEU, HGBUR, BILIRUBINUR, KETONESUR, PROTEINUR, UROBILINOGEN, NITRITE, LEUKOCYTESUR in the last 72 hours.  Invalid input(s): APPERANCEUR    Imaging: Dg Chest 1 View  11/17/2015  CLINICAL DATA:  60 year old male with shortness of breath on BiPAP. Initial encounter. EXAM: CHEST 1 VIEW COMPARISON:  11/16/2015 and earlier. FINDINGS: Portable AP upright view at 0550 hours. Since 11/04/2015, confluent lung base opacity obscuring the hemidiaphragm. Interval mildly improved ventilation at the left lung base with partial visualization of the left hemidiaphragm. Stable cardiac size and mediastinal contours. No pneumothorax. No pulmonary edema. No large pleural effusion. Visualized tracheal air column is within normal limits. IMPRESSION: Confluent right worsened left lung base opacity. Favor pneumonia. Mildly improved ventilation at the left base since yesterday. Electronically Signed   By: Genevie Ann M.D.   On: 11/17/2015 07:48   Dg Chest Port 1 View  11/17/2015  CLINICAL DATA:  Central line placement and intubation and gastric tube placement EXAM: PORTABLE CHEST 1 VIEW COMPARISON:  11/17/2015 FINDINGS: Endotracheal tube in good position Left jugular central venous catheter tip in the SVC. No pneumothorax. Gastric tube is seen in the esophagus with the tip not visualized. Progression of  airspace disease on the right with associated right pleural effusion. Bibasilar atelectasis.  IMPRESSION: Endotracheal tube in good position. Central venous catheter tip in the SVC without pneumothorax Progression of right pleural effusion and right-sided airspace disease. This may represent asymmetric edema versus pneumonia. Electronically Signed   By: Franchot Gallo M.D.   On: 11/17/2015 11:48   Dg Abd Portable 1v  11/17/2015  CLINICAL DATA:  OG tube placement EXAM: PORTABLE ABDOMEN - 1 VIEW COMPARISON:  None. FINDINGS: 1127 hours. NG tube tip is positioned in the mid stomach. Visualized abdomen has an unremarkable bowel gas pattern. IMPRESSION: NG tube tip in the mid stomach. Electronically Signed   By: Misty Stanley M.D.   On: 11/17/2015 11:48     Medications:   . feeding supplement (VITAL AF 1.2 CAL) 1,000 mL (11/17/15 2200)  . fentaNYL infusion INTRAVENOUS 125 mcg/hr (11/18/15 0850)  .  sodium bicarbonate 150 mEq in sterile water 1000 mL infusion 50 mL/hr at 11/17/15 1740   . amitriptyline  50 mg Oral QHS  . antiseptic oral rinse  7 mL Mouth Rinse q12n4p  . aspirin  81 mg Oral Daily  .  ceFAZolin (ANCEF) IV  2 g Intravenous Q12H  . chlorhexidine  15 mL Mouth Rinse BID  . free water  30 mL Per Tube 6 times per day  . gabapentin  600 mg Oral QHS  . gemfibrozil  600 mg Oral BID  . heparin subcutaneous  5,000 Units Subcutaneous 3 times per day  . ipratropium-albuterol  3 mL Nebulization Q4H  . lactulose  10 g Oral BID  . pantoprazole sodium  40 mg Per Tube Q1200  . senna-docusate  2 tablet Oral BID  . sodium chloride  10-40 mL Intracatheter Q12H  . sodium chloride  3 mL Intravenous Q12H  . sodium chloride  3 mL Intravenous Q12H  . venlafaxine XR  225 mg Oral Daily   sodium chloride, albuterol, artificial tears, baclofen, sodium chloride, sodium chloride, sodium chloride  Assessment/ Plan:  Mr. Carl Perry is a 60 y.o. 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 Moderate concentric hypertrophy. Systolic function was mildly reduced. The estimated ejection fraction was in the range of 45% to 50%. There appears to be severe hypokinesis of the apical, lateral and anterolateral myocardium  1. Acute Renal Failure on Chronic kidney disease stage IV with nephrotic range proteinuria: baseline creatinine of 2.69, eGFR of 25, 10/21/2015 Acute renal failure from acute illness: acute MI, pneumonia, and hypotension.    Chronic kidney disease secondary with nephrotic syndrome to long standing diabetes mellitus insulin dependent type II, hypertension and nonsteroidal use.  - family will have discussion re: whether to persue dialysis if indicated.  So far the consensus of family is that he would not want dialysis - Oliguric - will respect family wishes. No dialysis.  - decreased dose of gabapentin due to renal failure  2. Hypotension with Acute exacerbation of systolic/diastolic CHF with pulmonary edema and acute exacerbation of COPD. with history of anasarca/edema:   His hyponatremia is likely due to his congestive heart failure and ARF - Holding torsemide and metolazone.    3. Diabetes Mellitus type II insulin dependent with chronic kidney disease:  With nephrotic range proteinuria.  - Continue glucose control.   4. Acute resp failure / Pneumonia / Strep  Pneumonia Sepsis - intubated 11/17/15, vent assisted at present - mgmt as per ICU team      LOS: [place] 1/10/20178:56 AM

## 2015-11-18 NOTE — Progress Notes (Signed)
[facility] - [place] at [facility]   PATIENT NAME: Carl Perry    MR#:  [id number]  DATE OF BIRTH:  1956-02-16  SUBJECTIVE:   Patient here due to acute respiratory failure from pneumonia and also noted to be Septic.  Also noted to have elevated troponin consistent with a non-ST elevation MI.  On eye flow oxygen through nasal cannula , O2 sats at around 93%  . Intubated today. Kidney function is worsening, no hemodialysis was wished for the patient and family. Urinary output of 300 cc over the past 24 hours  REVIEW OF SYSTEMS:    Review of Systems  Unable to perform ROS: intubated    Nutrition: NPO Tolerating Diet: No Tolerating PT: Await Eval   DRUG ALLERGIES:   Allergies  Allergen Reactions  . Statins Swelling and Other (See Comments)    Reaction:  Rhabdomyolysis  . Ivp Dye [Iodinated Diagnostic Agents] Other (See Comments)    Reaction:  Unknown   . Penicillins Rash and Other (See Comments)    Unable to obtain enough information to answer additional questions about this medication.      VITALS:  Blood pressure 100/59, pulse 93, temperature 98.2 F (36.8 C), temperature source Oral, resp. rate 21, height 5\' 9"  (1.753 m), weight 128.5 kg (283 lb 4.7 oz), SpO2 93 %.  PHYSICAL EXAMINATION:   Physical Exam  GENERAL:  60 y.o.-year-old obese patient lying in the bed in no significant distress, now intubated and sedated EYES: Pupils equal, round, reactive to light and accommodation. No scleral icterus. Extraocular muscles difficult to obtain.  HEENT: Head atraumatic, normocephalic. Oropharynx and nasopharynx clear. ET tube as well as OG tube are in place, OG tube drains bloody discharge NECK:  Supple, no jugular venous distention. No thyroid enlargement, no tenderness.  LUNGS: Some diminished a/e b/l, scattered , rales and rhonchi bilaterally.   CARDIOVASCULAR: S1, S2 tachycardic. No murmurs, rubs, or gallops.  ABDOMEN: Soft, nontender,  nondistended. Bowel sounds present. No organomegaly or mass.  EXTREMITIES: No cyanosis, clubbing , 2 edema b/l.   NEUROLOGIC: Cranial nerves II through XII are intact. No focal Motor or sensory deficits b/l.  Globally weak PSYCHIATRIC: The patient is sedated  SKIN: No obvious rash, lesion, or ulcer. Signs of chronic venous stasis bilaterally   LABORATORY PANEL:   CBC  Recent Labs Lab 11/18/15 0516  WBC 15.5*  HGB 7.8*  HCT 24.2*  PLT 334   ------------------------------------------------------------------------------------------------------------------  Chemistries   Recent Labs Lab 11/25/2015 1801  11/17/15 0405 11/18/15 0516  NA 138  < > 132* 132*  K 3.2*  < > 5.1 4.0  CL 100*  < > 92* 92*  CO2 22  < > 23 24  GLUCOSE 190*  < > 149* 99  BUN 69*  < > 114* 138*  CREATININE 2.95*  < > 4.86* 5.80*  CALCIUM 9.1  < > 8.6* 7.6*  MG 2.1  --  2.6*  --   AST 18  --   --   --   ALT 11*  --   --   --   ALKPHOS 73  --   --   --   BILITOT 0.6  --   --   --   < > = values in this interval not displayed. ------------------------------------------------------------------------------------------------------------------  Cardiac Enzymes  Recent Labs Lab 11/14/15 1050  TROPONINI 15.27*   ------------------------------------------------------------------------------------------------------------------  RADIOLOGY:  Dg Chest 1 View  11/17/2015  CLINICAL DATA:  60 year old male with shortness  of breath on BiPAP. Initial encounter. EXAM: CHEST 1 VIEW COMPARISON:  11/16/2015 and earlier. FINDINGS: Portable AP upright view at 0550 hours. Since 11/04/2015, confluent lung base opacity obscuring the hemidiaphragm. Interval mildly improved ventilation at the left lung base with partial visualization of the left hemidiaphragm. Stable cardiac size and mediastinal contours. No pneumothorax. No pulmonary edema. No large pleural effusion. Visualized tracheal air column is within normal limits.  IMPRESSION: Confluent right worsened left lung base opacity. Favor pneumonia. Mildly improved ventilation at the left base since yesterday. Electronically Signed   By: Odessa Fleming M.D.   On: 11/17/2015 07:48   Dg Chest Port 1 View  11/17/2015  CLINICAL DATA:  Central line placement and intubation and gastric tube placement EXAM: PORTABLE CHEST 1 VIEW COMPARISON:  11/17/2015 FINDINGS: Endotracheal tube in good position Left jugular central venous catheter tip in the SVC. No pneumothorax. Gastric tube is seen in the esophagus with the tip not visualized. Progression of airspace disease on the right with associated right pleural effusion. Bibasilar atelectasis. IMPRESSION: Endotracheal tube in good position. Central venous catheter tip in the SVC without pneumothorax Progression of right pleural effusion and right-sided airspace disease. This may represent asymmetric edema versus pneumonia. Electronically Signed   By: Marlan Palau M.D.   On: 11/17/2015 11:48   Dg Abd Portable 1v  11/17/2015  CLINICAL DATA:  OG tube placement EXAM: PORTABLE ABDOMEN - 1 VIEW COMPARISON:  None. FINDINGS: 1127 hours. NG tube tip is positioned in the mid stomach. Visualized abdomen has an unremarkable bowel gas pattern. IMPRESSION: NG tube tip in the mid stomach. Electronically Signed   By: Kennith Center M.D.   On: 11/17/2015 11:48     ASSESSMENT AND PLAN:   60 year old male with past medical history of COPD, diabetes, hypertension, chronic kidney disease stage IV, obstructive sleep apnea, hyperlipidemia, morbid obesity, depression, chronic diastolic CHF, anemia of chronic disease who presented to the hospital due to shortness of breath and noted to be in acute respiratory failure.  #1 acute respiratory failure with hypoxia and hypercapnia due to underlying pneumonia. -Continue IV ceftriaxone, sputum cultures were unremarkable. However  blood cultures grew Streptococcus pneumonia. Continue clinical ventilation , patient's  extubation will be terminal, per pulmonologist. No reintubation. Planning on patient's sister is in the hospital.     #2 pneumonia-suspect streptococcal pneumonia, although sputum culture done on 11/13/2015 is normal. Patient's blood cultures are positive for Streptococcus. -Continue IV ceftriaxone. sputum cultures is negative as above.  #3 non-ST elevation MI-patient has ruled in by cardiac markers. He remains clinically asymptomatic though. Medical therapy was recommended by cardiologist due to renal failure -Continue ASA,  heparin subcutaneously. Appreciate cardiology input, and continue medical management. Patient is not on beta blockers , nitroglycerin given his hypotension and sepsis.  #4 sepsis due to Streptococcus pneumonia secondary to pneumonia. -Continue IV ceftriaxone. blood cultures which are growing Streptococcus. Clinically stable  #5 acute on chronic renal failure due to ATN, now oliguric-he has baseline CK disease stage IV. He does not want dialysis, even temporarily. - Baseline Cr. 2.6 in 12/16 and today 5.8.  Likely Cardiorenal in nature given his NSTEMI.  Urine output is declining, 330 over the past 24 hours   -Nephrology has been consulted and continue care as per them.  #6 hypokalemia-resolved  #7 depression - continue Elavil, Effexor.   #8 diabetes type 2 without complication-continue sliding scale insulin.  #9 diabetic neuropathy-continue Neurontin, dose has been decreased.  All the records are reviewed and  case discussed with Care Management/Social Workerr. Management plans discussed with the patient, family and they are in agreement.  CODE STATUS: Full   DVT Prophylaxis: Heparin gtt  TOTAL Critical Care TIME TAKING CARE OF THIS PATIENT: 40 minutes.   Discussed with nephrologist, pulmonologist    Shakyia Bosso M.D on 11/18/2015 at 1:44 PM  Between 7am to 6pm - Pager - [phone]  After 6pm go to [email or web address] - password [email or web address] [facility]  [facility]  Office  [phone]  CC: Primary care physician; Ruel FavorsKrichna F Sowles, MD

## 2015-11-18 NOTE — Progress Notes (Signed)
PHARMACY - CRITICAL CARE PROGRESS NOTE  Pharmacy Consult for cefazolin/constipation prevention Indication: bacteremia     Allergies  Allergen Reactions  . Statins Swelling and Other (See Comments)    Reaction:  Rhabdomyolysis  . Ivp Dye [Iodinated Diagnostic Agents] Other (See Comments)    Reaction:  Unknown   . Penicillins Rash and Other (See Comments)    Unable to obtain enough information to answer additional questions about this medication.      Patient Measurements: Height: 5\' 9"  (175.3 cm) Weight: 287 lb 11.2 oz (130.5 kg) IBW/kg (Calculated) : 70.7   Vital Signs: Temp: 98.2 F (36.8 C) (01/10 1158) Temp Source: Oral (01/10 1158) BP: 101/56 mmHg (01/10 1500) Pulse Rate: 88 (01/10 1500) Intake/Output from previous day: 01/09 0701 - 01/10 0700 In: 1685.1 [I.V.:942.3; NG/GT:642.8; IV Piggyback:100] Out: 330 [Urine:330] Intake/Output from this shift: Total I/O In: 1071 [I.V.:491; NG/GT:530; IV Piggyback:50] Out: 145 [Urine:145] Vent settings for last 24 hours: Vent Mode:  [-] PRVC FiO2 (%):  [50 %] 50 % Set Rate:  [20 bmp] 20 bmp Vt Set:  [550 mL-570 mL] 570 mL PEEP:  [5 cmH20] 5 cmH20 Plateau Pressure:  [19 cmH20-23 cmH20] 23 cmH20  Labs:  Recent Labs  11/16/15 0407 11/17/15 0405 11/18/15 0516  WBC 17.5* 23.9* 15.5*  HGB 8.4* 8.9* 7.8*  HCT 26.9* 28.6* 24.2*  PLT 354 363 334  CREATININE 4.11* 4.86* 5.80*  MG  --  2.6*  --   PHOS  --  12.3*  --    Estimated Creatinine Clearance: 18.3 mL/min (by C-G formula based on Cr of 5.8).   Recent Labs  11/18/15 0402 11/18/15 0714 11/18/15 1109  GLUCAP 96 108* 133*    Microbiology: Recent Results (from the past 720 hour(s))  Blood culture (routine x 2)     Status: None   Collection Time: 2015/12/03  6:01 PM  Result Value Ref Range Status   Specimen Description BLOOD LEFT HAND  Final   Special Requests BOTTLES DRAWN AEROBIC AND ANAEROBIC 4CC  Final   Culture  Setup Time   Final    GRAM POSITIVE COCCI IN  PAIRS IN BOTH AEROBIC AND ANAEROBIC BOTTLES CRITICAL VALUE NOTED.  VALUE IS CONSISTENT WITH PREVIOUSLY REPORTED AND CALLED VALUE.    Culture   Final    STREPTOCOCCUS PNEUMONIAE IN BOTH AEROBIC AND ANAEROBIC BOTTLES    Report Status 11/15/2015 FINAL  Final   Organism ID, Bacteria STREPTOCOCCUS PNEUMONIAE  Final      Susceptibility   Streptococcus pneumoniae - MIC*    ERYTHROMYCIN <=0.12 SENSITIVE Sensitive     VANCOMYCIN 0.5 SENSITIVE Sensitive     TRIMETH/SULFA <=10 SENSITIVE Sensitive     CLINDAMYCIN <=0.25 SENSITIVE Sensitive     CEFTRIAXONE Value in next row Sensitive      SENSITIVE<=0.12    PENICILLIN Value in next row Sensitive      SENSITIVE<=0.06    * STREPTOCOCCUS PNEUMONIAE  Blood culture (routine x 2)     Status: None   Collection Time: 2015/12/03  6:07 PM  Result Value Ref Range Status   Specimen Description BLOOD LEFT ASSIST CONTROL  Final   Special Requests BOTTLES DRAWN AEROBIC AND ANAEROBIC 7CC  Final   Culture  Setup Time   Final    GRAM POSITIVE COCCI IN PAIRS IN BOTH AEROBIC AND ANAEROBIC BOTTLES CRITICAL RESULT CALLED TO, READ BACK BY AND VERIFIED WITH: HANK ZOMPA AT 1220 ON 11/23/15 CTJ    Culture   Final  STREPTOCOCCUS PNEUMONIAE IN BOTH AEROBIC AND ANAEROBIC BOTTLES    Report Status 11/15/2015 FINAL  Final   Organism ID, Bacteria STREPTOCOCCUS PNEUMONIAE  Final      Susceptibility   Streptococcus pneumoniae - MIC*    ERYTHROMYCIN <=0.12 SENSITIVE Sensitive     VANCOMYCIN 0.5 SENSITIVE Sensitive     TRIMETH/SULFA <=10 SENSITIVE Sensitive     CLINDAMYCIN <=0.25 SENSITIVE Sensitive     CEFTRIAXONE Value in next row Sensitive      SENSITIVE<=0.12    PENICILLIN Value in next row Sensitive      SENSITIVE<=0.06    * STREPTOCOCCUS PNEUMONIAE  Blood Culture ID Panel (Reflexed)     Status: Abnormal   Collection Time: 11/09/2015  6:07 PM  Result Value Ref Range Status   Enterococcus species NOT DETECTED NOT DETECTED Final   Listeria monocytogenes NOT DETECTED  NOT DETECTED Final   Staphylococcus species NOT DETECTED NOT DETECTED Final   Staphylococcus aureus NOT DETECTED NOT DETECTED Final   Streptococcus species DETECTED (A) NOT DETECTED Corrected    Comment: CRITICAL RESULT CALLED TO, READ BACK BY AND VERIFIED WITH: HANK ZOMPA AT 1220 ON 11/13/15 CTJ CORRECTED ON 01/06 AT 9811: PREVIOUSLY REPORTED AS DETECTED    Streptococcus agalactiae NOT DETECTED NOT DETECTED Final   Streptococcus pneumoniae DETECTED (A) NOT DETECTED Final    Comment: CRITICAL RESULT CALLED TO, READ BACK BY AND VERIFIED WITH: HANK ZOMPA AT 1220 ON 11/13/15 CTJ    Streptococcus pyogenes NOT DETECTED NOT DETECTED Final   Acinetobacter baumannii NOT DETECTED NOT DETECTED Final   Enterobacteriaceae species NOT DETECTED NOT DETECTED Final   Enterobacter cloacae complex NOT DETECTED NOT DETECTED Final   Escherichia coli NOT DETECTED NOT DETECTED Final   Klebsiella oxytoca NOT DETECTED NOT DETECTED Final   Klebsiella pneumoniae NOT DETECTED NOT DETECTED Final   Proteus species NOT DETECTED NOT DETECTED Final   Serratia marcescens NOT DETECTED NOT DETECTED Final   Haemophilus influenzae NOT DETECTED NOT DETECTED Final   Neisseria meningitidis NOT DETECTED NOT DETECTED Final   Pseudomonas aeruginosa NOT DETECTED NOT DETECTED Final   Candida albicans NOT DETECTED NOT DETECTED Final   Candida glabrata NOT DETECTED NOT DETECTED Final   Candida krusei NOT DETECTED NOT DETECTED Final   Candida parapsilosis NOT DETECTED NOT DETECTED Final   Candida tropicalis NOT DETECTED NOT DETECTED Final   Carbapenem resistance NOT DETECTED NOT DETECTED Final   Methicillin resistance NOT DETECTED NOT DETECTED Final   Vancomycin resistance NOT DETECTED NOT DETECTED Final  MRSA PCR Screening     Status: None   Collection Time: 12/03/2015 10:01 PM  Result Value Ref Range Status   MRSA by PCR NEGATIVE NEGATIVE Final    Comment:        The GeneXpert MRSA Assay (FDA approved for NASAL specimens only),  is one component of a comprehensive MRSA colonization surveillance program. It is not intended to diagnose MRSA infection nor to guide or monitor treatment for MRSA infections.   Culture, expectorated sputum-assessment     Status: None   Collection Time: 11/13/15  2:56 AM  Result Value Ref Range Status   Specimen Description SPUTUM  Final   Special Requests Normal  Final   Sputum evaluation   Final    Sputum specimen not acceptable for testing.  Please recollect.   CALLED BETH BUONO AT 9147 11/13/15.PMH   Report Status 11/13/2015 FINAL  Final  Culture, expectorated sputum-assessment     Status: None   Collection Time: 11/17/15  9:57 AM  Result Value Ref Range Status   Specimen Description SPUTUM  Final   Special Requests Normal  Final   Sputum evaluation THIS SPECIMEN IS ACCEPTABLE FOR SPUTUM CULTURE  Final   Report Status 11/18/2015 FINAL  Final  Culture, respiratory (NON-Expectorated)     Status: None (Preliminary result)   Collection Time: 11/17/15  9:57 AM  Result Value Ref Range Status   Specimen Description SPUTUM  Final   Special Requests Normal Reflexed from [id number]  Final   Gram Stain PENDING  Incomplete   Culture HOLDING FOR POSSIBLE PATHOGEN  Final   Report Status PENDING  Incomplete    Medications:  Scheduled:  . amitriptyline  50 mg Oral QHS  . antiseptic oral rinse  7 mL Mouth Rinse q12n4p  . aspirin  81 mg Oral Daily  .  ceFAZolin (ANCEF) IV  2 g Intravenous Q12H  . chlorhexidine  15 mL Mouth Rinse BID  . free water  30 mL Per Tube 6 times per day  . gabapentin  600 mg Oral QHS  . gemfibrozil  600 mg Oral BID  . ipratropium-albuterol  3 mL Nebulization Q4H  . lactulose  10 g Oral BID  . pantoprazole (PROTONIX) IV  40 mg Intravenous Q12H  . [START ON 11/19/2015] senna-docusate  1 tablet Oral Daily  . sodium chloride  10-40 mL Intracatheter Q12H  . sodium chloride  3 mL Intravenous Q12H  . sodium chloride  3 mL Intravenous Q12H   Infusions:  . feeding  supplement (VITAL AF 1.2 CAL) 1,000 mL (11/18/15 1431)  . fentaNYL infusion INTRAVENOUS 125 mcg/hr (11/18/15 1526)  .  sodium bicarbonate 150 mEq in sterile water 1000 mL infusion 50 mL/hr at 11/18/15 1431    Assessment: Pharmacy consulted for medication management for 60 yo male ICU patient. Patient is currently on day 2 (day 6 of antibiotics) of cefazolin 2g IV Q12hr for pan-sensitive strep pneumonia.   Plan:  1. Cefazolin: Will continue patient on cefazolin 2g IV Q12hr.  2. Constipation: Patient with bowel movement on 1/10. Will decrease senna/docuste to 1 tab po daily.   Pharmacy will continue to monitor and adjust per consult.    Simpson,Rylon L 11/18/2015,3:51 PM

## 2015-11-18 NOTE — Progress Notes (Signed)
Nutrition Follow-up    INTERVENTION:   EN: recommend continuing TF as tolerated until pt extubated, continue minimal free water flushes for tube maintenace   NUTRITION DIAGNOSIS:   Inadequate oral intake related to acute illness as evidenced by NPO status.  GOAL:   Provide needs based on ASPEN/SCCM guidelines  MONITOR:    (Energy Intake, Digestive System, Anthropometrics, Electrolyte/Renal Profile, Glucose Profile, Digestive System)  REASON FOR ASSESSMENT:   Ventilator, Consult Enteral/tube feeding initiation and management  ASSESSMENT:   Pt remains on vent at present, family does not want intubation or dialysis and plan is for extubation with anticipation of comfort care measures once other family members arrive  EN: Vital 1.2 AF infusing at rate of 50 ml/hr  Digestive System: no signs of TF intolerance   Skin:  Reviewed, no issues  Electrolyte and Renal Profile:  Recent Labs Lab July 17, 2016 1801  11/16/15 0407 11/17/15 0405 11/18/15 0516  BUN 69*  < > 100* 114* 138*  CREATININE 2.95*  < > 4.11* 4.86* 5.80*  NA 138  < > 134* 132* 132*  K 3.2*  < > 4.4 5.1 4.0  MG 2.1  --   --  2.6*  --   PHOS  --   --   --  12.3*  --   < > = values in this interval not displayed. Glucose Profile:  Recent Labs  11/18/15 0402 11/18/15 0714 11/18/15 1109  GLUCAP 96 108* 133*   Meds: reviewed  Height:   Ht Readings from Last 1 Encounters:  July 17, 2016 5\' 9"  (1.753 m)    Weight:   Wt Readings from Last 1 Encounters:  11/17/15 283 lb 4.7 oz (128.5 kg)   Filed Weights   July 17, 2016 1811 July 17, 2016 2200 11/17/15 1300  Weight: 266 lb (120.657 kg) 283 lb 4.7 oz (128.5 kg) 283 lb 4.7 oz (128.5 kg)    BMI:  Body mass index is 41.82 kg/(m^2).  Estimated Nutritional Needs:   Kcal:  1414-1799 kcals (11-14 kcals/kg) using wt of 128.5 kg  Protein:  145-181 g (2.0-2.5 g/kg) using IBW 73 g  Fluid:  1825-2190 mL (25-30 ml/kg)   HIGH Care Level  Romelle Starcherate Carra Brindley MS, RD, LDN [phone]  [phone] Pager  [phone] Weekend/On-Call Pager

## 2015-11-18 NOTE — Consult Note (Signed)
PULMONARY PROGRESS NOTE   PT PROFILE: [age] M with DM 2, CAD, CHF, CKD (baseline Cr 2.41) adm 01/04 with acute respiratory failure, cough, purulent sputum of one week duration. CXR revealed RLL consolidation. Admitted with diagnosis of CAP to ICU/SDU for BiPAP after ABG revealed respiratory acidosis. Has ruled in for NSTEMI and has worsening renal function.  MAJOR EVENTS/TEST RESULTS: 01/05 TTE: The estimated ejection fraction was in the range of 45% to 50%. Regional wall motion abnormalities: There appears to be severe hypokinesis of the apical, lateral and anterolateral myocardium 01/05 Troponin I peak @ 33 Patient emergently intubated 1/9 Family dicussion:patient is DNR he would NOT want intubation or dialysis  INDWELLING DEVICES::ETT 1/9   MICRO DATA: Resp 01/05 >> inadequate specimen Blood 01/04 >> Pneumococcus  PCT 01/05  43.59, 41.25,   ANTIMICROBIALS:  Aztreonam 01/04 >> 01/05 Levofloxacin 01/04 >> 01/05 Ceftriaxone 01/05 >>   SUBJ:  GCS<8, intubated,sedated On full vent support fio2 at 60%   Filed Vitals:   11/18/15 0600 11/18/15 0700 11/18/15 0756 11/18/15 0800  BP: 102/55 106/57  114/62  Pulse: 91 92  97  Temp:      TempSrc:      Resp: 18 20  22   Height:      Weight:      SpO2: 94% 94% 89% 94%     EXAM:  HEENT: NCAT, oropharynx normal Neck: Supple without LAN, thyromegaly,  No JVD noted Lungs: R basilar crackles, +resp distress Cardiovascular: Reg rhythm, no murmurs noted Abdomen: Soft, nontender, normal BS Ext: severe chronic stasis changes, symmetric, severe pretibial edema, partial amputation of R great toe GCS<8  DATA:   BMP Latest Ref Rng 11/18/2015 11/17/2015 11/16/2015  Glucose 65 - 99 mg/dL 99 161(W149(H) 87  BUN 6 - 20 mg/dL 960(A138(H) 540(J114(H) 811(B100(H)  Creatinine 0.61 - 1.24 mg/dL 1.47(W5.80(H) 2.95(A4.86(H) 2.13(Y4.11(H)  Sodium 135 - 145 mmol/L 132(L) 132(L) 134(L)  Potassium 3.5 - 5.1 mmol/L 4.0 5.1 4.4  Chloride 101 - 111 mmol/L 92(L) 92(L) 94(L)  CO2 22 - 32 mmol/L  24 23 26   Calcium 8.9 - 10.3 mg/dL 7.6(L) 8.6(L) 9.1    CBC Latest Ref Rng 11/18/2015 11/17/2015 11/16/2015  WBC 3.8 - 10.6 K/uL 15.5(H) 23.9(H) 17.5(H)  Hemoglobin 13.0 - 18.0 g/dL 7.8(L) 8.9(L) 8.4(L)  Hematocrit 40.0 - 52.0 % 24.2(L) 28.6(L) 26.9(L)  Platelets 150 - 440 K/uL 334 363 354    CXR:  NSC RLL consolidation  IMPRESSION:  60 yo white male admitted for acute pneumonia now with progressive resp failure and renal failure  Pneumococcal PNA with bacteremia and severe sepsis  Intubated,sedated-placed on full  Vent support NSTEMI - Cards following. Deemed stress induced due to primary diagnosis of PNA, severe sepsis Acute hypoxic/hypercarbic respiratory failure  Now intubated. H/O COPD without acute bronchospasm AKI on CKD (baseline Cr 2.41), nonoliguric  Renal following, nephrology, as discussed with the patient this morning about potentially starting dialysis. They're going to discuss this further and he is going to consider this. Mild hypokalemia - repleted 01/06 DM 2  Episodic hypoglycemia, which now appears to be controlled over the last 24 hours. Chronic anemia without overt acute blood loss Delirium.   PLAN:  1.Respiratory Failure -continue Full MV support -continue Bronchodilator Therapy -Wean Fio2 and PEEP as tolerated  2.Renal Failure-most likely due to ATN -follow chem 7 -follow UO -continue Foley Catheter-assess need  3.DVT/GI prx   After further discussion with family yesterday, they are waiting for other family memebrs to arrive from out  of town-plan to wean and extubate and anticipate comfort care measures  I have personally obtained a history, examined the patient, evaluated Pertinent laboratory and RadioGraphic/imaging results, and  formulated the assessment and plan   The Patient requires high complexity decision making for assessment and support, frequent evaluation and titration of therapies, application of advanced monitoring technologies and  extensive interpretation of multiple databases. Critical Care Time devoted to patient care services described in this note is 35 minutes.   Overall, patient is critically ill, prognosis is guarded.  Patient with Multiorgan failure and at high risk for cardiac arrest and death.    Lucie Leather, M.D.  [facility]  Medical Director [facility] Medical Director [facility] Cardio-Pulmonary Department

## 2015-11-18 NOTE — Progress Notes (Signed)
RT to room, O2 sat 99% on FIO2 50%, decreased to 40% FIO2, patient tolerating well.

## 2015-11-19 DIAGNOSIS — L8990 Pressure ulcer of unspecified site, unspecified stage: Secondary | ICD-10-CM | POA: Insufficient documentation

## 2015-11-19 LAB — GLUCOSE, CAPILLARY
GLUCOSE-CAPILLARY: 142 mg/dL — AB (ref 65–99)
GLUCOSE-CAPILLARY: 179 mg/dL — AB (ref 65–99)
GLUCOSE-CAPILLARY: 184 mg/dL — AB (ref 65–99)
Glucose-Capillary: 144 mg/dL — ABNORMAL HIGH (ref 65–99)
Glucose-Capillary: 132 mg/dL — ABNORMAL HIGH (ref 65–99)
Glucose-Capillary: 157 mg/dL — ABNORMAL HIGH (ref 65–99)
Glucose-Capillary: 191 mg/dL — ABNORMAL HIGH (ref 65–99)

## 2015-11-19 MED ORDER — INSULIN ASPART 100 UNIT/ML ~~LOC~~ SOLN
0.0000 [IU] | SUBCUTANEOUS | Status: DC
  Administered 2015-11-19 – 2015-11-20 (×5): 2 [IU] via SUBCUTANEOUS
  Administered 2015-11-21: 1 [IU] via SUBCUTANEOUS
  Filled 2015-11-19 (×2): qty 2
  Filled 2015-11-19: qty 1
  Filled 2015-11-19 (×2): qty 2
  Filled 2015-11-19: qty 1
  Filled 2015-11-19: qty 2

## 2015-11-19 NOTE — Progress Notes (Signed)
RN spoke with MD about patient's status and family dynamics. MD ordered RN to hold wake up assessment until family present and he had spoken with them. MD aware of fluctuating neurological status and pt's twitching (patient with poor renal function). No other new orders at this time.

## 2015-11-19 NOTE — Progress Notes (Signed)
   11/19/15 1500  Clinical Encounter Type  Visited With Family  Visit Type Follow-up;Spiritual support  Referral From Family  Consult/Referral To Chaplain  Spiritual Encounters  Spiritual Needs Ritual;Emotional  Stress Factors  Family Stress Factors Major life changes  Chaplain rounded in the unit and reporeted back to the family that we would be willing to perform marriage if all the paper work was received. The family will notify the chaplain as the situation unfolds. Also informed family about availability and obstacles if on-call does the ceremony versus during the unit. We will await the notification from daughter Mykala. Chaplain Rue Tinnel A. Cordelle Dahmen Ext. [phone]

## 2015-11-19 NOTE — Consult Note (Signed)
PULMONARY PROGRESS NOTE   PT PROFILE: 89 M with DM 2, CAD, CHF, CKD (baseline Cr 2.41) adm 01/04 with acute respiratory failure, cough, purulent sputum of one week duration. CXR revealed RLL consolidation. Admitted with diagnosis of CAP to ICU/SDU for BiPAP after ABG revealed respiratory acidosis. Has ruled in for NSTEMI and has worsening renal function.  MAJOR EVENTS/TEST RESULTS: 01/05 TTE: The estimated ejection fraction was in the range of 45% to 50%. Regional wall motion abnormalities: There appears to be severe hypokinesis of the apical, lateral and anterolateral myocardium 01/05 Troponin I peak @ 33 Patient emergently intubated 1/9 Family dicussion:patient is DNR he would NOT want intubation or dialysis There was family drama yesterday per nursing staff  Will need to have family discussion and pursue terminal wean and extubation  INDWELLING DEVICES::ETT 1/9   MICRO DATA: Resp 01/05 >> inadequate specimen Blood 01/04 >> Pneumococcus  PCT 01/05  43.59, 41.25,   ANTIMICROBIALS:  Aztreonam 01/04 >> 01/05 Levofloxacin 01/04 >> 01/05 Ceftriaxone 01/05 >>   SUBJ:  GCS<8, intubated,sedated On full vent support fio2 at 40% -worsening kidney function -now with GIB   Filed Vitals:   11/19/15 0500 11/19/15 0600 11/19/15 0700 11/19/15 0800  BP: 97/56  Pulse: 89 85 87 90  Temp:      TempSrc:      Resp: Height:      Weight:      SpO2: 92% 93% 92% 92%     EXAM:  HEENT: NCAT, oropharynx normal Neck: Supple without LAN, thyromegaly,  No JVD noted Lungs: R basilar crackles, +resp distress Cardiovascular: Reg rhythm, no murmurs noted Abdomen: Soft, nontender, normal BS Ext: severe chronic stasis changes, symmetric, severe pretibial edema, partial amputation of R great toe GCS<8  DATA:   BMP Latest Ref Rng 11/18/2015 11/17/2015 11/16/2015  Glucose 65 - 99 mg/dL 99 161(W) 87  BUN 6 - 20 mg/dL 960(A) 540(J) 811(B)  Creatinine 0.61 - 1.24 mg/dL  1.47(W) 2.95(A) 2.13(Y)  Sodium 135 - 145 mmol/L 132(L) 132(L) 134(L)  Potassium 3.5 - 5.1 mmol/L 4.0 5.1 4.4  Chloride 101 - 111 mmol/L 92(L) 92(L) 94(L)  CO2 22 - 32 mmol/L Calcium 8.9 - 10.3 mg/dL 7.6(L) 8.6(L) 9.1    CBC Latest Ref Rng 11/18/2015 11/17/2015 11/16/2015  WBC 3.8 - 10.6 K/uL 15.5(H) 23.9(H) 17.5(H)  Hemoglobin 13.0 - 18.0 g/dL 7.8(L) 8.9(L) 8.4(L)  Hematocrit 40.0 - 52.0 % 24.2(L) 28.6(L) 26.9(L)  Platelets 150 - 440 K/uL 334 363 354    CXR:  NSC RLL consolidation  IMPRESSION:  60 yo white male admitted for acute strep pneumonia with bacteremia complicated by NSTEMI now with progressive resp failure and renal failure  Pneumococcal PNA with bacteremia and severe sepsis  Intubated,sedated-placed on full  Vent support NSTEMI - Cards following. Deemed stress induced due to primary diagnosis of PNA, severe sepsis Acute hypoxic/hypercarbic respiratory failure  Now intubated. H/O COPD without acute bronchospasm AKI on CKD (baseline Cr 2.41), nonoliguric  Renal following, nephrology, as discussed with the patient this morning about potentially starting dialysis. They're going to discuss this further and he is going to consider this. Mild hypokalemia - repleted 01/06 DM 2  Episodic hypoglycemia, which now appears to be controlled over the last 24 hours. Chronic anemia without overt acute blood loss Delirium.   PLAN:  1.Respiratory Failure-plan for terminal wean and extubation after family discussion -continue Full MV support -continue Bronchodilator Therapy -Wean Fio2 and  PEEP as tolerated  2.Renal Failure-most likely due to ATN -follow chem 7 -follow UO -continue Foley Catheter-assess need  3.DVT/GI prx   After further discussion with family yesterday, they are waiting for other family memebrs to arrive from out of town-plan to wean and extubate and anticipate comfort care measures  I have personally obtained a history, examined the patient, evaluated  Pertinent laboratory and RadioGraphic/imaging results, and  formulated the assessment and plan   The Patient requires high complexity decision making for assessment and support, frequent evaluation and titration of therapies, application of advanced monitoring technologies and extensive interpretation of multiple databases. Critical Care Time devoted to patient care services described in this note is 35 minutes.   Overall, patient is critically ill, prognosis is guarded.  Patient with Multiorgan failure and at high risk for cardiac arrest and death.    Lucie Leather, M.D.  [facility]  Medical Director [facility] Medical Director [facility] Cardio-Pulmonary Department

## 2015-11-19 NOTE — Care Management (Signed)
Patient's condition continues to decline and there is discussion of terminal extubation.

## 2015-11-19 NOTE — Progress Notes (Signed)
[facility] - [place] at [facility]   PATIENT NAME: Carl Perry    MR#:  [id number]  DATE OF BIRTH:  28-Jul-1956  SUBJECTIVE:   Patient here due to acute respiratory failure from pneumonia and also noted to be Septic.  Also noted to have elevated troponin consistent with a non-ST elevation MI.  Intubated, on 40% FiO2, O2 sats at around 93%  . Intubated today. Kidney function is worsening, no hemodialysis was wished for the patient and family. Urinary output of 300 cc over the past 24 hours  REVIEW OF SYSTEMS:    Review of Systems  Unable to perform ROS: intubated    Nutrition: NPO Tolerating Diet: No Tolerating PT: Await Eval   DRUG ALLERGIES:   Allergies  Allergen Reactions  . Statins Swelling and Other (See Comments)    Reaction:  Rhabdomyolysis  . Ivp Dye [Iodinated Diagnostic Agents] Other (See Comments)    Reaction:  Unknown   . Penicillins Rash and Other (See Comments)    Unable to obtain enough information to answer additional questions about this medication.      VITALS:  Blood pressure 102/63, pulse 94, temperature 100.2 F (37.9 C), temperature source Oral, resp. rate 21, height 5\' 9"  (1.753 m), weight 131.5 kg (289 lb 14.5 oz), SpO2 92 %.  PHYSICAL EXAMINATION:   Physical Exam  GENERAL:  60 y.o.-year-old obese patient lying in the bed in no significant distress, now intubated and sedated, pale gray-looking complexion, intermittent different body parts myoclonus  EYES: Pupils equal, round, reactive to light and accommodation. No scleral icterus. Extraocular muscles difficult to obtain.  HEENT: Head atraumatic, normocephalic. Oropharynx and nasopharynx clear. ET tube as well as OG tube are in place,  NECK:  Supple, no jugular venous distention. No thyroid enlargement, no tenderness.  LUNGS: Some diminished a/e b/l, scattered , rales and rhonchi bilaterally.   CARDIOVASCULAR: S1, S2 tachycardic. No murmurs, rubs, or gallops.  ABDOMEN:  Soft, nontender, nondistended. Bowel sounds present. No organomegaly or mass.  EXTREMITIES: No cyanosis, clubbing , 3+ edema b/l.   NEUROLOGIC: Cranial nerves unable to evaluate. Myoclonus PSYCHIATRIC: The patient is off sedation, not responding to verbal stimuli, briefly opens eyes and wonders around SKIN: No obvious rash, lesion, or ulcer. Signs of chronic venous stasis bilaterally   LABORATORY PANEL:   CBC  Recent Labs Lab 11/18/15 0516  WBC 15.5*  HGB 7.8*  HCT 24.2*  PLT 334   ------------------------------------------------------------------------------------------------------------------  Chemistries   Recent Labs Lab November 14, 2015 1801  11/17/15 0405 11/18/15 0516  NA 138  < > 132* 132*  K 3.2*  < > 5.1 4.0  CL 100*  < > 92* 92*  CO2 22  < > 23 24  GLUCOSE 190*  < > 149* 99  BUN 69*  < > 114* 138*  CREATININE 2.95*  < > 4.86* 5.80*  CALCIUM 9.1  < > 8.6* 7.6*  MG 2.1  --  2.6*  --   AST 18  --   --   --   ALT 11*  --   --   --   ALKPHOS 73  --   --   --   BILITOT 0.6  --   --   --   < > = values in this interval not displayed. ------------------------------------------------------------------------------------------------------------------  Cardiac Enzymes  Recent Labs Lab 11/14/15 1050  TROPONINI 15.27*   ------------------------------------------------------------------------------------------------------------------  RADIOLOGY:  No results found.   ASSESSMENT AND PLAN:   60 year old male with  past medical history of COPD, diabetes, hypertension, chronic kidney disease stage IV, obstructive sleep apnea, hyperlipidemia, morbid obesity, depression, chronic diastolic CHF, anemia of chronic disease who presented to the hospital due to shortness of breath and noted to be in acute respiratory failure.  #1 acute respiratory failure with hypoxia and hypercapnia due to underlying pneumonia. -Continue IV ceftriaxone, sputum cultures were unremarkable. However   blood cultures grew Streptococcus pneumonia. Continue mechanical ventilation , patient's extubation will be terminal, per pulmonologist, but patient's family is not ready to extubate him today. No reintubation. Patient's sister is in the room as well as patient's oldest daughter. Apparently there is a plan for patient's brother to come from the [place] to visit him and possibly even to marry youngest daughter while the patient is alive.     #2 pneumonia-suspect streptococcal pneumonia, although sputum culture done on 11/13/2015 is normal. Patient's blood cultures are positive for Streptococcus. -Continue IV ceftriaxone. sputum cultures is negative as above.  #3 non-ST elevation MI-patient has ruled in by cardiac markers. He remains clinically asymptomatic though. Medical therapy was recommended by cardiologist due to renal failure The patient was on ASA,  heparin subcutaneously, however, developed gastrointestinal bleed and a bolus of these indications were laced on hold. Appreciate cardiology input, and continue medical management. Patient is not on beta blockers , nitroglycerin given his hypotension and sepsis.  #4 sepsis due to Streptococcus pneumonia secondary to pneumonia. -Continue IV ceftriaxone. blood cultures which are growing Streptococcus. Clinically stable  #5 acute on chronic renal failure due to ATN, now oliguric-he has baseline CK disease stage IV. He does not want dialysis, even temporarily. - Baseline Cr. 2.6 in 12/16 and yesterday 5.8.  Likely Cardiorenal in nature given his NSTEMI.  Urine output is declining, about 300 over the past 24 hours   -Nephrology has been consulted and continue care as per them.  #6 hypokalemia-resolved  #7 depression - continue Elavil, Effexor.   #8 diabetes type 2 without complication-continue sliding scale insulin.  #9 diabetic neuropathy-continue Neurontin, dose has been decreased. #10. Gastrointestinal bleed, of aspirin as well as heparin now on  Protonix intravenously twice a day, stabilized  All the records are reviewed and case discussed with Care Management/Social Workerr. Management plans discussed with the patient, family and they are in agreement.  CODE STATUS: Full   DVT Prophylaxis: None  TOTAL Critical Care TIME TAKING CARE OF THIS PATIENT: 60 minutes.   Discussed with multiple family members    Sadira Standard M.D on 11/19/2015 at 2:43 PM  Between 7am to 6pm - Pager - [phone]  After 6pm go to [email or web address] - password [email or web address] [facility]  [facility]  Office  [phone]  CC: Primary care physician; Ruel FavorsKrichna F Sowles, MD

## 2015-11-19 NOTE — Progress Notes (Signed)
eLink Physician-Brief Progress Note Patient Name: Carl Perry DOB: [birth year] MRN: [id number]   Date of Service  11/19/2015  HPI/Events of Note  Blood glucose = 184. Patient on enteral nutrition.   eICU Interventions  Will order Q 4 hour sensitive Novolog SSI.      Intervention Category Intermediate Interventions: Hyperglycemia - evaluation and treatment  [name],Carl Perry 11/19/2015, 5:41 PM

## 2015-11-19 NOTE — Progress Notes (Signed)
[facility]  ROUNDING NOTE   Subjective:   UOP remains poor Daughter and sister at bedside   Creatinine increased to 5.80 from  4.86 (4.11) (3.6) (3.35) (3.24) Intubated and placed on ventilator 1/9 Neuromuscular twitching noted   Objective:  Vital signs in last 24 hours:  Temp:  [98.2 F (36.8 C)-101.9 F (38.8 C)] 100.2 F (37.9 C) (01/11 0400) Pulse Rate:  [85-93] 87 (01/11 0900) Resp:  [11-24] 22 (01/11 0900) BP: (89-110)/(46-62) 95/54 mmHg (01/11 0900) SpO2:  [90 %-97 %] 90 % (01/11 0900) FiO2 (%):  [40 %-50 %] 40 % (01/11 0900) Weight:  [130.5 kg (287 lb 11.2 oz)-131.5 kg (289 lb 14.5 oz)] 131.5 kg (289 lb 14.5 oz) (01/11 0449)  Weight change: 2 kg (4 lb 6.5 oz) Filed Weights   11/17/15 1300 11/18/15 1525 11/19/15 0449  Weight: 128.5 kg (283 lb 4.7 oz) 130.5 kg (287 lb 11.2 oz) 131.5 kg (289 lb 14.5 oz)    Intake/Output: I/O last 3 completed shifts: In: 4393.5 [I.V.:2322; NG/GT:1921.5; IV Piggyback:150] Out: 309 [KPVVZ:482]   Intake/Output this shift:  Total I/O In: 266.3 [I.V.:116.3; NG/GT:100; IV Piggyback:50] Out: 13 [Urine:13]  Physical Exam: General: Critically ill,    Head: Normocephalic,    Eyes/ENT Anicteric, ETT  Neck: Supple, trachea midline  Lungs:  Vent assisted  Heart: Regular rate and rhythm, tachycardic  Abdomen:  Soft, nontender, obese  Extremities: 2+ peripheral edema.  Neurologic: Sedated at present, neuromuscular twitching noted  Skin: Venous stasis discoloration over lower legs  Dialysis Access: none    Basic Metabolic Panel:  Recent Labs Lab 12/08/2015 1801  11/14/15 0322 11/15/15 0335 11/16/15 0407 11/17/15 0405 11/18/15 0516  NA 138  < > 135 133* 134* 132* 132*  K 3.2*  < > 3.0* 3.8 4.4 5.1 4.0  CL 100*  < > 97* 95* 94* 92* 92*  CO2 22  < > '24 25 26 23 24  '$ GLUCOSE 190*  < > 86 92 87 149* 99  BUN 69*  < > 82* 88* 100* 114* 138*  CREATININE 2.95*  < > 3.35* 3.60* 4.11* 4.86* 5.80*  CALCIUM 9.1  < > 8.9 8.8*  9.1 8.6* 7.6*  MG 2.1  --   --   --   --  2.6*  --   PHOS  --   --   --   --   --  12.3*  --   < > = values in this interval not displayed.  Liver Function Tests:  Recent Labs Lab 11/10/2015 1801  AST 18  ALT 11*  ALKPHOS 73  BILITOT 0.6  PROT 8.9*  ALBUMIN 2.6*   No results for input(s): LIPASE, AMYLASE in the last 168 hours. No results for input(s): AMMONIA in the last 168 hours.  CBC:  Recent Labs Lab  1801  11/14/15 0322 11/15/15 0335 11/16/15 0407 11/17/15 0405 11/18/15 0516  WBC 26.1*  < > 17.2* 18.6* 17.5* 23.9* 15.5*  NEUTROABS 22.8*  --   --   --   --   --   --   HGB 10.6*  < > 8.1* 8.6* 8.4* 8.9* 7.8*  HCT 34.0*  < > 25.3* 27.5* 26.9* 28.6* 24.2*  MCV 77.4*  < > 75.0* 77.3* 77.5* 77.9* 75.5*  PLT 488*  < > 381 402 354 363 334  < > = values in this interval not displayed.  Cardiac Enzymes:  Recent Labs Lab 12/02/2015 1801 11/13/15 0033 11/13/15 0314 11/13/15 1008 11/14/15 1050  TROPONINI 1.01* 11.85* 18.90* 32.74* 15.27*    BNP: Invalid input(s): POCBNP  CBG:  Recent Labs Lab 11/18/15 1557 11/18/15 1908 11/18/15 2348 11/19/15 0416 11/19/15 0739  GLUCAP 128* 133* 142* 144* 132*    Microbiology: Results for orders placed or performed during the hospital encounter of 11/28/2015  Blood culture (routine x 2)     Status: None   Collection Time: 11/16/2015  6:01 PM  Result Value Ref Range Status   Specimen Description BLOOD LEFT HAND  Final   Special Requests BOTTLES DRAWN AEROBIC AND ANAEROBIC 4CC  Final   Culture  Setup Time   Final    GRAM POSITIVE COCCI IN PAIRS IN BOTH AEROBIC AND ANAEROBIC BOTTLES CRITICAL VALUE NOTED.  VALUE IS CONSISTENT WITH PREVIOUSLY REPORTED AND CALLED VALUE.    Culture   Final    STREPTOCOCCUS PNEUMONIAE IN BOTH AEROBIC AND ANAEROBIC BOTTLES    Report Status 11/15/2015 FINAL  Final   Organism ID, Bacteria STREPTOCOCCUS PNEUMONIAE  Final      Susceptibility   Streptococcus pneumoniae - MIC*     ERYTHROMYCIN <=0.12 SENSITIVE Sensitive     VANCOMYCIN 0.5 SENSITIVE Sensitive     TRIMETH/SULFA <=10 SENSITIVE Sensitive     CLINDAMYCIN <=0.25 SENSITIVE Sensitive     CEFTRIAXONE Value in next row Sensitive      SENSITIVE<=0.12    PENICILLIN Value in next row Sensitive      SENSITIVE<=0.06    * STREPTOCOCCUS PNEUMONIAE  Blood culture (routine x 2)     Status: None   Collection Time: 12/05/2015  6:07 PM  Result Value Ref Range Status   Specimen Description BLOOD LEFT ASSIST CONTROL  Final   Special Requests BOTTLES DRAWN AEROBIC AND ANAEROBIC [place]  Final   Culture  Setup Time   Final    GRAM POSITIVE COCCI IN PAIRS IN BOTH AEROBIC AND ANAEROBIC BOTTLES CRITICAL RESULT CALLED TO, READ BACK BY AND VERIFIED WITH: HANK ZOMPA AT 5625 ON 11/23/15 CTJ    Culture   Final    STREPTOCOCCUS PNEUMONIAE IN BOTH AEROBIC AND ANAEROBIC BOTTLES    Report Status 11/15/2015 FINAL  Final   Organism ID, Bacteria STREPTOCOCCUS PNEUMONIAE  Final      Susceptibility   Streptococcus pneumoniae - MIC*    ERYTHROMYCIN <=0.12 SENSITIVE Sensitive     VANCOMYCIN 0.5 SENSITIVE Sensitive     TRIMETH/SULFA <=10 SENSITIVE Sensitive     CLINDAMYCIN <=0.25 SENSITIVE Sensitive     CEFTRIAXONE Value in next row Sensitive      SENSITIVE<=0.12    PENICILLIN Value in next row Sensitive      SENSITIVE<=0.06    * STREPTOCOCCUS PNEUMONIAE  Blood Culture ID Panel (Reflexed)     Status: Abnormal   Collection Time: 11/30/2015  6:07 PM  Result Value Ref Range Status   Enterococcus species NOT DETECTED NOT DETECTED Final   Listeria monocytogenes NOT DETECTED NOT DETECTED Final   Staphylococcus species NOT DETECTED NOT DETECTED Final   Staphylococcus aureus NOT DETECTED NOT DETECTED Final   Streptococcus species DETECTED (A) NOT DETECTED Corrected    Comment: CRITICAL RESULT CALLED TO, READ BACK BY AND VERIFIED WITH: HANK ZOMPA AT 1220 ON 11/13/15 CTJ CORRECTED ON 01/06 AT 6389: PREVIOUSLY REPORTED AS DETECTED     Streptococcus agalactiae NOT DETECTED NOT DETECTED Final   Streptococcus pneumoniae DETECTED (A) NOT DETECTED Final    Comment: CRITICAL RESULT CALLED TO, READ BACK BY AND VERIFIED WITH: HANK ZOMPA AT 1220 ON 11/13/15 CTJ  Streptococcus pyogenes NOT DETECTED NOT DETECTED Final   Acinetobacter baumannii NOT DETECTED NOT DETECTED Final   Enterobacteriaceae species NOT DETECTED NOT DETECTED Final   Enterobacter cloacae complex NOT DETECTED NOT DETECTED Final   Escherichia coli NOT DETECTED NOT DETECTED Final   Klebsiella oxytoca NOT DETECTED NOT DETECTED Final   Klebsiella pneumoniae NOT DETECTED NOT DETECTED Final   Proteus species NOT DETECTED NOT DETECTED Final   Serratia marcescens NOT DETECTED NOT DETECTED Final   Haemophilus influenzae NOT DETECTED NOT DETECTED Final   Neisseria meningitidis NOT DETECTED NOT DETECTED Final   Pseudomonas aeruginosa NOT DETECTED NOT DETECTED Final   Candida albicans NOT DETECTED NOT DETECTED Final   Candida glabrata NOT DETECTED NOT DETECTED Final   Candida krusei NOT DETECTED NOT DETECTED Final   Candida parapsilosis NOT DETECTED NOT DETECTED Final   Candida tropicalis NOT DETECTED NOT DETECTED Final   Carbapenem resistance NOT DETECTED NOT DETECTED Final   Methicillin resistance NOT DETECTED NOT DETECTED Final   Vancomycin resistance NOT DETECTED NOT DETECTED Final  MRSA PCR Screening     Status: None   Collection Time:  10:01 PM  Result Value Ref Range Status   MRSA by PCR NEGATIVE NEGATIVE Final    Comment:        The GeneXpert MRSA Assay (FDA approved for NASAL specimens only), is one component of a comprehensive MRSA colonization surveillance program. It is not intended to diagnose MRSA infection nor to guide or monitor treatment for MRSA infections.   Culture, expectorated sputum-assessment     Status: None   Collection Time: 11/13/15  2:56 AM  Result Value Ref Range Status   Specimen Description SPUTUM  Final   Special  Requests Normal  Final   Sputum evaluation   Final    Sputum specimen not acceptable for testing.  Please recollect.   CALLED BETH [place] AT 0254 11/13/15.PMH   Report Status 11/13/2015 FINAL  Final  Culture, expectorated sputum-assessment     Status: None   Collection Time: 11/17/15  9:57 AM  Result Value Ref Range Status   Specimen Description SPUTUM  Final   Special Requests Normal  Final   Sputum evaluation THIS SPECIMEN IS ACCEPTABLE FOR SPUTUM CULTURE  Final   Report Status 11/18/2015 FINAL  Final  Culture, respiratory (NON-Expectorated)     Status: None (Preliminary result)   Collection Time: 11/17/15  9:57 AM  Result Value Ref Range Status   Specimen Description SPUTUM  Final   Special Requests Normal Reflexed from [id number]  Final   Gram Stain PENDING  Incomplete   Culture HOLDING FOR POSSIBLE PATHOGEN  Final   Report Status PENDING  Incomplete    Coagulation Studies: No results for input(s): LABPROT, INR in the last 72 hours.  Urinalysis: No results for input(s): COLORURINE, LABSPEC, PHURINE, GLUCOSEU, HGBUR, BILIRUBINUR, KETONESUR, PROTEINUR, UROBILINOGEN, NITRITE, LEUKOCYTESUR in the last 72 hours.  Invalid input(s): APPERANCEUR    Imaging: Dg Chest Port 1 View  11/17/2015  CLINICAL DATA:  Central line placement and intubation and gastric tube placement EXAM: PORTABLE CHEST 1 VIEW COMPARISON:  11/17/2015 FINDINGS: Endotracheal tube in good position Left jugular central venous catheter tip in the SVC. No pneumothorax. Gastric tube is seen in the esophagus with the tip not visualized. Progression of airspace disease on the right with associated right pleural effusion. Bibasilar atelectasis. IMPRESSION: Endotracheal tube in good position. Central venous catheter tip in the SVC without pneumothorax Progression of right pleural effusion and right-sided airspace disease. This  may represent asymmetric edema versus pneumonia. Electronically Signed   By: Franchot Gallo M.D.   On:  11/17/2015 11:48   Dg Abd Portable 1v  11/17/2015  CLINICAL DATA:  OG tube placement EXAM: PORTABLE ABDOMEN - 1 VIEW COMPARISON:  None. FINDINGS: 1127 hours. NG tube tip is positioned in the mid stomach. Visualized abdomen has an unremarkable bowel gas pattern. IMPRESSION: NG tube tip in the mid stomach. Electronically Signed   By: Misty Stanley M.D.   On: 11/17/2015 11:48     Medications:   . feeding supplement (VITAL AF 1.2 CAL) 1,000 mL (11/18/15 1900)  . fentaNYL infusion INTRAVENOUS Stopped (11/19/15 0900)  .  sodium bicarbonate 150 mEq in sterile water 1000 mL infusion 50 mL/hr at 11/19/15 0022   . amitriptyline  50 mg Oral QHS  . antiseptic oral rinse  7 mL Mouth Rinse q12n4p  . aspirin  81 mg Oral Daily  .  ceFAZolin (ANCEF) IV  2 g Intravenous Q12H  . chlorhexidine  15 mL Mouth Rinse BID  . free water  30 mL Per Tube 6 times per day  . gabapentin  600 mg Oral QHS  . gemfibrozil  600 mg Oral BID  . ipratropium-albuterol  3 mL Nebulization Q4H  . lactulose  10 g Oral BID  . pantoprazole (PROTONIX) IV  40 mg Intravenous Q12H  . senna-docusate  1 tablet Oral Daily  . sodium chloride  10-40 mL Intracatheter Q12H  . sodium chloride  3 mL Intravenous Q12H  . sodium chloride  3 mL Intravenous Q12H   sodium chloride, acetaminophen, albuterol, artificial tears, baclofen, sodium chloride, sodium chloride, sodium chloride  Assessment/ Plan:  Mr. Carl Perry is a 60 y.o. 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 Moderate concentric hypertrophy. Systolic function was mildly reduced. The estimated ejection fraction was in the range of 45% to 50%. There appears to be severe hypokinesis of the apical, lateral and anterolateral myocardium  1. Acute Renal Failure on  Chronic kidney disease stage IV with nephrotic range proteinuria: baseline creatinine of 2.69, eGFR of 25, 10/21/2015 Acute renal failure from acute illness: acute MI, pneumonia, and hypotension.    Chronic kidney disease secondary with nephrotic syndrome to long standing diabetes mellitus insulin dependent type II, hypertension and nonsteroidal use.  -  family consensus at this point is that patient had poor quality of life and would not want to prolong with dialysis - will respect family wishes. No dialysis.  - decreased dose of gabapentin due to renal failure    2. Diabetes Mellitus type II insulin dependent with chronic kidney disease:  With nephrotic range proteinuria.  - Continue glucose control.   3. Acute resp failure / Pneumonia / Strep Pneumonia Sepsis - intubated 11/17/15, vent assisted at present - mgmt as per ICU team      LOS: 7 Kyser Wandel 1/11/201710:08 AM

## 2015-11-19 NOTE — Progress Notes (Signed)
PHARMACY - CRITICAL CARE PROGRESS NOTE  Pharmacy Consult for cefazolin/constipation prevention Indication: bacteremia     Allergies  Allergen Reactions  . Statins Swelling and Other (See Comments)    Reaction:  Rhabdomyolysis  . Ivp Dye [Iodinated Diagnostic Agents] Other (See Comments)    Reaction:  Unknown   . Penicillins Rash and Other (See Comments)    Unable to obtain enough information to answer additional questions about this medication.      Patient Measurements: Height: 5\' 9"  (175.3 cm) Weight: 289 lb 14.5 oz (131.5 kg) IBW/kg (Calculated) : 70.7   Vital Signs: Temp: 100.5 F (38.1 C) (01/11 2000) Temp Source: Oral (01/11 2000) BP: 141/59 mmHg (01/11 1900) Pulse Rate: 89 (01/11 1900) Intake/Output from previous day: 01/10 0701 - 01/11 0700 In: 3027 [I.V.:1477; NG/GT:1450; IV Piggyback:100] Out: 309 [Urine:309] Intake/Output from this shift: Total I/O In: 233.8 [I.V.:103.8; NG/GT:130] Out: 125 [Urine:125] Vent settings for last 24 hours: Vent Mode:  [-] PRVC FiO2 (%):  [40 %] 40 % Set Rate:  [20 bmp] 20 bmp Vt Set:  [570 mL] 570 mL PEEP:  [5 cmH20] 5 cmH20 Plateau Pressure:  [22 cmH20] 22 cmH20  Labs:  Recent Labs  11/17/15 0405 11/18/15 0516  WBC 23.9* 15.5*  HGB 8.9* 7.8*  HCT 28.6* 24.2*  PLT 363 334  CREATININE 4.86* 5.80*  MG 2.6*  --   PHOS 12.3*  --    Estimated Creatinine Clearance: 18.4 mL/min (by C-G formula based on Cr of 5.8).   Recent Labs  11/19/15 1624 11/19/15 1809 11/19/15 1957  GLUCAP 184* 179* 191*    Microbiology: Recent Results (from the past 720 hour(s))  Blood culture (routine x 2)     Status: None   Collection Time: 11/11/2015  6:01 PM  Result Value Ref Range Status   Specimen Description BLOOD LEFT HAND  Final   Special Requests BOTTLES DRAWN AEROBIC AND ANAEROBIC 4CC  Final   Culture  Setup Time   Final    GRAM POSITIVE COCCI IN PAIRS IN BOTH AEROBIC AND ANAEROBIC BOTTLES CRITICAL VALUE NOTED.  VALUE IS  CONSISTENT WITH PREVIOUSLY REPORTED AND CALLED VALUE.    Culture   Final    STREPTOCOCCUS PNEUMONIAE IN BOTH AEROBIC AND ANAEROBIC BOTTLES    Report Status 11/15/2015 FINAL  Final   Organism ID, Bacteria STREPTOCOCCUS PNEUMONIAE  Final      Susceptibility   Streptococcus pneumoniae - MIC*    ERYTHROMYCIN <=0.12 SENSITIVE Sensitive     VANCOMYCIN 0.5 SENSITIVE Sensitive     TRIMETH/SULFA <=10 SENSITIVE Sensitive     CLINDAMYCIN <=0.25 SENSITIVE Sensitive     CEFTRIAXONE Value in next row Sensitive      SENSITIVE<=0.12    PENICILLIN Value in next row Sensitive      SENSITIVE<=0.06    * STREPTOCOCCUS PNEUMONIAE  Blood culture (routine x 2)     Status: None   Collection Time: 11/13/2015  6:07 PM  Result Value Ref Range Status   Specimen Description BLOOD LEFT ASSIST CONTROL  Final   Special Requests BOTTLES DRAWN AEROBIC AND ANAEROBIC 7CC  Final   Culture  Setup Time   Final    GRAM POSITIVE COCCI IN PAIRS IN BOTH AEROBIC AND ANAEROBIC BOTTLES CRITICAL RESULT CALLED TO, READ BACK BY AND VERIFIED WITH: HANK ZOMPA AT 1220 ON 11/23/15 CTJ    Culture   Final    STREPTOCOCCUS PNEUMONIAE IN BOTH AEROBIC AND ANAEROBIC BOTTLES    Report Status 11/15/2015 FINAL  Final  Organism ID, Bacteria STREPTOCOCCUS PNEUMONIAE  Final      Susceptibility   Streptococcus pneumoniae - MIC*    ERYTHROMYCIN <=0.12 SENSITIVE Sensitive     VANCOMYCIN 0.5 SENSITIVE Sensitive     TRIMETH/SULFA <=10 SENSITIVE Sensitive     CLINDAMYCIN <=0.25 SENSITIVE Sensitive     CEFTRIAXONE Value in next row Sensitive      SENSITIVE<=0.12    PENICILLIN Value in next row Sensitive      SENSITIVE<=0.06    * STREPTOCOCCUS PNEUMONIAE  Blood Culture ID Panel (Reflexed)     Status: Abnormal   Collection Time: 11/11/2015  6:07 PM  Result Value Ref Range Status   Enterococcus species NOT DETECTED NOT DETECTED Final   Listeria monocytogenes NOT DETECTED NOT DETECTED Final   Staphylococcus species NOT DETECTED NOT DETECTED Final    Staphylococcus aureus NOT DETECTED NOT DETECTED Final   Streptococcus species DETECTED (A) NOT DETECTED Corrected    Comment: CRITICAL RESULT CALLED TO, READ BACK BY AND VERIFIED WITH: HANK ZOMPA AT 1220 ON 11/13/15 CTJ CORRECTED ON 01/06 AT 1191: PREVIOUSLY REPORTED AS DETECTED    Streptococcus agalactiae NOT DETECTED NOT DETECTED Final   Streptococcus pneumoniae DETECTED (A) NOT DETECTED Final    Comment: CRITICAL RESULT CALLED TO, READ BACK BY AND VERIFIED WITH: HANK ZOMPA AT 1220 ON 11/13/15 CTJ    Streptococcus pyogenes NOT DETECTED NOT DETECTED Final   Acinetobacter baumannii NOT DETECTED NOT DETECTED Final   Enterobacteriaceae species NOT DETECTED NOT DETECTED Final   Enterobacter cloacae complex NOT DETECTED NOT DETECTED Final   Escherichia coli NOT DETECTED NOT DETECTED Final   Klebsiella oxytoca NOT DETECTED NOT DETECTED Final   Klebsiella pneumoniae NOT DETECTED NOT DETECTED Final   Proteus species NOT DETECTED NOT DETECTED Final   Serratia marcescens NOT DETECTED NOT DETECTED Final   Haemophilus influenzae NOT DETECTED NOT DETECTED Final   Neisseria meningitidis NOT DETECTED NOT DETECTED Final   Pseudomonas aeruginosa NOT DETECTED NOT DETECTED Final   Candida albicans NOT DETECTED NOT DETECTED Final   Candida glabrata NOT DETECTED NOT DETECTED Final   Candida krusei NOT DETECTED NOT DETECTED Final   Candida parapsilosis NOT DETECTED NOT DETECTED Final   Candida tropicalis NOT DETECTED NOT DETECTED Final   Carbapenem resistance NOT DETECTED NOT DETECTED Final   Methicillin resistance NOT DETECTED NOT DETECTED Final   Vancomycin resistance NOT DETECTED NOT DETECTED Final  MRSA PCR Screening     Status: None   Collection Time: 11/26/2015 10:01 PM  Result Value Ref Range Status   MRSA by PCR NEGATIVE NEGATIVE Final    Comment:        The GeneXpert MRSA Assay (FDA approved for NASAL specimens only), is one component of a comprehensive MRSA colonization surveillance  program. It is not intended to diagnose MRSA infection nor to guide or monitor treatment for MRSA infections.   Culture, expectorated sputum-assessment     Status: None   Collection Time: 11/13/15  2:56 AM  Result Value Ref Range Status   Specimen Description SPUTUM  Final   Special Requests Normal  Final   Sputum evaluation   Final    Sputum specimen not acceptable for testing.  Please recollect.   CALLED BETH BUONO AT [phone] 11/13/15.PMH   Report Status 11/13/2015 FINAL  Final  Culture, expectorated sputum-assessment     Status: None   Collection Time: 11/17/15  9:57 AM  Result Value Ref Range Status   Specimen Description SPUTUM  Final   Special Requests  Normal  Final   Sputum evaluation THIS SPECIMEN IS ACCEPTABLE FOR SPUTUM CULTURE  Final   Report Status 11/18/2015 FINAL  Final  Culture, respiratory (NON-Expectorated)     Status: None (Preliminary result)   Collection Time: 11/17/15  9:57 AM  Result Value Ref Range Status   Specimen Description SPUTUM  Final   Special Requests Normal Reflexed from [id number]  Final   Gram Stain   Final    FAIR SPECIMEN - 70-80% WBCS FEW WBC SEEN FEW YEAST    Culture MODERATE GROWTH YEAST IDENTIFICATION TO FOLLOW   Final   Report Status PENDING  Incomplete    Medications:  Scheduled:  . antiseptic oral rinse  7 mL Mouth Rinse q12n4p  . aspirin  81 mg Oral Daily  .  ceFAZolin (ANCEF) IV  2 g Intravenous Q12H  . chlorhexidine  15 mL Mouth Rinse BID  . free water  30 mL Per Tube 6 times per day  . gemfibrozil  600 mg Oral BID  . insulin aspart  0-9 Units Subcutaneous 6 times per day  . ipratropium-albuterol  3 mL Nebulization Q4H  . lactulose  10 g Oral BID  . pantoprazole (PROTONIX) IV  40 mg Intravenous Q12H  . senna-docusate  1 tablet Oral Daily  . sodium chloride  10-40 mL Intracatheter Q12H  . sodium chloride  3 mL Intravenous Q12H  . sodium chloride  3 mL Intravenous Q12H   Infusions:  . feeding supplement (VITAL AF 1.2 CAL)  1,000 mL (11/19/15 1430)  . fentaNYL infusion INTRAVENOUS 25 mcg/hr (11/19/15 1930)  .  sodium bicarbonate 150 mEq in sterile water 1000 mL infusion 50 mL/hr at 11/19/15 2053    Assessment: Pharmacy consulted for medication management for 60 yo male ICU patient. Patient is currently on day 2 (day 6 of antibiotics) of cefazolin 2g IV Q12hr for pan-sensitive strep pneumonia.   Plan:  1. Cefazolin: Will continue patient on cefazolin 2g IV Q12hr.  2. Constipation: Patient with bowel movement on 1/10. Will decrease senna/docuste to 1 tab po daily.   Pharmacy will continue to monitor and adjust per consult.    Amoura Ransier,Obinna L 11/19/2015,9:20 PM

## 2015-11-19 NOTE — Progress Notes (Signed)
   11/19/15 1300  Clinical Encounter Type  Visited With Patient;Family;Health care provider  Visit Type Initial;Follow-up;Spiritual support  Referral From Nurse  Consult/Referral To Chaplain  Spiritual Encounters  Spiritual Needs Prayer;Emotional  Stress Factors  Patient Stress Factors Health changes  Family Stress Factors Health changes;Loss;Major life changes  Chaplain rounded in the hospital and noticed family member crying in hallway. Offered prayer and support. Later was paged to the unit as the family requested that the daughter of the patient desired to get married before her father was extubated. She went to get birth certificate and info needed. Wife of patient and mother of patient's daughter asked if we could perform marriage. Chaplain informed her that she needed to speak with daughter and asked particular questions regarding request. Informed family that we would need to check with Pastoral Care and hospital about specifics. Chaplain Lang Zingg A. Kemarion Abbey Ext. [phone]

## 2015-11-19 NOTE — Progress Notes (Signed)
Palliative Care Update  I talked with nursing and one sister and reviewed chart.  Aware of plan already set in place.  Will revisit tomorrow for supportive discussions and to help where I can.  Full note  to follow.    Suan HalterMargaret F Liliauna Santoni, MD

## 2015-11-20 ENCOUNTER — Other Ambulatory Visit: Payer: Self-pay | Admitting: Family Medicine

## 2015-11-20 LAB — GLUCOSE, CAPILLARY
GLUCOSE-CAPILLARY: 176 mg/dL — AB (ref 65–99)
Glucose-Capillary: 169 mg/dL — ABNORMAL HIGH (ref 65–99)
Glucose-Capillary: 160 mg/dL — ABNORMAL HIGH (ref 65–99)
Glucose-Capillary: 192 mg/dL — ABNORMAL HIGH (ref 65–99)
Glucose-Capillary: 152 mg/dL — ABNORMAL HIGH (ref 65–99)
Glucose-Capillary: 138 mg/dL — ABNORMAL HIGH (ref 65–99)
Glucose-Capillary: 114 mg/dL — ABNORMAL HIGH (ref 65–99)

## 2015-11-20 LAB — BASIC METABOLIC PANEL
Anion gap: 17 — ABNORMAL HIGH (ref 5–15)
BUN: 158 mg/dL — AB (ref 6–20)
CALCIUM: 7.8 mg/dL — AB (ref 8.9–10.3)
CO2: 28 mmol/L (ref 22–32)
CREATININE: 6.46 mg/dL — AB (ref 0.61–1.24)
Chloride: 91 mmol/L — ABNORMAL LOW (ref 101–111)
GFR calc Af Amer: 10 mL/min — ABNORMAL LOW (ref >60)
GFR, EST NON AFRICAN AMERICAN: 8 mL/min — AB (ref >60)
GLUCOSE: 184 mg/dL — AB (ref 65–99)
Potassium: 4.3 mmol/L (ref 3.5–5.1)
SODIUM: 136 mmol/L (ref 135–145)

## 2015-11-20 MED ORDER — LORAZEPAM 2 MG/ML IJ SOLN
2.0000 mg | INTRAMUSCULAR | Status: DC | PRN
  Administered 2015-11-20: 2 mg via INTRAVENOUS
  Filled 2015-11-20: qty 1

## 2015-11-20 NOTE — Progress Notes (Signed)
Pt vomited tube feed. Tube feeds were stopped, low intermittent suction applied per Dr. Belia HemanKasa. 400 mL out.

## 2015-11-20 NOTE — Progress Notes (Signed)
PHARMACY - CRITICAL CARE PROGRESS NOTE  Pharmacy Consult for cefazolin/constipation prevention Indication: bacteremia     Allergies  Allergen Reactions  . Statins Swelling and Other (See Comments)    Reaction:  Rhabdomyolysis  . Ivp Dye [Iodinated Diagnostic Agents] Other (See Comments)    Reaction:  Unknown   . Penicillins Rash and Other (See Comments)    Unable to obtain enough information to answer additional questions about this medication.      Patient Measurements: Height: 5\' 9"  (175.3 cm) Weight: 291 lb 14.2 oz (132.4 kg) IBW/kg (Calculated) : 70.7   Vital Signs: Temp: 99.6 F (37.6 C) (01/12 2000) Temp Source: Axillary (01/12 2000) BP: 99/63 mmHg (01/12 2200) Pulse Rate: 86 (01/12 2200) Intake/Output from previous day: 01/11 0701 - 01/12 0700 In: 2635.2 [I.V.:1195.2; NG/GT:1340; IV Piggyback:100] Out: 653 [Urine:653] Intake/Output from this shift: Total I/O In: 30 [NG/GT:30] Out: 400 [Urine:400] Vent settings for last 24 hours: Vent Mode:  [-] PRVC FiO2 (%):  [40 %] 40 % Set Rate:  [20 bmp] 20 bmp Vt Set:  [570 mL] 570 mL PEEP:  [5 cmH20] 5 cmH20 Plateau Pressure:  [22 cmH20-26 cmH20] 23 cmH20  Labs:  Recent Labs  11/18/15 0516 11/20/15 0433  WBC 15.5*  --   HGB 7.8*  --   HCT 24.2*  --   PLT 334  --   CREATININE 5.80* 6.46*   Estimated Creatinine Clearance: 16.6 mL/min (by C-G formula based on Cr of 6.46).   Recent Labs  11/20/15 1417 11/20/15 1607 11/20/15 1957  GLUCAP 152* 138* 114*    Microbiology: Recent Results (from the past 720 hour(s))  Blood culture (routine x 2)     Status: None   Collection Time: 12/06/2015  6:01 PM  Result Value Ref Range Status   Specimen Description BLOOD LEFT HAND  Final   Special Requests BOTTLES DRAWN AEROBIC AND ANAEROBIC 4CC  Final   Culture  Setup Time   Final    GRAM POSITIVE COCCI IN PAIRS IN BOTH AEROBIC AND ANAEROBIC BOTTLES CRITICAL VALUE NOTED.  VALUE IS CONSISTENT WITH PREVIOUSLY REPORTED AND  CALLED VALUE.    Culture   Final    STREPTOCOCCUS PNEUMONIAE IN BOTH AEROBIC AND ANAEROBIC BOTTLES    Report Status 11/15/2015 FINAL  Final   Organism ID, Bacteria STREPTOCOCCUS PNEUMONIAE  Final      Susceptibility   Streptococcus pneumoniae - MIC*    ERYTHROMYCIN <=0.12 SENSITIVE Sensitive     VANCOMYCIN 0.5 SENSITIVE Sensitive     TRIMETH/SULFA <=10 SENSITIVE Sensitive     CLINDAMYCIN <=0.25 SENSITIVE Sensitive     CEFTRIAXONE Value in next row Sensitive      SENSITIVE<=0.12    PENICILLIN Value in next row Sensitive      SENSITIVE<=0.06    * STREPTOCOCCUS PNEUMONIAE  Blood culture (routine x 2)     Status: None   Collection Time: 12/02/2015  6:07 PM  Result Value Ref Range Status   Specimen Description BLOOD LEFT ASSIST CONTROL  Final   Special Requests BOTTLES DRAWN AEROBIC AND ANAEROBIC 7CC  Final   Culture  Setup Time   Final    GRAM POSITIVE COCCI IN PAIRS IN BOTH AEROBIC AND ANAEROBIC BOTTLES CRITICAL RESULT CALLED TO, READ BACK BY AND VERIFIED WITH: HANK ZOMPA AT 1220 ON 11/23/15 CTJ    Culture   Final    STREPTOCOCCUS PNEUMONIAE IN BOTH AEROBIC AND ANAEROBIC BOTTLES    Report Status 11/15/2015 FINAL  Final   Organism ID,  Bacteria STREPTOCOCCUS PNEUMONIAE  Final      Susceptibility   Streptococcus pneumoniae - MIC*    ERYTHROMYCIN <=0.12 SENSITIVE Sensitive     VANCOMYCIN 0.5 SENSITIVE Sensitive     TRIMETH/SULFA <=10 SENSITIVE Sensitive     CLINDAMYCIN <=0.25 SENSITIVE Sensitive     CEFTRIAXONE Value in next row Sensitive      SENSITIVE<=0.12    PENICILLIN Value in next row Sensitive      SENSITIVE<=0.06    * STREPTOCOCCUS PNEUMONIAE  Blood Culture ID Panel (Reflexed)     Status: Abnormal   Collection Time: 11/11/2015  6:07 PM  Result Value Ref Range Status   Enterococcus species NOT DETECTED NOT DETECTED Final   Listeria monocytogenes NOT DETECTED NOT DETECTED Final   Staphylococcus species NOT DETECTED NOT DETECTED Final   Staphylococcus aureus NOT DETECTED  NOT DETECTED Final   Streptococcus species DETECTED (A) NOT DETECTED Corrected    Comment: CRITICAL RESULT CALLED TO, READ BACK BY AND VERIFIED WITH: HANK ZOMPA AT 1220 ON 11/13/15 CTJ CORRECTED ON 01/06 AT 1610: PREVIOUSLY REPORTED AS DETECTED    Streptococcus agalactiae NOT DETECTED NOT DETECTED Final   Streptococcus pneumoniae DETECTED (A) NOT DETECTED Final    Comment: CRITICAL RESULT CALLED TO, READ BACK BY AND VERIFIED WITH: HANK ZOMPA AT 1220 ON 11/13/15 CTJ    Streptococcus pyogenes NOT DETECTED NOT DETECTED Final   Acinetobacter baumannii NOT DETECTED NOT DETECTED Final   Enterobacteriaceae species NOT DETECTED NOT DETECTED Final   Enterobacter cloacae complex NOT DETECTED NOT DETECTED Final   Escherichia coli NOT DETECTED NOT DETECTED Final   Klebsiella oxytoca NOT DETECTED NOT DETECTED Final   Klebsiella pneumoniae NOT DETECTED NOT DETECTED Final   Proteus species NOT DETECTED NOT DETECTED Final   Serratia marcescens NOT DETECTED NOT DETECTED Final   Haemophilus influenzae NOT DETECTED NOT DETECTED Final   Neisseria meningitidis NOT DETECTED NOT DETECTED Final   Pseudomonas aeruginosa NOT DETECTED NOT DETECTED Final   Candida albicans NOT DETECTED NOT DETECTED Final   Candida glabrata NOT DETECTED NOT DETECTED Final   Candida krusei NOT DETECTED NOT DETECTED Final   Candida parapsilosis NOT DETECTED NOT DETECTED Final   Candida tropicalis NOT DETECTED NOT DETECTED Final   Carbapenem resistance NOT DETECTED NOT DETECTED Final   Methicillin resistance NOT DETECTED NOT DETECTED Final   Vancomycin resistance NOT DETECTED NOT DETECTED Final  MRSA PCR Screening     Status: None   Collection Time: 11/15/2015 10:01 PM  Result Value Ref Range Status   MRSA by PCR NEGATIVE NEGATIVE Final    Comment:        The GeneXpert MRSA Assay (FDA approved for NASAL specimens only), is one component of a comprehensive MRSA colonization surveillance program. It is not intended to diagnose  MRSA infection nor to guide or monitor treatment for MRSA infections.   Culture, expectorated sputum-assessment     Status: None   Collection Time: 11/13/15  2:56 AM  Result Value Ref Range Status   Specimen Description SPUTUM  Final   Special Requests Normal  Final   Sputum evaluation   Final    Sputum specimen not acceptable for testing.  Please recollect.   CALLED BETH BUONO AT [phone] 11/13/15.PMH   Report Status 11/13/2015 FINAL  Final  Culture, expectorated sputum-assessment     Status: None   Collection Time: 11/17/15  9:57 AM  Result Value Ref Range Status   Specimen Description SPUTUM  Final   Special Requests Normal  Final   Sputum evaluation THIS SPECIMEN IS ACCEPTABLE FOR SPUTUM CULTURE  Final   Report Status 11/18/2015 FINAL  Final  Culture, respiratory (NON-Expectorated)     Status: None (Preliminary result)   Collection Time: 11/17/15  9:57 AM  Result Value Ref Range Status   Specimen Description SPUTUM  Final   Special Requests Normal Reflexed from [id number]  Final   Gram Stain   Final    FAIR SPECIMEN - 70-80% WBCS FEW WBC SEEN FEW YEAST    Culture MODERATE GROWTH YEAST IDENTIFICATION TO FOLLOW   Final   Report Status PENDING  Incomplete  CULTURE, BLOOD (ROUTINE X 2) w Reflex to PCR ID Panel     Status: None (Preliminary result)   Collection Time: 11/18/15 10:34 PM  Result Value Ref Range Status   Specimen Description BLOOD LEFT HAND  Final   Special Requests   Final    BOTTLES DRAWN AEROBIC AND ANAEROBIC ,   Culture NO GROWTH 2 DAYS  Final   Report Status PENDING  Incomplete  CULTURE, BLOOD (ROUTINE X 2) w Reflex to PCR ID Panel     Status: None (Preliminary result)   Collection Time: 11/18/15 10:34 PM  Result Value Ref Range Status   Specimen Description BLOOD LEFT ANTECUBITAL  Final   Special Requests BOTTLES DRAWN AEROBIC AND ANAEROBIC  Final   Culture NO GROWTH 2 DAYS  Final   Report Status PENDING  Incomplete     Medications:  Scheduled:  . antiseptic oral rinse  7 mL Mouth Rinse q12n4p  . aspirin  81 mg Oral Daily  .  ceFAZolin (ANCEF) IV  2 g Intravenous Q12H  . chlorhexidine  15 mL Mouth Rinse BID  . free water  30 mL Per Tube 6 times per day  . insulin aspart  0-9 Units Subcutaneous 6 times per day  . ipratropium-albuterol  3 mL Nebulization Q4H  . pantoprazole (PROTONIX) IV  40 mg Intravenous Q12H  . sodium chloride  10-40 mL Intracatheter Q12H  . sodium chloride  3 mL Intravenous Q12H  . sodium chloride  3 mL Intravenous Q12H   Infusions:  . feeding supplement (VITAL AF 1.2 CAL) Stopped (11/20/15 1130)  . fentaNYL infusion INTRAVENOUS 100 mcg/hr (11/20/15 1827)  .  sodium bicarbonate 150 mEq in sterile water 1000 mL infusion 50 mL/hr at 11/20/15 1827    Assessment: Pharmacy consulted for medication management for 60 yo male ICU patient. Patient is currently on day 2 (day 6 of antibiotics) of cefazolin 2g IV Q12hr for pan-sensitive strep pneumonia.   Plan:  1. Cefazolin: Will continue patient on cefazolin 2g IV Q12hr.  2. Constipation: Patient with ng tube to suction. Will hold constipation medication.  Pharmacy will continue to monitor and adjust per consult.    Valeri Sula,Khrystian L 11/20/2015,10:36 PM

## 2015-11-20 NOTE — Progress Notes (Signed)
   11/20/15 1600  Clinical Encounter Type  Visited With Patient;Family;Patient and family together;Health care provider  Visit Type Follow-up;Spiritual support  Referral From Family  Consult/Referral To Chaplain  Spiritual Encounters  Spiritual Needs Grief support  Stress Factors  Patient Stress Factors Not reviewed  Family Stress Factors Loss  Chaplain rounded in unit and offered support to family and staff. Will inform next chaplain about the plan for tomorrow's extubation and support needed for family per family (sister). Chaplain Marria Mathison A. Vale Mousseau Ext. [phone]

## 2015-11-20 NOTE — Progress Notes (Signed)
[facility]  ROUNDING NOTE   Subjective:   UOP remains poor Daughter and sister at bedside   Creatinine increased to 5.80 from  4.86 (4.11) (3.6) (3.35) (3.24) Intubated and placed on ventilator 1/9 Neuromuscular twitching noted   Objective:  Vital signs in last 24 hours:  Temp:  [98.2 F (36.8 C)-100.6 F (38.1 C)] 98.2 F (36.8 C) (01/12 0730) Pulse Rate:  [85-99] 86 (01/12 0800) Resp:  [18-26] 20 (01/12 0800) BP: (100-141)/(51-107) 120/107 mmHg (01/12 0800) SpO2:  [92 %-97 %] 94 % (01/12 0800) FiO2 (%):  [40 %] 40 % (01/12 0749) Weight:  [132.4 kg (291 lb 14.2 oz)] 132.4 kg (291 lb 14.2 oz) (01/12 0451)  Weight change: 1.9 kg (4 lb 3 oz) Filed Weights   11/18/15 1525 11/19/15 0449 11/20/15 0451  Weight: 130.5 kg (287 lb 11.2 oz) 131.5 kg (289 lb 14.5 oz) 132.4 kg (291 lb 14.2 oz)    Intake/Output: I/O last 3 completed shifts: In: 4071.2 [I.V.:1921.2; NG/GT:2000; IV Piggyback:150] Out: 354 [Urine:782]   Intake/Output this shift:  Total I/O In: 210 [I.V.:110; NG/GT:100] Out: -   Physical Exam: General: Critically ill,    Head: Normocephalic,    Eyes/ENT Anicteric, ETT  Neck: Supple, trachea midline  Lungs:  Vent assisted  Heart: Regular rate and rhythm, tachycardic  Abdomen:  Soft, nontender, obese  Extremities: 2+ peripheral edema.  Neurologic: Sedated at present, neuromuscular twitching noted  Skin: Venous stasis discoloration over lower legs  Dialysis Access: none    Basic Metabolic Panel:  Recent Labs Lab 11/15/15 0335 11/16/15 0407 11/17/15 0405 11/18/15 0516 11/20/15 0433  NA 133* 134* 132* 132* 136  K 3.8 4.4 5.1 4.0 4.3  CL 95* 94* 92* 92* 91*  CO2 '25 26 23 24 28  '$ GLUCOSE 92 87 149* 99 184*  BUN 88* 100* 114* 138* 158*  CREATININE 3.60* 4.11* 4.86* 5.80* 6.46*  CALCIUM 8.8* 9.1 8.6* 7.6* 7.8*  MG  --   --  2.6*  --   --   PHOS  --   --  12.3*  --   --     Liver Function Tests: No results for input(s): AST, ALT,  ALKPHOS, BILITOT, PROT, ALBUMIN in the last 168 hours. No results for input(s): LIPASE, AMYLASE in the last 168 hours. No results for input(s): AMMONIA in the last 168 hours.  CBC:  Recent Labs Lab 11/14/15 0322 11/15/15 0335 11/16/15 0407 11/17/15 0405 11/18/15 0516  WBC 17.2* 18.6* 17.5* 23.9* 15.5*  HGB 8.1* 8.6* 8.4* 8.9* 7.8*  HCT 25.3* 27.5* 26.9* 28.6* 24.2*  MCV 75.0* 77.3* 77.5* 77.9* 75.5*  PLT 381 402 354 363 334    Cardiac Enzymes:  Recent Labs Lab 11/13/15 1008 11/14/15 1050  TROPONINI 32.74* 15.27*    BNP: Invalid input(s): POCBNP  CBG:  Recent Labs Lab 11/19/15 1809 11/19/15 1957 11/20/15 0017 11/20/15 0406 11/20/15 0856  GLUCAP 179* 191* 176* 50* 160*    Microbiology: Results for orders placed or performed during the hospital encounter of 12/03/2015  Blood culture (routine x 2)     Status: None   Collection Time: 11/29/2015  6:01 PM  Result Value Ref Range Status   Specimen Description BLOOD LEFT HAND  Final   Special Requests BOTTLES DRAWN AEROBIC AND ANAEROBIC 4CC  Final   Culture  Setup Time   Final    GRAM POSITIVE COCCI IN PAIRS IN BOTH AEROBIC AND ANAEROBIC BOTTLES CRITICAL VALUE NOTED.  VALUE IS CONSISTENT WITH PREVIOUSLY REPORTED  AND CALLED VALUE.    Culture   Final    STREPTOCOCCUS PNEUMONIAE IN BOTH AEROBIC AND ANAEROBIC BOTTLES    Report Status 11/15/2015 FINAL  Final   Organism ID, Bacteria STREPTOCOCCUS PNEUMONIAE  Final      Susceptibility   Streptococcus pneumoniae - MIC*    ERYTHROMYCIN <=0.12 SENSITIVE Sensitive     VANCOMYCIN 0.5 SENSITIVE Sensitive     TRIMETH/SULFA <=10 SENSITIVE Sensitive     CLINDAMYCIN <=0.25 SENSITIVE Sensitive     CEFTRIAXONE Value in next row Sensitive      SENSITIVE<=0.12    PENICILLIN Value in next row Sensitive      SENSITIVE<=0.06    * STREPTOCOCCUS PNEUMONIAE  Blood culture (routine x 2)     Status: None   Collection Time: 12/01/2015  6:07 PM  Result Value Ref Range Status   Specimen  Description BLOOD LEFT ASSIST CONTROL  Final   Special Requests BOTTLES DRAWN AEROBIC AND ANAEROBIC [place]  Final   Culture  Setup Time   Final    GRAM POSITIVE COCCI IN PAIRS IN BOTH AEROBIC AND ANAEROBIC BOTTLES CRITICAL RESULT CALLED TO, READ BACK BY AND VERIFIED WITH: HANK ZOMPA AT 8502 ON 11/23/15 CTJ    Culture   Final    STREPTOCOCCUS PNEUMONIAE IN BOTH AEROBIC AND ANAEROBIC BOTTLES    Report Status 11/15/2015 FINAL  Final   Organism ID, Bacteria STREPTOCOCCUS PNEUMONIAE  Final      Susceptibility   Streptococcus pneumoniae - MIC*    ERYTHROMYCIN <=0.12 SENSITIVE Sensitive     VANCOMYCIN 0.5 SENSITIVE Sensitive     TRIMETH/SULFA <=10 SENSITIVE Sensitive     CLINDAMYCIN <=0.25 SENSITIVE Sensitive     CEFTRIAXONE Value in next row Sensitive      SENSITIVE<=0.12    PENICILLIN Value in next row Sensitive      SENSITIVE<=0.06    * STREPTOCOCCUS PNEUMONIAE  Blood Culture ID Panel (Reflexed)     Status: Abnormal   Collection Time: 11/15/2015  6:07 PM  Result Value Ref Range Status   Enterococcus species NOT DETECTED NOT DETECTED Final   Listeria monocytogenes NOT DETECTED NOT DETECTED Final   Staphylococcus species NOT DETECTED NOT DETECTED Final   Staphylococcus aureus NOT DETECTED NOT DETECTED Final   Streptococcus species DETECTED (A) NOT DETECTED Corrected    Comment: CRITICAL RESULT CALLED TO, READ BACK BY AND VERIFIED WITH: HANK ZOMPA AT 1220 ON 11/13/15 CTJ CORRECTED ON 01/06 AT 7741: PREVIOUSLY REPORTED AS DETECTED    Streptococcus agalactiae NOT DETECTED NOT DETECTED Final   Streptococcus pneumoniae DETECTED (A) NOT DETECTED Final    Comment: CRITICAL RESULT CALLED TO, READ BACK BY AND VERIFIED WITH: HANK ZOMPA AT 2878 ON 11/13/15 CTJ    Streptococcus pyogenes NOT DETECTED NOT DETECTED Final   Acinetobacter baumannii NOT DETECTED NOT DETECTED Final   Enterobacteriaceae species NOT DETECTED NOT DETECTED Final   Enterobacter cloacae complex NOT DETECTED NOT DETECTED Final    Escherichia coli NOT DETECTED NOT DETECTED Final   Klebsiella oxytoca NOT DETECTED NOT DETECTED Final   Klebsiella pneumoniae NOT DETECTED NOT DETECTED Final   Proteus species NOT DETECTED NOT DETECTED Final   Serratia marcescens NOT DETECTED NOT DETECTED Final   Haemophilus influenzae NOT DETECTED NOT DETECTED Final   Neisseria meningitidis NOT DETECTED NOT DETECTED Final   Pseudomonas aeruginosa NOT DETECTED NOT DETECTED Final   Candida albicans NOT DETECTED NOT DETECTED Final   Candida glabrata NOT DETECTED NOT DETECTED Final   Candida krusei NOT DETECTED  NOT DETECTED Final   Candida parapsilosis NOT DETECTED NOT DETECTED Final   Candida tropicalis NOT DETECTED NOT DETECTED Final   Carbapenem resistance NOT DETECTED NOT DETECTED Final   Methicillin resistance NOT DETECTED NOT DETECTED Final   Vancomycin resistance NOT DETECTED NOT DETECTED Final  MRSA PCR Screening     Status: None   Collection Time: 12/09/2015 10:01 PM  Result Value Ref Range Status   MRSA by PCR NEGATIVE NEGATIVE Final    Comment:        The GeneXpert MRSA Assay (FDA approved for NASAL specimens only), is one component of a comprehensive MRSA colonization surveillance program. It is not intended to diagnose MRSA infection nor to guide or monitor treatment for MRSA infections.   Culture, expectorated sputum-assessment     Status: None   Collection Time: 11/13/15  2:56 AM  Result Value Ref Range Status   Specimen Description SPUTUM  Final   Special Requests Normal  Final   Sputum evaluation   Final    Sputum specimen not acceptable for testing.  Please recollect.   CALLED BETH [place] AT 9024 11/13/15.PMH   Report Status 11/13/2015 FINAL  Final  Culture, expectorated sputum-assessment     Status: None   Collection Time: 11/17/15  9:57 AM  Result Value Ref Range Status   Specimen Description SPUTUM  Final   Special Requests Normal  Final   Sputum evaluation THIS SPECIMEN IS ACCEPTABLE FOR SPUTUM CULTURE  Final    Report Status 11/18/2015 FINAL  Final  Culture, respiratory (NON-Expectorated)     Status: None (Preliminary result)   Collection Time: 11/17/15  9:57 AM  Result Value Ref Range Status   Specimen Description SPUTUM  Final   Special Requests Normal Reflexed from [id number]  Final   Gram Stain   Final    FAIR SPECIMEN - 70-80% WBCS FEW WBC SEEN FEW YEAST    Culture MODERATE GROWTH YEAST IDENTIFICATION TO FOLLOW   Final   Report Status PENDING  Incomplete  CULTURE, BLOOD (ROUTINE X 2) w Reflex to PCR ID Panel     Status: None (Preliminary result)   Collection Time: 11/18/15 10:34 PM  Result Value Ref Range Status   Specimen Description BLOOD LEFT ANTECUBITAL  Final   Special Requests BOTTLES DRAWN AEROBIC AND ANAEROBIC 10ML  Final   Culture NO GROWTH 2 DAYS  Final   Report Status PENDING  Incomplete    Coagulation Studies: No results for input(s): LABPROT, INR in the last 72 hours.  Urinalysis: No results for input(s): COLORURINE, LABSPEC, PHURINE, GLUCOSEU, HGBUR, BILIRUBINUR, KETONESUR, PROTEINUR, UROBILINOGEN, NITRITE, LEUKOCYTESUR in the last 72 hours.  Invalid input(s): APPERANCEUR    Imaging: No results found.   Medications:   . feeding supplement (VITAL AF 1.2 CAL) 1,000 mL (11/20/15 0800)  . fentaNYL infusion INTRAVENOUS 50 mcg/hr (11/20/15 0800)  .  sodium bicarbonate 150 mEq in sterile water 1000 mL infusion 50 mL/hr at 11/20/15 0800   . antiseptic oral rinse  7 mL Mouth Rinse q12n4p  . aspirin  81 mg Oral Daily  .  ceFAZolin (ANCEF) IV  2 g Intravenous Q12H  . chlorhexidine  15 mL Mouth Rinse BID  . free water  30 mL Per Tube 6 times per day  . gemfibrozil  600 mg Oral BID  . insulin aspart  0-9 Units Subcutaneous 6 times per day  . ipratropium-albuterol  3 mL Nebulization Q4H  . lactulose  10 g Oral BID  . pantoprazole (PROTONIX) IV  40 mg Intravenous Q12H  . senna-docusate  1 tablet Oral Daily  . sodium chloride  10-40 mL Intracatheter Q12H  . sodium  chloride  3 mL Intravenous Q12H  . sodium chloride  3 mL Intravenous Q12H   sodium chloride, acetaminophen, albuterol, artificial tears, baclofen, sodium chloride, sodium chloride, sodium chloride  Assessment/ Plan:  Mr. Carl Perry is a 60 y.o. 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 Moderate concentric hypertrophy. Systolic function was mildly reduced. The estimated ejection fraction was in the range of 45% to 50%. There appears to be severe hypokinesis of the apical, lateral and anterolateral myocardium  1. Acute Renal Failure on Chronic kidney disease stage IV with nephrotic range proteinuria: baseline creatinine of 2.69, eGFR of 25, 10/21/2015 Acute renal failure from acute illness: acute MI, pneumonia, and hypotension.    Chronic kidney disease secondary with nephrotic syndrome to long standing diabetes mellitus insulin dependent type II, hypertension and nonsteroidal use.  -  family consensus at this point is that patient had poor quality of life and would not want to prolong with dialysis - will respect family wishes. No dialysis.  -d/c gabapentin due to renal failure    2. Diabetes Mellitus type II insulin dependent with chronic kidney disease:  With nephrotic range proteinuria.  - Continue glucose control.   3. Acute resp failure / Pneumonia / Strep Pneumonia Sepsis - intubated 11/17/15, vent assisted at present - mgmt as per ICU team      LOS: 8 Carl Perry 1/12/20179:20 AM

## 2015-11-20 NOTE — Consult Note (Signed)
PULMONARY PROGRESS NOTE   PT PROFILE: [age] M with DM 2, CAD, CHF, CKD (baseline Cr 2.41) adm 01/04 with acute respiratory failure, cough, purulent sputum of one week duration. CXR revealed RLL consolidation. Admitted with diagnosis of CAP to ICU/SDU for BiPAP after ABG revealed respiratory acidosis. Has ruled in for NSTEMI and has worsening renal function.  MAJOR EVENTS/TEST RESULTS: 01/05 TTE: The estimated ejection fraction was in the range of 45% to 50%. Regional wall motion abnormalities: There appears to be severe hypokinesis of the apical, lateral and anterolateral myocardium 01/05 Troponin I peak @ 33 Patient emergently intubated 1/9 Family dicussion:patient is DNR he would NOT want intubation or dialysis There was family drama yesterday per nursing staff   family discussion held yesterday, will pursue terminal wean and extubation when family is ready  INDWELLING DEVICES::ETT 1/9   MICRO DATA: Resp 01/05 >> inadequate specimen Blood 01/04 >> Pneumococcus  PCT 01/05  43.59, 41.25,   ANTIMICROBIALS:  Aztreonam 01/04 >> 01/05 Levofloxacin 01/04 >> 01/05 Ceftriaxone 01/05 >>   SUBJ:  GCS<8, intubated,sedated On full vent support fio2 at 40% -worsening kidney function -now with GIB   Filed Vitals:   11/20/15 0700 11/20/15 0730 11/20/15 0744 11/20/15 0800  BP: 102/57   120/107  Pulse: 86   86  Temp:  98.2 F (36.8 C)    TempSrc:  Axillary    Resp: 20   20  Height:      Weight:      SpO2: 94%  96% 94%     EXAM:  HEENT: NCAT, oropharynx normal Neck: Supple without LAN, thyromegaly,  No JVD noted Lungs: R basilar crackles, +resp distress Cardiovascular: Reg rhythm, no murmurs noted Abdomen: Soft, nontender, normal BS Ext: severe chronic stasis changes, symmetric, severe pretibial edema, partial amputation of R great toe GCS<8  DATA:   BMP Latest Ref Rng 11/20/2015 11/18/2015 11/17/2015  Glucose 65 - 99 mg/dL 161(W184(H) 99 960(A149(H)  BUN 6 - 20 mg/dL 540(J158(H) 811(B138(H)  147(W114(H)  Creatinine 0.61 - 1.24 mg/dL 2.95(A6.46(H) 2.13(Y5.80(H) 8.65(H4.86(H)  Sodium 135 - 145 mmol/L 136 132(L) 132(L)  Potassium 3.5 - 5.1 mmol/L 4.3 4.0 5.1  Chloride 101 - 111 mmol/L 91(L) 92(L) 92(L)  CO2 22 - 32 mmol/L 28 24 23   Calcium 8.9 - 10.3 mg/dL 7.8(L) 7.6(L) 8.6(L)    CBC Latest Ref Rng 11/18/2015 11/17/2015 11/16/2015  WBC 3.8 - 10.6 K/uL 15.5(H) 23.9(H) 17.5(H)  Hemoglobin 13.0 - 18.0 g/dL 7.8(L) 8.9(L) 8.4(L)  Hematocrit 40.0 - 52.0 % 24.2(L) 28.6(L) 26.9(L)  Platelets 150 - 440 K/uL 334 363 354    CXR:  NSC RLL consolidation  IMPRESSION:  60 yo white male admitted for acute strep pneumonia with bacteremia complicated by NSTEMI now with progressive resp failure and renal failure  Pneumococcal PNA with bacteremia and severe sepsis  Intubated,sedated-placed on full  Vent support NSTEMI - Cards following. Deemed stress induced due to primary diagnosis of PNA, severe sepsis Acute hypoxic/hypercarbic respiratory failure  Now intubated. H/O COPD without acute bronchospasm AKI on CKD (baseline Cr 2.41), nonoliguric  Renal following, nephrology, as discussed with the patient this morning about potentially starting dialysis. They're going to discuss this further and he is going to consider this. Mild hypokalemia - repleted 01/06 DM 2  Episodic hypoglycemia, which now appears to be controlled over the last 24 hours. Chronic anemia without overt acute blood loss Delirium.   PLAN:  1.Respiratory Failure-plan for terminal wean and extubation when family is ready. -continue Full MV  support -continue Bronchodilator Therapy -Wean Fio2 and PEEP as tolerated  2.Renal Failure-most likely due to ATN -follow chem 7 -follow UO -continue Foley Catheter-assess need  3.DVT/GI prx  Palliative care consulted.  After further discussion with family,  they are waiting for other family memebrs to arrive from out of town-plan to wean and extubate and anticipate comfort care measures and terminal wean  and extubation  I have personally obtained a history, examined the patient, evaluated Pertinent laboratory and RadioGraphic/imaging results, and  formulated the assessment and plan   The Patient requires high complexity decision making for assessment and support, frequent evaluation and titration of therapies, application of advanced monitoring technologies and extensive interpretation of multiple databases. Critical Care Time devoted to patient care services described in this note is 35 minutes.   Overall, patient is critically ill, prognosis is guarded.  Patient with Multiorgan failure and at high risk for cardiac arrest and death.    Lucie Leather, M.D.  [facility]  Medical Director [facility] Medical Director [facility] [place] Cardio-Pulmonary Department

## 2015-11-20 NOTE — Progress Notes (Signed)
[facility] [place] [facility]   PATIENT NAME: Carl SheerMichael Guion    MR#:  [id number]  DATE OF BIRTH:  14-Mar-1956  SUBJECTIVE:   Patient here due to acute respiratory failure from pneumonia and also noted to be Septic.  Also noted to have elevated troponin consistent with a non-ST elevation MI.  Intubated, on 40% FiO2, O2 sats at around 93%. Kidney function is worsening, no hemodialysis was wished for the patient and family. Urinary output of 653 cc over the past 24 hours  REVIEW OF SYSTEMS:    Review of Systems  Unable to perform ROS: intubated    Nutrition: NPO Tolerating Diet: No Tolerating PT: Await Eval   DRUG ALLERGIES:   Allergies  Allergen Reactions  . Statins Swelling and Other (See Comments)    Reaction:  Rhabdomyolysis  . Ivp Dye [Iodinated Diagnostic Agents] Other (See Comments)    Reaction:  Unknown   . Penicillins Rash and Other (See Comments)    Unable to obtain enough information to answer additional questions about this medication.      VITALS:  Blood pressure 120/107, pulse 86, temperature 98.2 F (36.8 C), temperature source Axillary, resp. rate 20, height 5\' 9"  (1.753 m), weight 132.4 kg (291 lb 14.2 oz), SpO2 95 %.  PHYSICAL EXAMINATION:   Physical Exam  GENERAL:  60 y.o.-year-old obese patient lying in the bed in no significant distress, now intubated and sedated, pale gray-looking complexion, intermittent different body parts myoclonus  EYES: Pupils equal, round, reactive to light and accommodation. No scleral icterus. Extraocular muscles difficult to obtain.  HEENT: Head atraumatic, normocephalic. Oropharynx and nasopharynx clear. ET tube as well as OG tube are in place,  NECK:  Supple, no jugular venous distention. No thyroid enlargement, no tenderness.  LUNGS: diminished a/e b/l, scattered crepitations , rales and rhonchi bilaterally.   CARDIOVASCULAR: S1, S2 tachycardic. No murmurs, rubs, or gallops.  ABDOMEN: Soft,  nontender, nondistended. Bowel sounds present. No organomegaly or mass.  EXTREMITIES: No cyanosis, clubbing , 2+ edema b/l.   NEUROLOGIC: Cranial nerves unable to evaluate. Myoclonus PSYCHIATRIC: The patient is off sedation, not responding to verbal stimuli, briefly opens eyes and wonders around SKIN: No obvious rash, lesion, or ulcer. Signs of chronic venous stasis bilaterally   LABORATORY PANEL:   CBC  Recent Labs Lab 11/18/15 0516  WBC 15.5*  HGB 7.8*  HCT 24.2*  PLT 334   ------------------------------------------------------------------------------------------------------------------  Chemistries   Recent Labs Lab 11/17/15 0405  11/20/15 0433  NA 132*  < > 136  K 5.1  < > 4.3  CL 92*  < > 91*  CO2 23  < > 28  GLUCOSE 149*  < > 184*  BUN 114*  < > 158*  CREATININE 4.86*  < > 6.46*  CALCIUM 8.6*  < > 7.8*  MG 2.6*  --   --   < > = values in this interval not displayed. ------------------------------------------------------------------------------------------------------------------  Cardiac Enzymes  Recent Labs Lab 11/14/15 1050  TROPONINI 15.27*   ------------------------------------------------------------------------------------------------------------------  RADIOLOGY:  No results found.   ASSESSMENT AND PLAN:   60 year old male with past medical history of COPD, diabetes, hypertension, chronic kidney disease stage IV, obstructive sleep apnea, hyperlipidemia, morbid obesity, depression, chronic diastolic CHF, anemia of chronic disease who presented to the hospital due to shortness of breath and noted to be in acute respiratory failure.  #1 acute respiratory failure with hypoxia and hypercapnia due to underlying pneumonia. -Continue IV ceftriaxone, sputum cultures were  unremarkable. However  blood cultures grew Streptococcus pneumonia. Continue mechanical ventilation , patient's extubation will be terminal, per pulmonologist, but patient's family is not  ready to extubate him today. No reintubation.    #2 pneumonia-suspect streptococcal pneumonia, although sputum culture done on 11/13/2015 is normal. Patient's blood cultures are positive for Streptococcus. -Continue IV ceftriaxone. sputum cultures is negative as above.  #3 non-ST elevation MI-patient has ruled in by cardiac markers. He remains clinically asymptomatic though. Medical therapy was recommended by cardiologist due to renal failure The patient was on ASA,  heparin subcutaneously, however, developed gastrointestinal bleed and a bolus of these indications were placed on hold. Appreciate cardiology input, and continue medical management. Patient is not on beta blockers , nitroglycerin given his hypotension and sepsis.  #4 sepsis due to Streptococcus pneumonia secondary to pneumonia. -Continue IV ceftriaxone. blood cultures which are growing Streptococcus. Clinically stable  #5 acute on chronic renal failure due to ATN, now oliguric-he has baseline CK disease stage IV. He does not want dialysis, even temporarily. - Baseline Cr. 2.6 in 12/16 and today 6.46.  Likely Cardiorenal in nature given his NSTEMI.  Urine output is low, about 600 over the past 24 hours   -Nephrology has been consulted and continue care as per them. The patient did not desire hemodialysis. Will respect patient's wishes  #6 hypokalemia-resolved  #7 depression - continue Elavil, Effexor.   #8 diabetes type 2 without complication-continue sliding scale insulin.  #9 diabetic neuropathy- discontinued Neurontin due to renal failure.  #10. Upper Gastrointestinal bleed, of aspirin as well as heparin now on Protonix intravenously twice a day, hemoglobin level is declining, no transfusion is planned due to palliation/hospice plans  All the records are reviewed and case discussed with Care Management/Social Workerr. Management plans discussed with the patient, family and they are in agreement.  CODE STATUS: Full   DVT  Prophylaxis: None  TOTAL Critical Care TIME TAKING CARE OF THIS PATIENT: 40 minutes.       Katharina Caper M.D on 11/20/2015 at 2:12 PM  Between 7am to 6pm - Pager - [phone]  After 6pm go to [email or web address] - password [email or web address] [facility]  [facility]  Office  [phone]  CC: Primary care physician; Ruel Favors, MD

## 2015-11-21 DIAGNOSIS — N185 Chronic kidney disease, stage 5: Secondary | ICD-10-CM

## 2015-11-21 DIAGNOSIS — E1122 Type 2 diabetes mellitus with diabetic chronic kidney disease: Secondary | ICD-10-CM

## 2015-11-21 DIAGNOSIS — I5032 Chronic diastolic (congestive) heart failure: Secondary | ICD-10-CM

## 2015-11-21 DIAGNOSIS — Z9911 Dependence on respirator [ventilator] status: Secondary | ICD-10-CM

## 2015-11-21 DIAGNOSIS — Z515 Encounter for palliative care: Secondary | ICD-10-CM

## 2015-11-21 DIAGNOSIS — K769 Liver disease, unspecified: Secondary | ICD-10-CM

## 2015-11-21 DIAGNOSIS — E871 Hypo-osmolality and hyponatremia: Secondary | ICD-10-CM

## 2015-11-21 DIAGNOSIS — J154 Pneumonia due to other streptococci: Secondary | ICD-10-CM

## 2015-11-21 LAB — CULTURE, RESPIRATORY W GRAM STAIN

## 2015-11-21 LAB — CULTURE, RESPIRATORY: SPECIAL REQUESTS: NORMAL

## 2015-11-21 LAB — GLUCOSE, CAPILLARY
GLUCOSE-CAPILLARY: 102 mg/dL — AB (ref 65–99)
GLUCOSE-CAPILLARY: 113 mg/dL — AB (ref 65–99)
GLUCOSE-CAPILLARY: 118 mg/dL — AB (ref 65–99)
GLUCOSE-CAPILLARY: 143 mg/dL — AB (ref 65–99)
Glucose-Capillary: 118 mg/dL — ABNORMAL HIGH (ref 65–99)

## 2015-11-21 MED ORDER — FENTANYL 2500MCG IN NS 250ML (10MCG/ML) PREMIX INFUSION
100.0000 ug/h | INTRAVENOUS | Status: DC
  Administered 2015-11-21: 100 ug/h via INTRAVENOUS

## 2015-11-21 MED ORDER — FENTANYL BOLUS VIA INFUSION: 50.0000 ug | INTRAVENOUS | Status: DC | PRN

## 2015-11-21 MED ORDER — PANTOPRAZOLE SODIUM 40 MG PO PACK: 40.0000 mg | PACK | Freq: Two times a day (BID) | ORAL | Status: DC

## 2015-11-21 MED ORDER — PANTOPRAZOLE SODIUM 40 MG IV SOLR: 40.0000 mg | Freq: Two times a day (BID) | INTRAVENOUS | Status: DC

## 2015-11-21 NOTE — Consult Note (Signed)
Palliative Medicine Inpatient Consult Note   Name: Carl Perry Date:  MRN: [id number]  DOB: July 04, 1956  Referring Physician: Theodoro Grist, MD  Palliative Care consult requested for this 60 y.o. male for goals of medical therapy in patient with end stage lung and kidney disease. Dr Mortimer Fries had related to me that the pt had stated when he was 'clear-headed' early on 'do not put me on a ventilator or dialysis'.  He is on the vent and cannot come off and his kidneys have failed further and he would need dialysis if aggressive care was desired.  I had been told that terminal extubation was planned for today and that I could assist with symptom management at and after that time.     TODAY'S DISCUSSIONS AND DECISIONS: I had met the patient's sister a few times in ICU before today and I had reviewed the chart.  I was aware that a terminal extubation was planned to take place today, but there was not a time set.  I asked Dr Mortimer Fries if I could contribute to this process (or not--essentially asking if I was needed since things seemed to be lined up with pt and family).  Dr Mortimer Fries agreed that my help would be of benefit.  I then updated myself on the patient's latest labs etc. I went into the room with the patient's nurse.  The pt's wife was holding pts hand and was quiet through this visit.  A daughter was in the room. Pts sister was present and when I asked if they had any time in mind, the sister said it would be up to her brother --who had his eyes open.  She then started asking him a lot of questions about dialysis and coming off the tube. I tried to simplify some of the questions, because pt was being asked complex questions (such as "Do you remember that you said you do not want to get dialysis?"  Then when he would nod yes, we could not know if he was answering that he remembered this OR that he DOES want dialysis.  This continued for a while.  I recommended no further questions until we could  stop the Fentanyl and see if we could get consistent answers stated simply.  I wrote some questions out and showed them to Dr. Mortimer Fries and let family know I would try these questions first.  The patient was still answering inconsistently until he was told that he would not be living at home if he was to stay on the ventilator.  Then, he consistently nodded in agreement that he would want the vent stopped (I suggested 3pm).    Some family tried to come into the room and I asked that they wait --since I was at a key point in the discussion with pt (apparently this daughter is one who is quite emotional).  His sister then continued to ask pt  multiple questions in a row without pausing to allow him to respond.  I made a suggestion that they might want to just use statements to allow pt to rest.  I also noted multiple family members might not fit into the room at 3pm, so they would need to decide on who wanted to be there and who wanted to come and go --with suggestions being made by one daughter that some family members would not want to be there and others would.   Apparently, the patient's sister took offense at me suggesting they speak in statements for a  while to let him rest.  (She had asked him three back to back questions without pausing and pt was struggling to answer).  The sister decided that she did not want me involved in this process and conveyed this to nurse, Clyde Canterbury.  Clyde Canterbury conveyed this to me and stated that she would pass on the info to Dr Mortimer Fries. Dr Mortimer Fries has been working with the family for many days and they have developed a trust with him.    I had thought I was going to be involved in symptom management during and after a terminal withdrawal, but it turned out to be a lot of chaos with the patient being asked (again apparently) about his wishes (by his sister while sedated and then later by me with Fentanyl stopped). There were multiple family members coming and going and some of these were  apparently upset with other family members.  I have called Dr Norlene Duel and explained the above and she states that she knows that the sister has told her that she cannot handle this --and this is driving her to try to get different answers from the pt.  Dr Norlene Duel and Dr Mortimer Fries understand and will be managing whatever occurs (or does not occur) henceforth in terms of this formerly planned terminal extubation.    I am signing off.      IMPRESSION: Acute Hypoxic Respiratory Failure ---due to strep pneumonia ---also due to worsening COPD ---intubated 11/17/15 Sepsis NonSTEMI Nephrotic Syndrome Acute Renal Failure ---worsening (3.24 -5.8 --now 6.46) ---urine output was 653 in 24 hrs yesterday ---family feels pt would not want dialysis given poor quality of life Chronic Kidney Disease stage IV Chronic diastolic CHF Anemia of chronic Disease (Hgb 7.8) Vomiting (of tube feeding) ---started NG at low intermittent suction yesterday Hyponatremia Leukocytosis Diabetic neuropathy H/O depression Hypokalemia GI bleed --with ASA for MI held DM2  REVIEW OF SYSTEMS:  Patient is not able to provide ROS due to illness  SPIRITUAL SUPPORT SYSTEM: Yes  -family.  SOCIAL HISTORY:  reports that he quit smoking about 5 years ago. His smoking use included Cigarettes. He has a 70 pack-year smoking history. He has never used smokeless tobacco. He reports that he does not drink alcohol or use illicit drugs.  LEGAL DOCUMENTS:    CODE STATUS: DNR  PAST MEDICAL HISTORY: Past Medical History  Diagnosis Date  . COPD (chronic obstructive pulmonary disease) (HCC)     a. on intermittent home O2  . Diabetes mellitus ([place])   . Hypertension   . CKD (chronic kidney disease), stage IV ([place])   . Sleep apnea   . Rhabdomyolysis   . Hyperlipidemia   . Morbid obesity ([place])   . Depression   . Chronic diastolic CHF (congestive heart failure) ([place])     a. echo 11/2013: EF 60-65%, mild LVH, mildly increased LV  internal cavity size, moderately increased LV posterior wall thickness; b. echo 1/17: EF 45-50%, severe HK of apical, lateral, & anterolateral myocardium, technically difficult images other regions of WMA cannot be excluded, GR1DD, mild MR, LA mild to mod dilated, PASP nl  . Allergy   . Chronic liver failure ([place])   . Anemia of chronic disease   . Polysubstance abuse     a. prior abuse of benzos and acid  . PNA (pneumonia)     a. recurrent PNA 10/2015 and 11/2015 leading to acute respiratory failure    PAST SURGICAL HISTORY:  Past Surgical History  Procedure Laterality Date  . Toe  amputation Right   . Foot surgery Right   . Tonsillectomy    . Abdominal hernia repair  1989  . Cardiac catheterization      [facility] no stents    ALLERGIES:  is allergic to statins; ivp dye; and penicillins.  MEDICATIONS:  Current Facility-Administered Medications  Medication Dose Route Frequency Provider Last Rate Last Dose  . 0.9 %  sodium chloride infusion  250 mL Intravenous PRN Theodoro Grist, MD      . acetaminophen (TYLENOL) tablet 650 mg  650 mg Oral Q6H PRN Henreitta Leber, MD   650 mg at 11/18/15 2153  . albuterol (PROVENTIL) (2.5 MG/3ML) 0.083% nebulizer solution 2.5 mg  2.5 mg Nebulization Q4H PRN Wilhelmina Mcardle, MD   2.5 mg at 11/13/15 1121  . antiseptic oral rinse (CPC / CETYLPYRIDINIUM CHLORIDE 0.05%) solution 7 mL  7 mL Mouth Rinse q12n4p Theodoro Grist, MD   7 mL at 11/20/15 1629  . artificial tears (LACRILUBE) ophthalmic ointment   Both Eyes Q4H PRN Flora Lipps, MD      . aspirin chewable tablet 81 mg  81 mg Oral Daily Flora Lipps, MD   81 mg at 11/20/15 0955  . baclofen (LIORESAL) tablet 20 mg  20 mg Oral QHS PRN Theodoro Grist, MD      . ceFAZolin (ANCEF) IVPB 2 g/50 mL premix  2 g Intravenous Q12H Vena Rua, RPH   2 g at 11/20/15 2259  . chlorhexidine (PERIDEX) 0.12 % solution 15 mL  15 mL Mouth Rinse BID Theodoro Grist, MD   15 mL at 11/20/15 2259  . feeding supplement (VITAL AF  1.2 CAL) liquid 1,000 mL  1,000 mL Per Tube Continuous Flora Lipps, MD   Stopped at 11/20/15 1130  . fentaNYL 2589mg in NS 2522m(101mml) infusion-PREMIX  0-400 mcg/hr Intravenous Continuous KurFlora LippsD 10 mL/hr at  0900 100 mcg/hr at  0900  . free water 30 mL  30 mL Per Tube 6 times per day KurFlora LippsD   30 mL at 11/20/15 2000  . insulin aspart (novoLOG) injection 0-9 Units  0-9 Units Subcutaneous 6 times per day SteAnders SimmondsD   2 Units at 11/20/15 1425  . ipratropium-albuterol (DUONEB) 0.5-2.5 (3) MG/3ML nebulizer solution 3 mL  3 mL Nebulization Q4H KurFlora LippsD   3 mL at  0802  . LORazepam (ATIVAN) injection 2 mg  2 mg Intravenous Q1H PRN KurFlora LippsD   2 mg at 11/20/15 1441  . pantoprazole (PROTONIX) injection 40 mg  40 mg Intravenous Q12H RimTheodoro GristD   40 mg at 11/20/15 2259  . sodium bicarbonate 150 mEq in sterile water 1,000 mL infusion   Intravenous Continuous KurFlora LippsD 50 mL/hr at  0900    . sodium chloride (OCEAN) 0.65 % nasal spray 1 spray  1 spray Each Nare PRN JenJavier GlazierD   1 spray at 11/15/15 1744  . sodium chloride 0.9 % injection 10-40 mL  10-40 mL Intracatheter Q12H KurFlora LippsD   10 mL at 11/20/15 2200  . sodium chloride 0.9 % injection 10-40 mL  10-40 mL Intracatheter PRN KurFlora LippsD      . sodium chloride 0.9 % injection 3 mL  3 mL Intravenous Q12H RimTheodoro GristD   3 mL at 11/20/15 2200  . sodium chloride 0.9 % injection 3 mL  3 mL Intravenous Q12H RimTheodoro GristD   3 mL at 11/20/15 2200  .  sodium chloride 0.9 % injection 3 mL  3 mL Intravenous PRN Theodoro Grist, MD        Vital Signs: BP 101/61 mmHg  Pulse 81  Temp(Src) 99.3 F (37.4 C) (Oral)  Resp 17  Ht '5\' 9"'$  (1.753 m)  Wt 131.5 kg (289 lb 14.5 oz)  BMI 42.79 kg/m2  SpO2 96% Filed Weights   11/19/15 0449 11/20/15 0451  0500  Weight: 131.5 kg (289 lb 14.5 oz) 132.4 kg (291 lb 14.2 oz) 131.5 kg (289 lb 14.5 oz)     Estimated body mass index is 42.79 kg/(m^2) as calculated from the following:   Height as of this encounter: '5\' 9"'$  (1.753 m).   Weight as of this encounter: 131.5 kg (289 lb 14.5 oz).  PERFORMANCE STATUS (ECOG) : 4 - Bedbound  PHYSICAL EXAM: Lying in CCU, ventilated, with eyes open halfway despite Fentanyl Eyes are at times disconjugate  Pt nods --but while on Fentanyl, his responses were inconsistent Hrt rrr m Lungs vent sounds  Abd soft and NT Skin warm and dry Pt does not appear to be alert enough to be answering the complex (two and three part) questions he is being asked by his sister.  Later, with Fentanyl off, he was better with this and indicated he did not want dialysis or the vent since it would mean not living at home.   LABS: CBC:    Component Value Date/Time   WBC 15.5* 11/18/2015 0516   WBC 7.8 03/05/2015 0441   HGB 7.8* 11/18/2015 0516   HGB 10.8* 03/05/2015 0441   HCT 24.2* 11/18/2015 0516   HCT 32.8* 03/05/2015 0441   PLT 334 11/18/2015 0516   PLT 373 03/05/2015 0441   MCV 75.5* 11/18/2015 0516   MCV 80 03/05/2015 0441   NEUTROABS 22.8* 11/15/2015 1801   NEUTROABS 4.1 03/05/2015 0441   LYMPHSABS 2.2 11/11/2015 1801   LYMPHSABS 2.8 03/05/2015 0441   MONOABS 1.0 11/25/2015 1801   MONOABS 0.8 03/05/2015 0441   EOSABS 0.0 12/05/2015 1801   EOSABS 0.1 03/05/2015 0441   BASOSABS 0.0 11/29/2015 1801   BASOSABS 0.0 03/05/2015 0441   Comprehensive Metabolic Panel:    Component Value Date/Time   NA 136 11/20/2015 0433   NA 137 03/08/2015 0405   K 4.3 11/20/2015 0433   K 3.4* 03/08/2015 0405   CL 91* 11/20/2015 0433   CL 94* 03/08/2015 0405   CO2 28 11/20/2015 0433   CO2 32 03/08/2015 0405   BUN 158* 11/20/2015 0433   BUN 82* 03/08/2015 0405   CREATININE 6.46* 11/20/2015 0433   CREATININE 2.41* 03/08/2015 0405   GLUCOSE 184* 11/20/2015 0433   GLUCOSE 111* 03/08/2015 0405   CALCIUM 7.8* 11/20/2015 0433   CALCIUM 9.3 03/08/2015 0405   AST 18  11/15/2015 1801   AST 21 03/04/2015 1150   ALT 11* 11/27/2015 1801   ALT 15* 03/04/2015 1150   ALKPHOS 73  1801   ALKPHOS 74 03/04/2015 1150   BILITOT 0.6 11/17/2015 1801   BILITOT 0.4 03/04/2015 1150   PROT 8.9* 11/26/2015 1801   PROT 8.3* 03/04/2015 1150   ALBUMIN 2.6* 11/30/2015 1801   ALBUMIN 3.1* 03/04/2015 1150    More than 50% of the visit was spent in counseling/coordination of care: Yes  Time Spent:   80 minutes

## 2015-11-21 NOTE — Progress Notes (Addendum)
[facility] [place] [facility]   PATIENT NAME: Carl SheerMichael Perry    MR#:  [id number]  DATE OF BIRTH:  1956-03-29  SUBJECTIVE:   Patient here due to acute respiratory failure from pneumonia and also noted to be Septic.  Also noted to have elevated troponin consistent with a non-ST elevation MI.  Intubated, on 40% FiO2, O2 sats at around 93%. Kidney function is worsening, no hemodialysis was wished for the patient and family. Urinary output of 650 cc over the past 24 hours. Terminal extubation is being planned by patient's family. She is alert and oriented, answers some simple questions by nodding or shaking, denies any pain  REVIEW OF SYSTEMS:    Review of Systems  Unable to perform ROS: intubated    Nutrition: NPO Tolerating Diet: No Tolerating PT: Await Eval   DRUG ALLERGIES:   Allergies  Allergen Reactions  . Statins Swelling and Other (See Comments)    Reaction:  Rhabdomyolysis  . Ivp Dye [Iodinated Diagnostic Agents] Other (See Comments)    Reaction:  Unknown   . Penicillins Rash and Other (See Comments)    Unable to obtain enough information to answer additional questions about this medication.      VITALS:  Blood pressure 106/70, pulse 78, temperature 99 F (37.2 C), temperature source Oral, resp. rate 22, height 5\' 9"  (1.753 m), weight 131.5 kg (289 lb 14.5 oz), SpO2 95 %.  PHYSICAL EXAMINATION:   Physical Exam  GENERAL:  60 y.o.-year-old obese patient lying in the bed in no significant distress, now intubated and sedated, pale gray-looking complexion, intermittent different body parts myoclonus  EYES: Pupils equal, round, reactive to light and accommodation. No scleral icterus. Extraocular muscles difficult to obtain.  HEENT: Head atraumatic, normocephalic. Oropharynx and nasopharynx clear. ET tube as well as OG tube are in place,  NECK:  Supple, no jugular venous distention. No thyroid enlargement, no tenderness.  LUNGS: diminished a/e  b/l, scattered crepitations , rales and rhonchi bilaterally.   CARDIOVASCULAR: S1, S2 tachycardic. No murmurs, rubs, or gallops.  ABDOMEN: Soft, nontender, nondistended. Bowel sounds present. No organomegaly or mass.  EXTREMITIES: No cyanosis, clubbing , 2+ edema b/l.   NEUROLOGIC: Cranial nerves unable to evaluate. Myoclonus PSYCHIATRIC: The patient is off sedation, not responding to verbal stimuli, briefly opens eyes and wonders around SKIN: No obvious rash, lesion, or ulcer. Signs of chronic venous stasis bilaterally   LABORATORY PANEL:   CBC  Recent Labs Lab 11/18/15 0516  WBC 15.5*  HGB 7.8*  HCT 24.2*  PLT 334   ------------------------------------------------------------------------------------------------------------------  Chemistries   Recent Labs Lab 11/17/15 0405  11/20/15 0433  NA 132*  < > 136  K 5.1  < > 4.3  CL 92*  < > 91*  CO2 23  < > 28  GLUCOSE 149*  < > 184*  BUN 114*  < > 158*  CREATININE 4.86*  < > 6.46*  CALCIUM 8.6*  < > 7.8*  MG 2.6*  --   --   < > = values in this interval not displayed. ------------------------------------------------------------------------------------------------------------------  Cardiac Enzymes No results for input(s): TROPONINI in the last 168 hours. ------------------------------------------------------------------------------------------------------------------  RADIOLOGY:  No results found.   ASSESSMENT AND PLAN:   60 year old male with past medical history of COPD, diabetes, hypertension, chronic kidney disease stage IV, obstructive sleep apnea, hyperlipidemia, morbid obesity, depression, chronic diastolic CHF, anemia of chronic disease who presented to the hospital due to shortness of breath and noted to be  in acute respiratory failure.  #1 acute respiratory failure with hypoxia and hypercapnia due to underlying pneumonia. -Continue IV ceftriaxone, sputum cultures were unremarkable. However  blood cultures  grew Streptococcus pneumonia. Continue mechanical ventilation , patient's extubation will be terminal, likely today   #2 pneumonia-suspect streptococcal pneumonia, although sputum culture done on 11/13/2015 is normal. Patient's blood cultures are positive for Streptococcus. -Continue IV ceftriaxone unless comfort measures. sputum cultures is negative as above.  #3 non-ST elevation MI-patient has ruled in by cardiac markers. He remains clinically asymptomatic though. Medical therapy was recommended by cardiologist due to renal failure The patient was on ASA,  heparin subcutaneously, however, developed gastrointestinal bleed and these medications were placed on hold. Appreciate cardiology input, and continue medical management. Patient is not on beta blockers , nitroglycerin given his hypotension and sepsis.  #4 sepsis due to Streptococcus pneumonia secondary to pneumonia. -Continue IV ceftriaxone. blood cultures which are growing Streptococcus. Clinically stable  #5 acute on chronic renal failure due to ATN, now oliguric-he has baseline CK disease stage IV. He does not want dialysis, even temporarily, he expressed this very clearly in front of family and physicians. - Baseline Cr. 2.6 in 12/16 and yesterday 6.46.  Likely Cardiorenal in nature given his NSTEMI.  Urine output is low, about 600 over the past 24 hours   -Nephrology has been consulted and continue care as per them. The patient did not desire hemodialysis. Will respect patient's wishes, likely terminal extubation today  #6 hypokalemia-resolved  #7 depression - continue Elavil, Effexor.   #8 diabetes type 2 without complication-continue sliding scale insulin.  #9 diabetic neuropathy- discontinued Neurontin due to renal failure.  #10. Upper Gastrointestinal bleed, of aspirin as well as heparin now on Protonix intravenously twice a day, hemoglobin level is declining, no transfusion is planned due to palliation/hospice plans  All the  records are reviewed and case discussed with Care Management/Social Workerr. Management plans discussed with the patient, family and they are in agreement.  CODE STATUS: Full   DVT Prophylaxis: None  TOTAL Critical Care TIME TAKING CARE OF THIS PATIENT: 50 minutes.  Discussed with intensivist as well as patient's family, also prolonged discussion with Dr. Barbie Banner M.D on  at 2:54 PM  Between 7am to 6pm - Pager - [phone]  After 6pm go to [email or web address] - password [email or web address] [facility]  [facility]  Office  [phone]  CC: Primary care physician; Ruel Favors, MD

## 2015-11-21 NOTE — Consult Note (Signed)
PULMONARY PROGRESS NOTE   PT PROFILE: [age] M with DM 2, CAD, CHF, CKD (baseline Cr 2.41) adm 01/04 with acute respiratory failure, cough, purulent sputum of one week duration. CXR revealed RLL consolidation. Admitted with diagnosis of CAP to ICU/SDU for BiPAP after ABG revealed respiratory acidosis. Has ruled in for NSTEMI and has worsening renal function.  MAJOR EVENTS/TEST RESULTS: 01/05 TTE: The estimated ejection fraction was in the range of 45% to 50%. Regional wall motion abnormalities: There appears to be severe hypokinesis of the apical, lateral and anterolateral myocardium 01/05 Troponin I peak @ 33 Patient emergently intubated 1/9 Family dicussion:patient is DNR he would NOT want intubation or dialysis There was family drama yesterday per nursing staff  will pursue terminal wean and extubation when family is ready  INDWELLING DEVICES::ETT 1/9   MICRO DATA: Resp 01/05 >> inadequate specimen Blood 01/04 >> Pneumococcus  PCT 01/05  43.59, 41.25,   ANTIMICROBIALS:  Aztreonam 01/04 >> 01/05 Levofloxacin 01/04 >> 01/05 Ceftriaxone 01/05 >>   SUBJ:  GCS<8, intubated,sedated On full vent support fio2 at 40% -worsening kidney function -now with GIB -sedation  As needed   Filed Vitals:    0412  0500  0700  0800  BP:  97/56 93/58 99/57   Pulse:  82 75 80  Temp:    99.3 F (37.4 C)  TempSrc:    Oral  Resp:  20 20 21   Height:      Weight:  289 lb 14.5 oz (131.5 kg)    SpO2: 95% 94% 95% 95%     EXAM:  HEENT: NCAT, oropharynx normal Neck: Supple without LAN, thyromegaly,  No JVD noted Lungs: R basilar crackles, +resp distress Cardiovascular: Reg rhythm, no murmurs noted Abdomen: Soft, nontender, normal BS Ext: severe chronic stasis changes, symmetric, severe pretibial edema, partial amputation of R great toe GCS<8  DATA:   BMP Latest Ref Rng 11/20/2015 11/18/2015 11/17/2015  Glucose 65 - 99 mg/dL 161(W184(H) 99 960(A149(H)  BUN 6 - 20 mg/dL  540(J158(H) 811(B138(H) 147(W114(H)  Creatinine 0.61 - 1.24 mg/dL 2.95(A6.46(H) 2.13(Y5.80(H) 8.65(H4.86(H)  Sodium 135 - 145 mmol/L 136 132(L) 132(L)  Potassium 3.5 - 5.1 mmol/L 4.3 4.0 5.1  Chloride 101 - 111 mmol/L 91(L) 92(L) 92(L)  CO2 22 - 32 mmol/L 28 24 23   Calcium 8.9 - 10.3 mg/dL 7.8(L) 7.6(L) 8.6(L)    CBC Latest Ref Rng 11/18/2015 11/17/2015 11/16/2015  WBC 3.8 - 10.6 K/uL 15.5(H) 23.9(H) 17.5(H)  Hemoglobin 13.0 - 18.0 g/dL 7.8(L) 8.9(L) 8.4(L)  Hematocrit 40.0 - 52.0 % 24.2(L) 28.6(L) 26.9(L)  Platelets 150 - 440 K/uL 334 363 354    CXR:  NSC RLL consolidation  IMPRESSION:  60 yo white male admitted for acute strep pneumonia with bacteremia complicated by NSTEMI now with progressive resp failure and renal failure  Pneumococcal PNA with bacteremia and severe sepsis  Intubated,sedated-placed on full  Vent support NSTEMI - Cards following. Deemed stress induced due to primary diagnosis of PNA, severe sepsis Acute hypoxic/hypercarbic respiratory failure  Now intubated. H/O COPD without acute bronchospasm AKI on CKD (baseline Cr 2.41), nonoliguric  Renal following, nephrology, as discussed with the patient this morning about potentially starting dialysis. They're going to discuss this further and he is going to consider this. Mild hypokalemia - repleted 01/06 DM 2  Episodic hypoglycemia, which now appears to be controlled over the last 24 hours. Chronic anemia without overt acute blood loss Delirium.   PLAN:  1.Respiratory Failure-plan for terminal wean and extubation when family is  ready-possibly today -continue Full MV support -continue Bronchodilator Therapy -Wean Fio2 and PEEP as tolerated  2.Renal Failure-most likely due to ATN -follow chem 7 -follow UO -continue Foley Catheter-assess need  3.DVT/GI prx  Palliative care consulted.  After further discussion with family,  they are waiting for other family memebrs to arrive from out of town-plan to wean and extubate and anticipate comfort care  measures and terminal wean and extubation  The Patient requires high complexity decision making for assessment and support, frequent evaluation and titration of therapies, application of advanced monitoring technologies and extensive interpretation of multiple databases. Critical Care Time devoted to patient care services described in this note is 35 minutes.   Overall, patient is critically ill, prognosis is guarded.  Patient with Multiorgan failure and at high risk for cardiac arrest and death.    Lucie Leather, M.D.  [facility]  Medical Director [facility] Medical Director [facility] Cardio-Pulmonary Department

## 2015-11-21 NOTE — Progress Notes (Signed)
eLink Physician-Brief Progress Note Patient Name: Haynes HoehnMichael E Gentry DOB: 03-14-1956 MRN: [id number]   Date of Service    HPI/Events of Note  Patient extubated and plans are for no reintubation. Spoke with the patient's wife, Griselda MinerRachel Gropp, who reiterates the wishes of the patient and herself that [name] be kept comfortable and be allowed to pass with comfort and dignity. Will make the patient comfort measures, D/C all supportive care and continue medications and measures for comfort and pain.  eICU Interventions  Will order" 1. Comfort care. 2. Fentanyl IV infusion and bolus via infusion PRN.     Intervention Category Minor Interventions: Communication with other healthcare providers and/or family;Agitation / anxiety - evaluation and management  Lenell AntuSommer,Steven Eugene , 7:25 PM

## 2015-11-21 NOTE — Progress Notes (Signed)
°   12/06/2015 1400  Clinical Encounter Type  Visited With Patient and family together;Health care provider  Visit Type Follow-up;Spiritual support  Referral From Family  Consult/Referral To Chaplain  Spiritual Encounters  Spiritual Needs Prayer;Emotional  Stress Factors  Patient Stress Factors Not reviewed  Family Stress Factors Exhausted;Health changes;Loss  Chaplain asked to visit family as a follow-up from previous interactions.  Prayed and offered spiritual support. Chaplain Larose Kells Ext [phone]

## 2015-11-21 NOTE — Progress Notes (Signed)
Wake up Assessment [facility]

## 2015-11-21 NOTE — Progress Notes (Signed)
PHARMACY - CRITICAL CARE PROGRESS NOTE  Pharmacy Consult for cefazolin Indication: bacteremia     Allergies  Allergen Reactions  . Statins Swelling and Other (See Comments)    Reaction:  Rhabdomyolysis  . Ivp Dye [Iodinated Diagnostic Agents] Other (See Comments)    Reaction:  Unknown   . Penicillins Rash and Other (See Comments)    Unable to obtain enough information to answer additional questions about this medication.      Patient Measurements: Height: 5\' 9"  (175.3 cm) Weight: 289 lb 14.5 oz (131.5 kg) IBW/kg (Calculated) : 70.7   Vital Signs: Temp: 99 F (37.2 C) (01/13 1200) Temp Source: Oral (01/13 1200) BP: 106/70 mmHg (01/13 1300) Pulse Rate: 78 (01/13 1300) Intake/Output from previous day: 01/12 0701 - 01/13 0700 In: 1713.7 [I.V.:1483.7; NG/GT:130; IV Piggyback:100] Out: 1055 [Urine:655; Emesis/NG output:400] Intake/Output from this shift: Total I/O In: 394.4 [I.V.:394.4] Out: -  Vent settings for last 24 hours: Vent Mode:  [-] PRVC FiO2 (%):  [40 %] 40 % Set Rate:  [20 bmp] 20 bmp Vt Set:  [570 mL] 570 mL PEEP:  [5 cmH20] 5 cmH20 Plateau Pressure:  [23 cmH20-26 cmH20] 24 cmH20  Labs:  Recent Labs  11/20/15 0433  CREATININE 6.46*   Estimated Creatinine Clearance: 16.5 mL/min (by C-G formula based on Cr of 6.46).   Recent Labs   0357  0739  1154  GLUCAP 102* 113* 118*    Microbiology: Recent Results (from the past 720 hour(s))  Blood culture (routine x 2)     Status: None   Collection Time: 2015-12-04  6:01 PM  Result Value Ref Range Status   Specimen Description BLOOD LEFT HAND  Final   Special Requests BOTTLES DRAWN AEROBIC AND ANAEROBIC 4CC  Final   Culture  Setup Time   Final    GRAM POSITIVE COCCI IN PAIRS IN BOTH AEROBIC AND ANAEROBIC BOTTLES CRITICAL VALUE NOTED.  VALUE IS CONSISTENT WITH PREVIOUSLY REPORTED AND CALLED VALUE.    Culture   Final    STREPTOCOCCUS PNEUMONIAE IN BOTH AEROBIC AND ANAEROBIC  BOTTLES    Report Status 11/15/2015 FINAL  Final   Organism ID, Bacteria STREPTOCOCCUS PNEUMONIAE  Final      Susceptibility   Streptococcus pneumoniae - MIC*    ERYTHROMYCIN <=0.12 SENSITIVE Sensitive     VANCOMYCIN 0.5 SENSITIVE Sensitive     TRIMETH/SULFA <=10 SENSITIVE Sensitive     CLINDAMYCIN <=0.25 SENSITIVE Sensitive     CEFTRIAXONE Value in next row Sensitive      SENSITIVE<=0.12    PENICILLIN Value in next row Sensitive      SENSITIVE<=0.06    * STREPTOCOCCUS PNEUMONIAE  Blood culture (routine x 2)     Status: None   Collection Time: December 04, 2015  6:07 PM  Result Value Ref Range Status   Specimen Description BLOOD LEFT ASSIST CONTROL  Final   Special Requests BOTTLES DRAWN AEROBIC AND ANAEROBIC 7CC  Final   Culture  Setup Time   Final    GRAM POSITIVE COCCI IN PAIRS IN BOTH AEROBIC AND ANAEROBIC BOTTLES CRITICAL RESULT CALLED TO, READ BACK BY AND VERIFIED WITH: HANK ZOMPA AT 1220 ON 11/23/15 CTJ    Culture   Final    STREPTOCOCCUS PNEUMONIAE IN BOTH AEROBIC AND ANAEROBIC BOTTLES    Report Status 11/15/2015 FINAL  Final   Organism ID, Bacteria STREPTOCOCCUS PNEUMONIAE  Final      Susceptibility   Streptococcus pneumoniae - MIC*    ERYTHROMYCIN <=0.12 SENSITIVE Sensitive  VANCOMYCIN 0.5 SENSITIVE Sensitive     TRIMETH/SULFA <=10 SENSITIVE Sensitive     CLINDAMYCIN <=0.25 SENSITIVE Sensitive     CEFTRIAXONE Value in next row Sensitive      SENSITIVE<=0.12    PENICILLIN Value in next row Sensitive      SENSITIVE<=0.06    * STREPTOCOCCUS PNEUMONIAE  Blood Culture ID Panel (Reflexed)     Status: Abnormal   Collection Time: Nov 27, 2015  6:07 PM  Result Value Ref Range Status   Enterococcus species NOT DETECTED NOT DETECTED Final   Listeria monocytogenes NOT DETECTED NOT DETECTED Final   Staphylococcus species NOT DETECTED NOT DETECTED Final   Staphylococcus aureus NOT DETECTED NOT DETECTED Final   Streptococcus species DETECTED (A) NOT DETECTED Corrected    Comment:  CRITICAL RESULT CALLED TO, READ BACK BY AND VERIFIED WITH: HANK ZOMPA AT 1220 ON 11/13/15 CTJ CORRECTED ON 01/06 AT 4098: PREVIOUSLY REPORTED AS DETECTED    Streptococcus agalactiae NOT DETECTED NOT DETECTED Final   Streptococcus pneumoniae DETECTED (A) NOT DETECTED Final    Comment: CRITICAL RESULT CALLED TO, READ BACK BY AND VERIFIED WITH: HANK ZOMPA AT 1220 ON 11/13/15 CTJ    Streptococcus pyogenes NOT DETECTED NOT DETECTED Final   Acinetobacter baumannii NOT DETECTED NOT DETECTED Final   Enterobacteriaceae species NOT DETECTED NOT DETECTED Final   Enterobacter cloacae complex NOT DETECTED NOT DETECTED Final   Escherichia coli NOT DETECTED NOT DETECTED Final   Klebsiella oxytoca NOT DETECTED NOT DETECTED Final   Klebsiella pneumoniae NOT DETECTED NOT DETECTED Final   Proteus species NOT DETECTED NOT DETECTED Final   Serratia marcescens NOT DETECTED NOT DETECTED Final   Haemophilus influenzae NOT DETECTED NOT DETECTED Final   Neisseria meningitidis NOT DETECTED NOT DETECTED Final   Pseudomonas aeruginosa NOT DETECTED NOT DETECTED Final   Candida albicans NOT DETECTED NOT DETECTED Final   Candida glabrata NOT DETECTED NOT DETECTED Final   Candida krusei NOT DETECTED NOT DETECTED Final   Candida parapsilosis NOT DETECTED NOT DETECTED Final   Candida tropicalis NOT DETECTED NOT DETECTED Final   Carbapenem resistance NOT DETECTED NOT DETECTED Final   Methicillin resistance NOT DETECTED NOT DETECTED Final   Vancomycin resistance NOT DETECTED NOT DETECTED Final  MRSA PCR Screening     Status: None   Collection Time: November 27, 2015 10:01 PM  Result Value Ref Range Status   MRSA by PCR NEGATIVE NEGATIVE Final    Comment:        The GeneXpert MRSA Assay (FDA approved for NASAL specimens only), is one component of a comprehensive MRSA colonization surveillance program. It is not intended to diagnose MRSA infection nor to guide or monitor treatment for MRSA infections.   Culture,  expectorated sputum-assessment     Status: None   Collection Time: 11/13/15  2:56 AM  Result Value Ref Range Status   Specimen Description SPUTUM  Final   Special Requests Normal  Final   Sputum evaluation   Final    Sputum specimen not acceptable for testing.  Please recollect.   CALLED BETH BUONO AT 1191 11/13/15.PMH   Report Status 11/13/2015 FINAL  Final  Culture, expectorated sputum-assessment     Status: None   Collection Time: 11/17/15  9:57 AM  Result Value Ref Range Status   Specimen Description SPUTUM  Final   Special Requests Normal  Final   Sputum evaluation THIS SPECIMEN IS ACCEPTABLE FOR SPUTUM CULTURE  Final   Report Status 11/18/2015 FINAL  Final  Culture, respiratory (NON-Expectorated)  Status: None   Collection Time: 11/17/15  9:57 AM  Result Value Ref Range Status   Specimen Description SPUTUM  Final   Special Requests Normal Reflexed from [id number]  Final   Gram Stain   Final    FAIR SPECIMEN - 70-80% WBCS FEW WBC SEEN FEW YEAST    Culture MODERATE GROWTH CANDIDA DUBLINIENSIS  Final   Report Status  FINAL  Final  CULTURE, BLOOD (ROUTINE X 2) w Reflex to PCR ID Panel     Status: None (Preliminary result)   Collection Time: 11/18/15 10:34 PM  Result Value Ref Range Status   Specimen Description BLOOD LEFT HAND  Final   Special Requests   Final    BOTTLES DRAWN AEROBIC AND ANAEROBIC 5MLANAEROBIC, 15MLAEROBIC   Culture NO GROWTH 3 DAYS  Final   Report Status PENDING  Incomplete  CULTURE, BLOOD (ROUTINE X 2) w Reflex to PCR ID Panel     Status: None (Preliminary result)   Collection Time: 11/18/15 10:34 PM  Result Value Ref Range Status   Specimen Description BLOOD LEFT ANTECUBITAL  Final   Special Requests BOTTLES DRAWN AEROBIC AND ANAEROBIC 10ML  Final   Culture NO GROWTH 2 DAYS  Final   Report Status PENDING  Incomplete    Medications:  Scheduled:  . antiseptic oral rinse  7 mL Mouth Rinse q12n4p  . aspirin  81 mg Oral Daily  .  ceFAZolin  (ANCEF) IV  2 g Intravenous Q12H  . chlorhexidine  15 mL Mouth Rinse BID  . free water  30 mL Per Tube 6 times per day  . insulin aspart  0-9 Units Subcutaneous 6 times per day  . ipratropium-albuterol  3 mL Nebulization Q4H  . pantoprazole (PROTONIX) IV  40 mg Intravenous Q12H  . sodium chloride  10-40 mL Intracatheter Q12H  . sodium chloride  3 mL Intravenous Q12H  . sodium chloride  3 mL Intravenous Q12H   Infusions:  . feeding supplement (VITAL AF 1.2 CAL) Stopped (11/20/15 1130)  . fentaNYL infusion INTRAVENOUS 50 mcg/hr ( 1300)  .  sodium bicarbonate 150 mEq in sterile water 1000 mL infusion 50 mL/hr at  1300    Assessment: Pharmacy consulted for medication management for 60 yo male ICU patient. Patient is currently on day 5 (day 9 of antibiotics) of cefazolin 2g IV Q12hr for pan-sensitive strep pneumonia.   Plan:  1. Cefazolin: Will continue patient on cefazolin 2g IV Q12hr.   Pharmacy will continue to monitor and adjust per consult.    Simpson,Maximilien L ,2:28 PM

## 2015-11-21 NOTE — Progress Notes (Signed)
RT to room to extubate patient per Dr. Dema SeverinMungal without reintubation.  Patient suctioned prior to extubation and extubated to 4lpm  without complication.  Vent removed from room, will continue to monitor.

## 2015-11-21 NOTE — Progress Notes (Signed)
Brief Nutrition Noted:   Pt remains on vent but plan for one way extubation and anticipation of comfort care measures post extubation today. TF on hold and orders to be discontinued. Pt NPO. No further nutrition interventions warranted at this time. Will sign off. Please re-consult RD if nutrition intervention is warranted  Romelle StarcherCate Jeziah Kretschmer MS, RD, LDN [phone] Pager  [phone] Weekend/On-Call Pager

## 2015-11-21 NOTE — Progress Notes (Signed)
Nurse called for exam.  Pt has fixed pupils, no reaction to light. Pulse not palpable. No audible heart sound  Pt is declared dead.  Time of death 20:14

## 2015-11-21 NOTE — Progress Notes (Signed)
Update: Spoke with wife and patient.  They have both agreed to one-way extubation, no retintubation.  If respiratory distress/failure ensues after extubation, then proceed with comfort care measure.   Orders for extubation placed.   Stephanie AcreVishal Natisha Trzcinski, MD [facility] Pager [phone]- [phone] (please enter 7-digits) On Call Pager - [phone] (please enter 7-digits)

## 2015-11-23 LAB — CULTURE, BLOOD (ROUTINE X 2): Culture: NO GROWTH

## 2015-11-24 ENCOUNTER — Encounter: Payer: Self-pay | Admitting: *Deleted

## 2015-11-24 ENCOUNTER — Other Ambulatory Visit: Payer: Self-pay | Admitting: *Deleted

## 2015-11-24 ENCOUNTER — Telehealth: Payer: Self-pay | Admitting: Family Medicine

## 2015-11-24 LAB — CULTURE, BLOOD (ROUTINE X 2): CULTURE: NO GROWTH

## 2015-11-24 NOTE — Patient Outreach (Addendum)
[facility] Care Management  11/24/2015  Carl Perry [birth year] [id number]   Subjective: Received voice mail message from patient's wife Carl Perry, states patient died on  at 8:15pm.   Telephone call to patient's home number, spoke with wife Carl Perry, and HIPAA verified.  Wife states patient had been in the hospital with double pneumonia, myocardial infarction, and was on a ventilator until shortly before his death.    Wife states she has tried to notify all of patient's provider of his death.   States she will make the final arrangements with [employer] at 1:00pm today.  Wife states everything has been taken care of, she has very supportive co-workers and has been advised to take the time she needs off from work.  RNCM advised wife of RNCM's condolences, appreciation for being a part of patient's health care team, notification of patient's death,  and to notify RNCM if any assistance needed.   Plan:  RNCM will notify Carl Perry, Carl Perry and Carl Perry with [facility] Care Management,  to close patient's case due to patient being deceased. RNCM will notify patient's primary MD Dr. Carlynn PurlSowles of patient's case closure due to patient being deceased.     Charika Mikelson H. Gardiner Barefootooper RN, BSN, CCM [facility] Telephonic CM Phone: [phone] Fax: [phone]

## 2015-11-24 NOTE — Telephone Encounter (Signed)
ERRENOUS °

## 2015-12-10 NOTE — Discharge Summary (Signed)
[facility] - [place] [facility]   PATIENT NAME: Carl Perry    MR#:  [id number]  DATE OF BIRTH:  October 29, 1956  DATE OF ADMISSION:  November 25, 2015 ADMITTING PHYSICIAN: Katharina Caper, MD  DATE OF DISCHARGE: 11/17/2015  4:43 AM  PRIMARY CARE PHYSICIAN: Ruel Favors, MD     ADMISSION DIAGNOSIS:  SOB (shortness of breath) [R06.02] Community acquired pneumonia [J18.9] Acute on chronic congestive heart failure, unspecified congestive heart failure type (HCC) [I50.9]  DISCHARGE DIAGNOSIS:  Active Problems:   Right lower lobe pneumonia   Acute pulmonary edema (HCC)   Community acquired pneumonia   Respiratory failure (HCC)   NSTEMI (non-ST elevated myocardial infarction) (HCC)   Acute on chronic congestive heart failure (HCC)   Pressure ulcer   SECONDARY DIAGNOSIS:   Past Medical History  Diagnosis Date  . COPD (chronic obstructive pulmonary disease) (HCC)     a. on intermittent home O2  . Diabetes mellitus (HCC)   . Hypertension   . CKD (chronic kidney disease), stage IV (HCC)   . Sleep apnea   . Rhabdomyolysis   . Hyperlipidemia   . Morbid obesity (HCC)   . Depression   . Chronic diastolic CHF (congestive heart failure) (HCC)     a. echo 11/2013: EF 60-65%, mild LVH, mildly increased LV internal cavity size, moderately increased LV posterior wall thickness; b. echo 1/17: EF 45-50%, severe HK of apical, lateral, & anterolateral myocardium, technically difficult images other regions of WMA cannot be excluded, GR1DD, mild MR, LA mild to mod dilated, PASP nl  . Allergy   . Chronic liver failure (HCC)   . Anemia of chronic disease   . Polysubstance abuse     a. prior abuse of benzos and acid  . PNA (pneumonia)     a. recurrent PNA 10/2015 and 11/2015 leading to acute respiratory failure    .pro HOSPITAL COURSE:   Carl Perry is a 60 y.o. male with a known history of diabetes with neuropathy, COPD, stage IV, peripheral vascular disease, liver  failure, secondary hyperparathyroidism, hyperlipidemia, COPD who presents to the hospital with complaints of worsening shortness of breath over the past 3 or 4 days, some wheezing, minimal cough and sputum production. On arrival to emergency room, patient was noted to have tachycardia and tachypnea and was hypoxic to 86% on BiPAP. He initially refused intubation, despite his ABGs showing a pH of 7.25. He improved, however, on the BiPAP. His labs revealed renal failure, chronic and elevated troponin to 1.01. Patient's lactic acid level was found to be elevated at 2.9 and his white blood cell count was 26.1. Chest x-ray revealed worsening right-sided pneumonia. Hospitalist services were contacted for admission. Due to worsening shortness of breath, renal failure, metabolic and respiratory acidosis he was eventually intubated.  Blood cultures grew streptococcus pneumoniae. Despite patient being treated with adequate antibiotics , his condition worsened and family decided to terminally extubate him January 13th, 2017, shortly after extubation patient expired. Discussion by problem: . #1 acute respiratory failure with hypoxia and hypercapnia due to underlying RLL pneumonia. -patient was treated with IV ceftriaxone, sputum cultures were unremarkable. However blood cultures grew Streptococcus pneumonia. Mechanical ventilation was continued until family decided to terminally extubate patient  #2 RLL pneumonia-suspected underlying streptococcal pneumonia, although sputum culture done on 11/13/2015 revealed moderate growth of candida. Patient's blood cultures are positive for Streptococcus. The patient was on  IV ceftriaxone until comfort measures were initiated. .  #3 non-ST elevation MI-patient has ruled  in by cardiac markers. He remained clinically asymptomatic . Medical therapy was recommended by cardiologist due to renal failure. The patient was managed on ASA, heparin subcutaneously, however, developed  gastrointestinal bleed and these medications were discontinued.  Patient was not on beta blockers , nitroglycerin due to hypotension /sepstic shock  #4 sepsis due to Streptococcus pneumonia secondary to pneumonia. Was given IV ceftriaxone. Clinically worsened, terminally extubated per family wishes.   #5 acute on chronic renal failure due to ATN, became oliguric-he had baseline CK disease stage IV. He did not want to be on  dialysis, even temporarily, he expressed this very clearly in front of family and physicians. - Baseline Cr. 2.6 in 12/16 and yesterday 6.46 January 11th. Likely Cardiorenal in nature given his NSTEMI. Urine output was low, about 600 over  24 hours  -Nephrology was consulted , appreciate input, help.   #6 hypokalemia-resolved  #7 depression - patient was continued on Elavil, Effexor while in hospital.   #8 diabetes type 2 without complications -continued on  sliding scale insulin.  #9 diabetic neuropathy- discontinued Neurontin due to renal failure.  #10. Upper Gastrointestinal bleed, of aspirin as well as heparin, given  Protonix intravenously twice a day, hemoglobin level was declining, but no transfusion was planned due to palliation/hospice plans   DISCHARGE CONDITIONS:   died  CONSULTS OBTAINED:  Treatment Team:  Antonieta Iba, MD Lamont Dowdy, MD Merwyn Katos, MD Hildred Laser, MD  DRUG ALLERGIES:   Allergies  Allergen Reactions  . Statins Swelling and Other (See Comments)    Reaction:  Rhabdomyolysis  . Ivp Dye [Iodinated Diagnostic Agents] Other (See Comments)    Reaction:  Unknown   . Penicillins Rash and Other (See Comments)    Unable to obtain enough information to answer additional questions about this medication.      DISCHARGE MEDICATIONS:   Discharge Medication List as of   4:43 AM    CONTINUE these medications which have NOT CHANGED   Details  acetaminophen-codeine (TYLENOL #3) 300-30 MG tablet Take 1  tablet by mouth every 8 (eight) hours as needed for moderate pain., Starting 10/14/2015, Until Discontinued, Print    amitriptyline (ELAVIL) 25 MG tablet Take 50 mg by mouth at bedtime. , Until Discontinued, Historical Med    aspirin EC 81 MG tablet Take 81 mg by mouth daily., Until Discontinued, Historical Med    baclofen (LIORESAL) 20 MG tablet Take 20 mg by mouth at bedtime as needed for muscle spasms., Until Discontinued, Historical Med    docusate sodium (COLACE) 100 MG capsule Take 100 mg by mouth 2 (two) times daily as needed for mild constipation., Until Discontinued, Historical Med    gabapentin (NEURONTIN) 300 MG capsule Take 600 mg by mouth at bedtime., Until Discontinued, Historical Med    gemfibrozil (LOPID) 600 MG tablet Take 600 mg by mouth 2 (two) times daily. , Until Discontinued, Historical Med    glipiZIDE (GLUCOTROL) 10 MG tablet Take 10 mg by mouth daily before breakfast. , Until Discontinued, Historical Med    insulin aspart (NOVOLOG) 100 UNIT/ML injection Inject into the skin 3 (three) times daily with meals as needed for high blood sugar. Pt uses as needed per sliding scale., Until Discontinued, Historical Med    insulin detemir (LEVEMIR) 100 UNIT/ML injection Inject 45 Units into the skin at bedtime., Until Discontinued, Historical Med    lactulose (CHRONULAC) 10 GM/15ML solution Take 15 mLs by mouth 2 (two) times daily. , Until Discontinued, Historical  Med    Liraglutide 18 MG/3ML SOPN Inject 1.8 mg into the skin daily. , Until Discontinued, Historical Med    metoCLOPramide (REGLAN) 5 MG tablet Take 1 tablet (5 mg total) by mouth daily as needed for nausea., Starting 10/30/2015, Until Discontinued, Normal    metolazone (ZAROXOLYN) 5 MG tablet Take 5 mg by mouth daily as needed (for edema). , Until Discontinued, Historical Med    metoprolol tartrate (LOPRESSOR) 25 MG tablet Take 0.5 tablets (12.5 mg total) by mouth 2 (two) times daily., Starting 10/30/2015, Until  Discontinued, Normal    potassium chloride SA (K-DUR,KLOR-CON) 20 MEQ tablet Take 20 mEq by mouth 2 (two) times daily., Until Discontinued, Historical Med    torsemide (DEMADEX) 100 MG tablet Take 1 tablet (100 mg total) by mouth 2 (two) times daily., Starting 08/14/2015, Until Discontinued, Normal    venlafaxine XR (EFFEXOR-XR) 75 MG 24 hr capsule Take 3 capsules (225 mg total) by mouth daily., Starting 10/14/2015, Until Discontinued, Normal         DISCHARGE INSTRUCTIONS:    No follow up  If you experience worsening of your admission symptoms, develop shortness of breath, life threatening emergency, suicidal or homicidal thoughts you must seek medical attention immediately by calling 911 or calling your MD immediately  if symptoms less severe.  You Must read complete instructions/literature along with all the possible adverse reactions/side effects for all the Medicines you take and that have been prescribed to you. Take any new Medicines after you have completely understood and accept all the possible adverse reactions/side effects.   Please note  You were cared for by a hospitalist during your hospital stay. If you have any questions about your discharge medications or the care you received while you were in the hospital after you are discharged, you can call the unit and asked to speak with the hospitalist on call if the hospitalist that took care of you is not available. Once you are discharged, your primary care physician will handle any further medical issues. Please note that NO REFILLS for any discharge medications will be authorized once you are discharged, as it is imperative that you return to your primary care physician (or establish a relationship with a primary care physician if you do not have one) for your aftercare needs so that they can reassess your need for medications and monitor your lab values.    Today   CHIEF COMPLAINT:   Chief Complaint  Patient presents with   . Respiratory Distress    HISTORY OF PRESENT ILLNESS:  Carl Perry  is a 60 y.o. male with a known history of diabetes with neuropathy, COPD, stage IV, peripheral vascular disease, liver failure, secondary hyperparathyroidism, hyperlipidemia, COPD who presents to the hospital with complaints of worsening shortness of breath over the past 3 or 4 days, some wheezing, minimal cough and sputum production. On arrival to emergency room, patient was noted to have tachycardia and tachypnea and was hypoxic to 86% on BiPAP. He initially refused intubation, despite his ABGs showing a pH of 7.25. He improved, however, on the BiPAP. His labs revealed renal failure, chronic and elevated troponin to 1.01. Patient's lactic acid level was found to be elevated at 2.9 and his white blood cell count was 26.1. Chest x-ray revealed worsening right-sided pneumonia. Hospitalist services were contacted for admission. Due to worsening shortness of breath, renal failure, metabolic and respiratory acidosis he was eventually intubated.  Blood cultures grew streptococcus pneumoniae. Despite patient being treated with adequate antibiotics ,  his condition worsened and family decided to terminally extubate him January 13th, 2017, shortly after extubation patient expired. Discussion by problem: . #1 acute respiratory failure with hypoxia and hypercapnia due to underlying RLL pneumonia. -patient was treated with IV ceftriaxone, sputum cultures were unremarkable. However blood cultures grew Streptococcus pneumonia. Mechanical ventilation was continued until family decided to terminally extubate patient  #2 RLL pneumonia-suspected underlying streptococcal pneumonia, although sputum culture done on 11/13/2015 revealed moderate growth of candida. Patient's blood cultures are positive for Streptococcus. The patient was on  IV ceftriaxone until comfort measures were initiated. .  #3 non-ST elevation MI-patient has ruled in by cardiac  markers. He remained clinically asymptomatic . Medical therapy was recommended by cardiologist due to renal failure. The patient was managed on ASA, heparin subcutaneously, however, developed gastrointestinal bleed and these medications were discontinued.  Patient was not on beta blockers , nitroglycerin due to hypotension /sepstic shock  #4 sepsis due to Streptococcus pneumonia secondary to pneumonia. Was given IV ceftriaxone. Clinically worsened, terminally extubated per family wishes.   #5 acute on chronic renal failure due to ATN, became oliguric-he had baseline CK disease stage IV. He did not want to be on  dialysis, even temporarily, he expressed this very clearly in front of family and physicians. - Baseline Cr. 2.6 in 12/16 and yesterday 6.46 January 11th. Likely Cardiorenal in nature given his NSTEMI. Urine output was low, about 600 over  24 hours  -Nephrology was consulted , appreciate input, help.   #6 hypokalemia-resolved  #7 depression - patient was continued on Elavil, Effexor while in hospital.   #8 diabetes type 2 without complications -continued on  sliding scale insulin.  #9 diabetic neuropathy- discontinued Neurontin due to renal failure.  #10. Upper Gastrointestinal bleed, of aspirin as well as heparin, given  Protonix intravenously twice a day, hemoglobin level was declining, but no transfusion was planned due to palliation/hospice plans   VITAL SIGNS:  Blood pressure 95/61, pulse 96, temperature 97.5 F (36.4 C), temperature source Oral, resp. rate 30, height  (1.753 m), weight 131.5 kg (289 lb 14.5 oz), SpO2 93 %.  I/O:  No intake or output data in the 24 hours ending 12/07/15 1532  PHYSICAL EXAMINATION:  GENERAL:  60 y.o.-year-old patient lying in the bed with no acute distress.  EYES: Pupils equal, round, reactive to light and accommodation. No scleral icterus. Extraocular muscles intact.  HEENT: Head atraumatic, normocephalic. Oropharynx and  nasopharynx clear.  NECK:  Supple, no jugular venous distention. No thyroid enlargement, no tenderness.  LUNGS: Normal breath sounds bilaterally, no wheezing, rales,rhonchi or crepitation. No use of accessory muscles of respiration.  CARDIOVASCULAR: S1, S2 normal. No murmurs, rubs, or gallops.  ABDOMEN: Soft, non-tender, non-distended. Bowel sounds present. No organomegaly or mass.  EXTREMITIES: No pedal edema, cyanosis, or clubbing.  NEUROLOGIC: Cranial nerves II through XII are intact. Muscle strength 5/5 in all extremities. Sensation intact. Gait not checked.  PSYCHIATRIC: The patient is alert and oriented x 3.  SKIN: No obvious rash, lesion, or ulcer.   DATA REVIEW:   CBC No results for input(s): WBC, HGB, HCT, PLT in the last 168 hours.  Chemistries  No results for input(s): NA, K, CL, CO2, GLUCOSE, BUN, CREATININE, CALCIUM, MG, AST, ALT, ALKPHOS, BILITOT in the last 168 hours.  Invalid input(s): GFRCGP  Cardiac Enzymes No results for input(s): TROPONINI in the last 168 hours.  Microbiology Results  Results for orders placed or performed during the hospital encounter of 11/11/2015  Blood culture (routine x 2)     Status: None   Collection Time: 11-23-15  6:01 PM  Result Value Ref Range Status   Specimen Description BLOOD LEFT HAND  Final   Special Requests BOTTLES DRAWN AEROBIC AND ANAEROBIC 4CC  Final   Culture  Setup Time   Final    GRAM POSITIVE COCCI IN PAIRS IN BOTH AEROBIC AND ANAEROBIC BOTTLES CRITICAL VALUE NOTED.  VALUE IS CONSISTENT WITH PREVIOUSLY REPORTED AND CALLED VALUE.    Culture   Final    STREPTOCOCCUS PNEUMONIAE IN BOTH AEROBIC AND ANAEROBIC BOTTLES    Report Status 11/15/2015 FINAL  Final   Organism ID, Bacteria STREPTOCOCCUS PNEUMONIAE  Final      Susceptibility   Streptococcus pneumoniae - MIC*    ERYTHROMYCIN <=0.12 SENSITIVE Sensitive     VANCOMYCIN 0.5 SENSITIVE Sensitive     TRIMETH/SULFA <=10 SENSITIVE Sensitive     CLINDAMYCIN <=0.25  SENSITIVE Sensitive     CEFTRIAXONE Value in next row Sensitive      SENSITIVE<=0.12    PENICILLIN Value in next row Sensitive      SENSITIVE<=0.06    * STREPTOCOCCUS PNEUMONIAE  Blood culture (routine x 2)     Status: None   Collection Time: 11-23-15  6:07 PM  Result Value Ref Range Status   Specimen Description BLOOD LEFT ASSIST CONTROL  Final   Special Requests BOTTLES DRAWN AEROBIC AND ANAEROBIC 7CC  Final   Culture  Setup Time   Final    GRAM POSITIVE COCCI IN PAIRS IN BOTH AEROBIC AND ANAEROBIC BOTTLES CRITICAL RESULT CALLED TO, READ BACK BY AND VERIFIED WITH: HANK ZOMPA AT 1220 ON 11/23/15 CTJ    Culture   Final    STREPTOCOCCUS PNEUMONIAE IN BOTH AEROBIC AND ANAEROBIC BOTTLES    Report Status 11/15/2015 FINAL  Final   Organism ID, Bacteria STREPTOCOCCUS PNEUMONIAE  Final      Susceptibility   Streptococcus pneumoniae - MIC*    ERYTHROMYCIN <=0.12 SENSITIVE Sensitive     VANCOMYCIN 0.5 SENSITIVE Sensitive     TRIMETH/SULFA <=10 SENSITIVE Sensitive     CLINDAMYCIN <=0.25 SENSITIVE Sensitive     CEFTRIAXONE Value in next row Sensitive      SENSITIVE<=0.12    PENICILLIN Value in next row Sensitive      SENSITIVE<=0.06    * STREPTOCOCCUS PNEUMONIAE  Blood Culture ID Panel (Reflexed)     Status: Abnormal   Collection Time: 11-23-2015  6:07 PM  Result Value Ref Range Status   Enterococcus species NOT DETECTED NOT DETECTED Final   Listeria monocytogenes NOT DETECTED NOT DETECTED Final   Staphylococcus species NOT DETECTED NOT DETECTED Final   Staphylococcus aureus NOT DETECTED NOT DETECTED Final   Streptococcus species DETECTED (A) NOT DETECTED Corrected    Comment: CRITICAL RESULT CALLED TO, READ BACK BY AND VERIFIED WITH: HANK ZOMPA AT 1220 ON 11/13/15 CTJ CORRECTED ON 01/06 AT 1610: PREVIOUSLY REPORTED AS DETECTED    Streptococcus agalactiae NOT DETECTED NOT DETECTED Final   Streptococcus pneumoniae DETECTED (A) NOT DETECTED Final    Comment: CRITICAL RESULT CALLED TO,  READ BACK BY AND VERIFIED WITH: HANK ZOMPA AT 1220 ON 11/13/15 CTJ    Streptococcus pyogenes NOT DETECTED NOT DETECTED Final   Acinetobacter baumannii NOT DETECTED NOT DETECTED Final   Enterobacteriaceae species NOT DETECTED NOT DETECTED Final   Enterobacter cloacae complex NOT DETECTED NOT DETECTED Final   Escherichia coli NOT DETECTED NOT DETECTED Final   Klebsiella oxytoca NOT DETECTED NOT DETECTED  Final   Klebsiella pneumoniae NOT DETECTED NOT DETECTED Final   Proteus species NOT DETECTED NOT DETECTED Final   Serratia marcescens NOT DETECTED NOT DETECTED Final   Haemophilus influenzae NOT DETECTED NOT DETECTED Final   Neisseria meningitidis NOT DETECTED NOT DETECTED Final   Pseudomonas aeruginosa NOT DETECTED NOT DETECTED Final   Candida albicans NOT DETECTED NOT DETECTED Final   Candida glabrata NOT DETECTED NOT DETECTED Final   Candida krusei NOT DETECTED NOT DETECTED Final   Candida parapsilosis NOT DETECTED NOT DETECTED Final   Candida tropicalis NOT DETECTED NOT DETECTED Final   Carbapenem resistance NOT DETECTED NOT DETECTED Final   Methicillin resistance NOT DETECTED NOT DETECTED Final   Vancomycin resistance NOT DETECTED NOT DETECTED Final  MRSA PCR Screening     Status: None   Collection Time: Nov 25, 2015 10:01 PM  Result Value Ref Range Status   MRSA by PCR NEGATIVE NEGATIVE Final    Comment:        The GeneXpert MRSA Assay (FDA approved for NASAL specimens only), is one component of a comprehensive MRSA colonization surveillance program. It is not intended to diagnose MRSA infection nor to guide or monitor treatment for MRSA infections.   Culture, expectorated sputum-assessment     Status: None   Collection Time: 11/13/15  2:56 AM  Result Value Ref Range Status   Specimen Description SPUTUM  Final   Special Requests Normal  Final   Sputum evaluation   Final    Sputum specimen not acceptable for testing.  Please recollect.   CALLED BETH BUONO AT 2841 11/13/15.PMH    Report Status 11/13/2015 FINAL  Final  Culture, expectorated sputum-assessment     Status: None   Collection Time: 11/17/15  9:57 AM  Result Value Ref Range Status   Specimen Description SPUTUM  Final   Special Requests Normal  Final   Sputum evaluation THIS SPECIMEN IS ACCEPTABLE FOR SPUTUM CULTURE  Final   Report Status 11/18/2015 FINAL  Final  Culture, respiratory (NON-Expectorated)     Status: None   Collection Time: 11/17/15  9:57 AM  Result Value Ref Range Status   Specimen Description SPUTUM  Final   Special Requests Normal Reflexed from [id number]  Final   Gram Stain   Final    FAIR SPECIMEN - 70-80% WBCS FEW WBC SEEN FEW YEAST    Culture MODERATE GROWTH CANDIDA DUBLINIENSIS  Final   Report Status  FINAL  Final  CULTURE, BLOOD (ROUTINE X 2) w Reflex to PCR ID Panel     Status: None   Collection Time: 11/18/15 10:34 PM  Result Value Ref Range Status   Specimen Description BLOOD LEFT HAND  Final   Special Requests   Final    BOTTLES DRAWN AEROBIC AND ANAEROBIC ,   Culture NO GROWTH 5 DAYS  Final   Report Status 11/23/2015 FINAL  Final  CULTURE, BLOOD (ROUTINE X 2) w Reflex to PCR ID Panel     Status: None   Collection Time: 11/18/15 10:34 PM  Result Value Ref Range Status   Specimen Description BLOOD LEFT ANTECUBITAL  Final   Special Requests BOTTLES DRAWN AEROBIC AND ANAEROBIC  Final   Culture NO GROWTH 5 DAYS  Final   Report Status 11/24/2015 FINAL  Final    RADIOLOGY:  No results found.  EKG:   Orders placed or performed during the hospital encounter of 2015/11/25  . EKG 12-Lead  . EKG 12-Lead  . ED EKG  . ED EKG  .  EKG 12-Lead  . EKG 12-Lead      Management plans discussed with the patient, family and they are in agreement.  CODE STATUS:  Code Status History    Date Active Date Inactive Code Status Order ID Comments User Context     7:24 PM 11/24/2015  7:44 AM DNR [id number]  Karl Ito, MD Inpatient    11/17/2015 12:24 PM   7:24 PM DNR [id number]  Erin Fulling, MD Inpatient   11/15/2015  3:57 PM 11/17/2015 12:24 PM Full Code [id number]  Storm Frisk, MD Inpatient   11/15/2015  2:26 PM 11/15/2015  3:57 PM DNR [id number]  Shane Crutch, MD Inpatient   12/06/2015 10:01 PM 11/15/2015  2:26 PM Full Code [id number]  Katharina Caper, MD Inpatient   03/08/2015 11:45 PM 03/09/2015  3:04 PM Full Code [id number]  Yehuda Savannah, RN Inpatient    Questions for Most Recent Historical Code Status (Order [id number])    Question Answer Comment   In the event of cardiac or respiratory ARREST Do not call a "code blue"    In the event of cardiac or respiratory ARREST Do not perform Intubation, CPR, defibrillation or ACLS    In the event of cardiac or respiratory ARREST Use medication by any route, position, wound care, and other measures to relive pain and suffering. May use oxygen, suction and manual treatment of airway obstruction as needed for comfort.     Advance Directive Documentation        Most Recent Value   Type of Advance Directive  Healthcare Power of Attorney, Living will   Pre-existing out of facility DNR order (yellow form or pink MOST form)     "MOST" Form in Place?        TOTAL TIME TAKING CARE OF THIS PATIENT: 50  minutes.  Discussed with family, emotional support provided  Reznor Ferrando M.D on 12/07/2015 at 3:32 PM  Between 7am to 6pm - Pager - [phone]  After 6pm go to [email or web address] - password [email or web address]  [facility]  Office  [phone]  CC: Primary care physician; Ruel Favors, MD

## 2015-12-10 DEATH — deceased

## 2015-12-15 ENCOUNTER — Encounter: Payer: Self-pay | Admitting: Family Medicine

## 2016-01-13 ENCOUNTER — Ambulatory Visit: Payer: Commercial Managed Care - HMO | Admitting: Family Medicine

## 2016-02-03 ENCOUNTER — Ambulatory Visit: Payer: Commercial Managed Care - HMO | Admitting: Family

## 2016-07-07 NOTE — Telephone Encounter (Signed)
This encounter was created in error - please disregard.

## 2016-08-25 IMAGING — CR DG CHEST 2V
1 series · 2 of 2 positions shown · non-contrast
Comparison: None.

CLINICAL DATA: Pneumonia.

EXAM:
CHEST  2 VIEW

[Series 1: left lateral · 0.14mm/px · 2 of 2 slices shown]
[im 1/2]
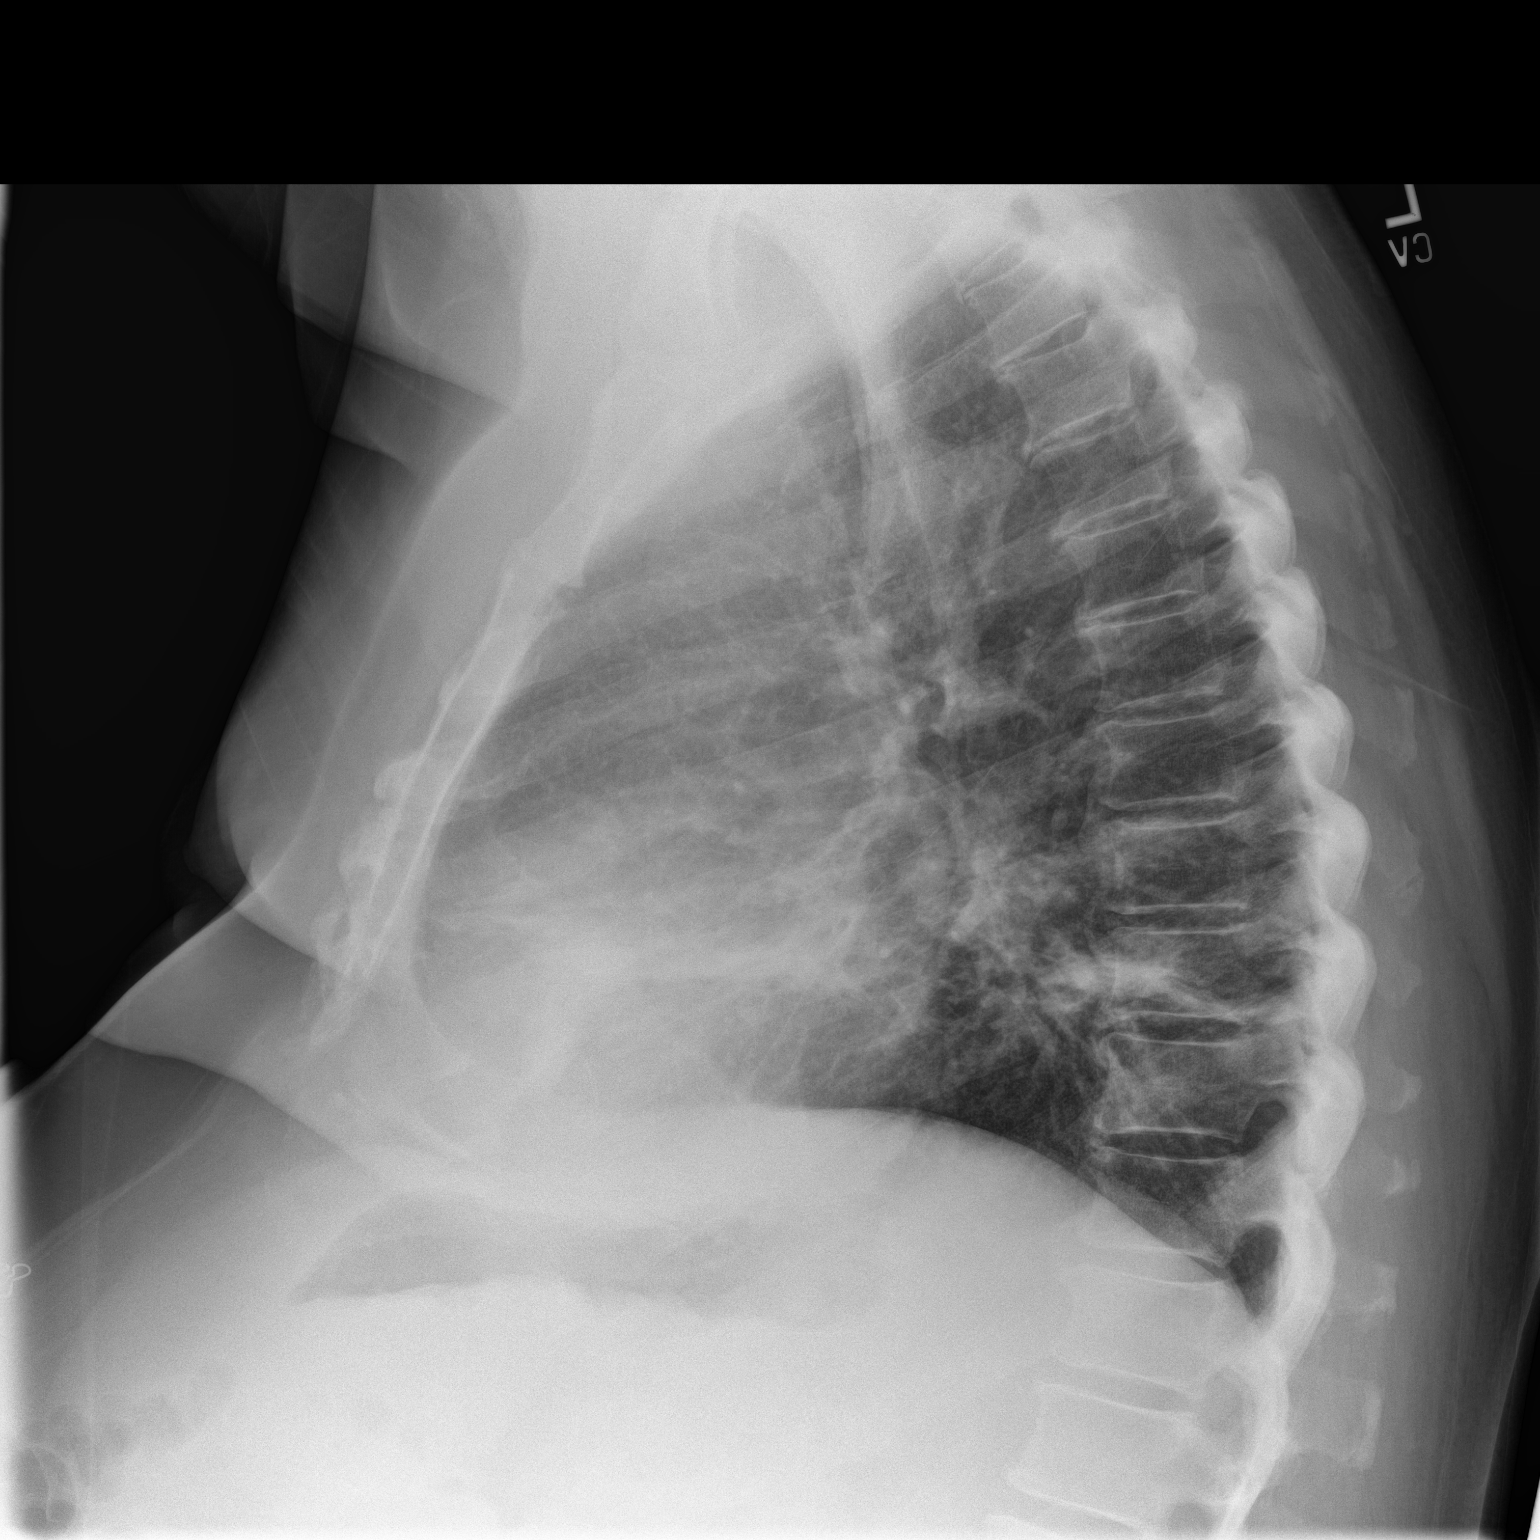
[im 2/2]
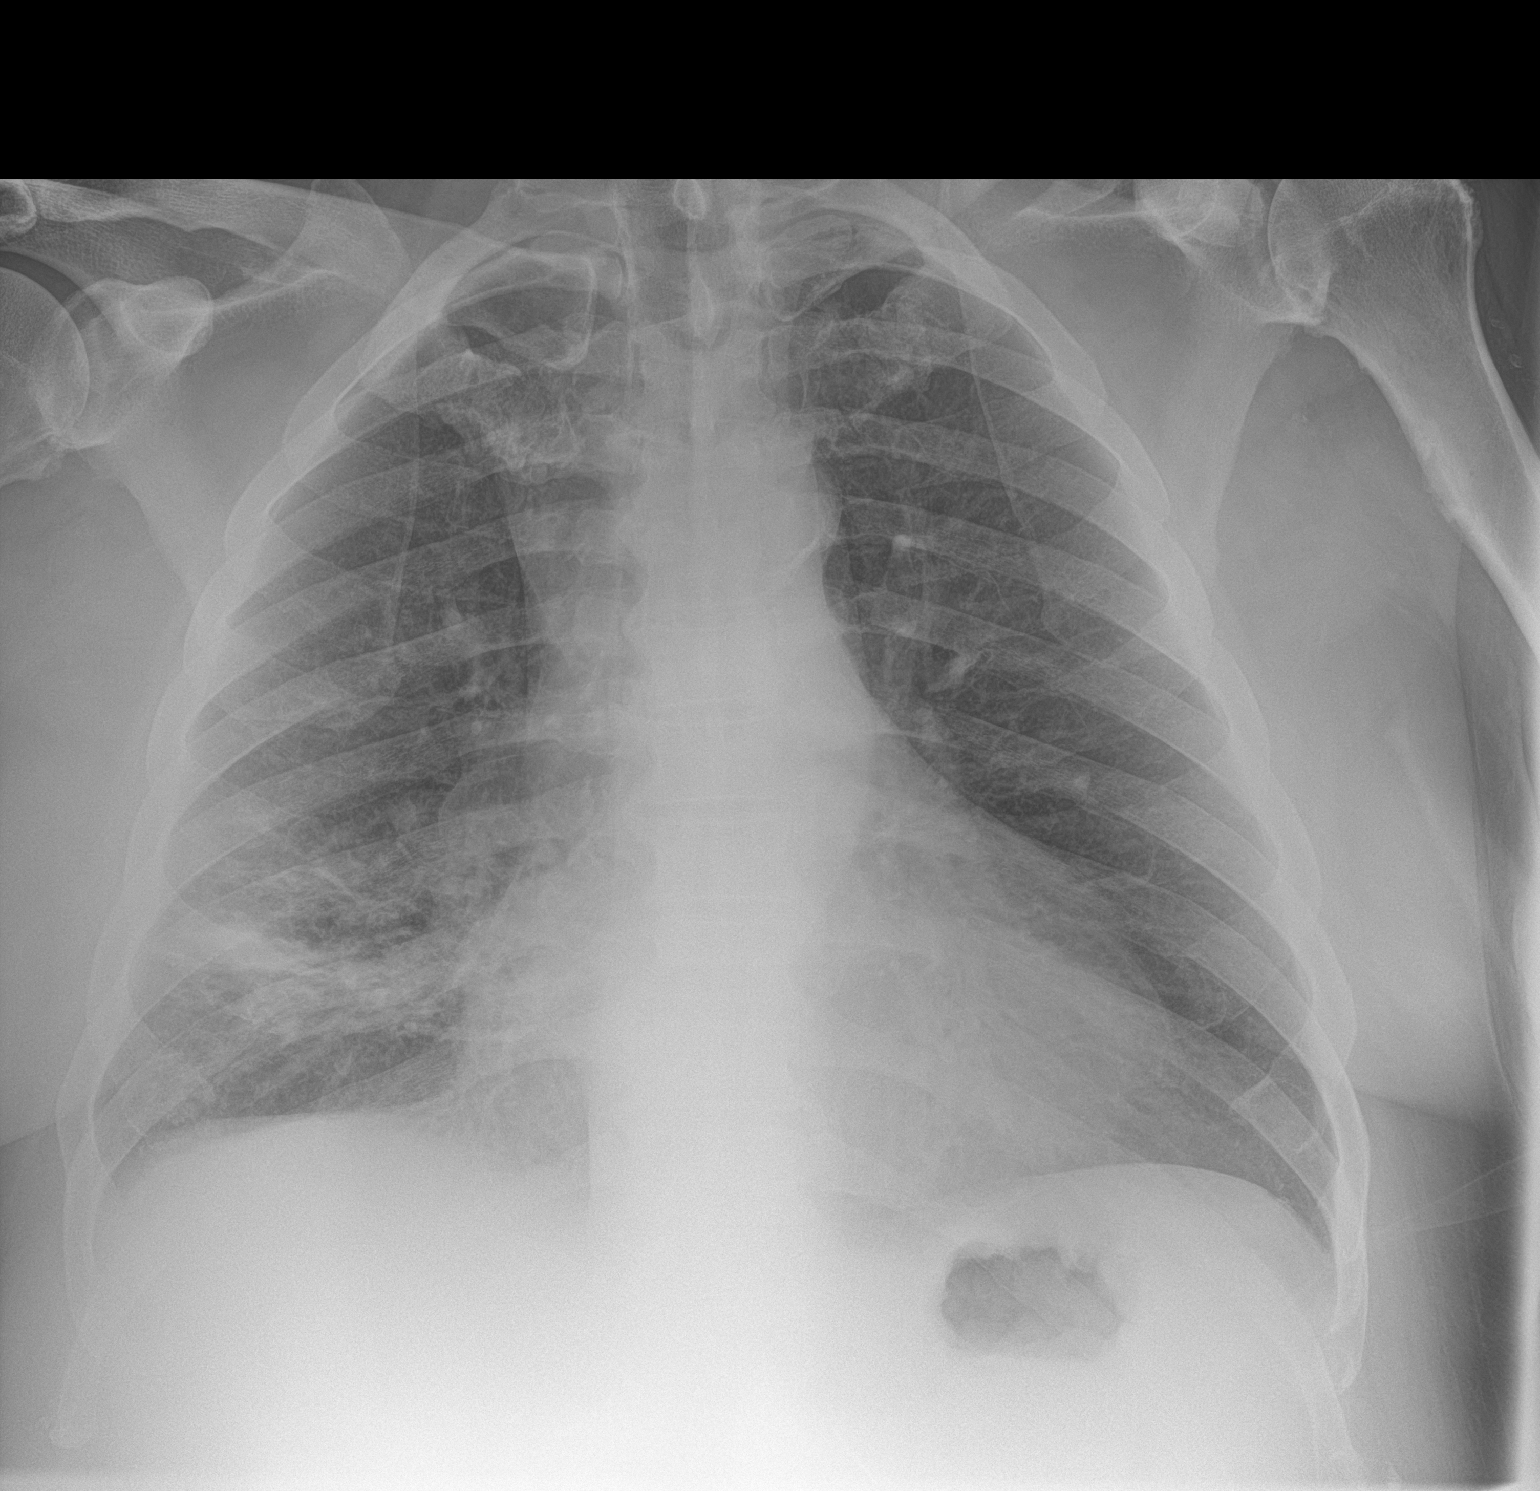

[2 of 2 positions shown; findings below may reference images not displayed]

FINDINGS: Mediastinum and hilar structures normal. Right lower lobe and middle
lobe atelectasis and infiltrate, improved from prior exam. No
pleural effusion or pneumothorax. Cardiomegaly with normal pulmonary
vascularity. No acute bony abnormality.
IMPRESSION: 1. Right middle lobe and right lower lobe atelectasis and infiltrate
improved from prior exam .

2. Cardiomegaly.

## 2016-09-05 IMAGING — CR DG CHEST 1V PORT
1 series · 1 of 1 positions shown · non-contrast
Comparison: Chest radiograph 11/14/2015.

CLINICAL DATA: Respiratory failure.

EXAM:
PORTABLE CHEST 1 VIEW

[portable]
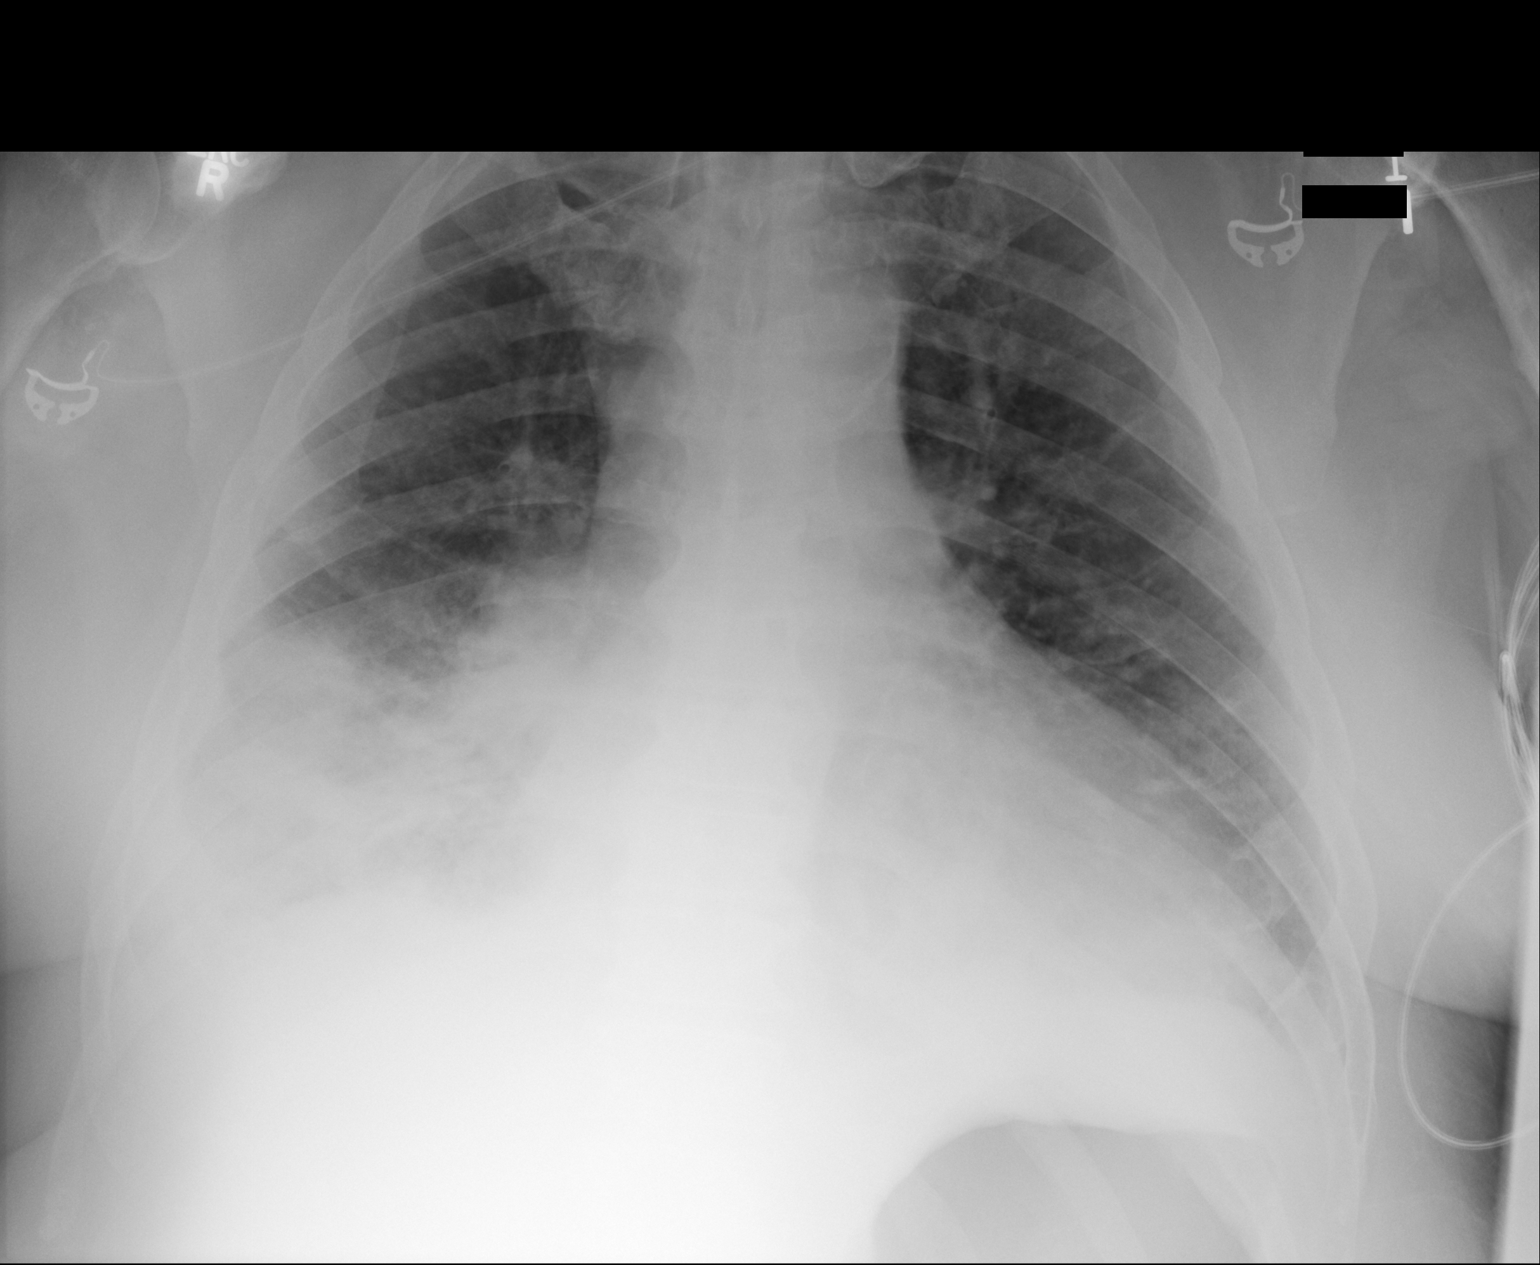

[1 of 1 positions shown; findings below may reference images not displayed]

FINDINGS: Stable enlarged cardiac mediastinal contours. Unchanged pulmonary
consolidation within the right lower lung. Minimal heterogeneous
opacities left lung base. Small right pleural effusion. No
pneumothorax.
IMPRESSION: Unchanged right-greater-than-left basilar airspace opacities which
may represent atelectasis or infection.

Small right pleural effusion.

## 2016-09-07 IMAGING — CR DG CHEST 1V
1 series · 1 of 1 positions shown · non-contrast
Comparison: 11/16/2015 and earlier.

CLINICAL DATA: 59-year-old male with shortness of breath on BiPAP.
Initial encounter.

EXAM:
CHEST 1 VIEW

[portable]
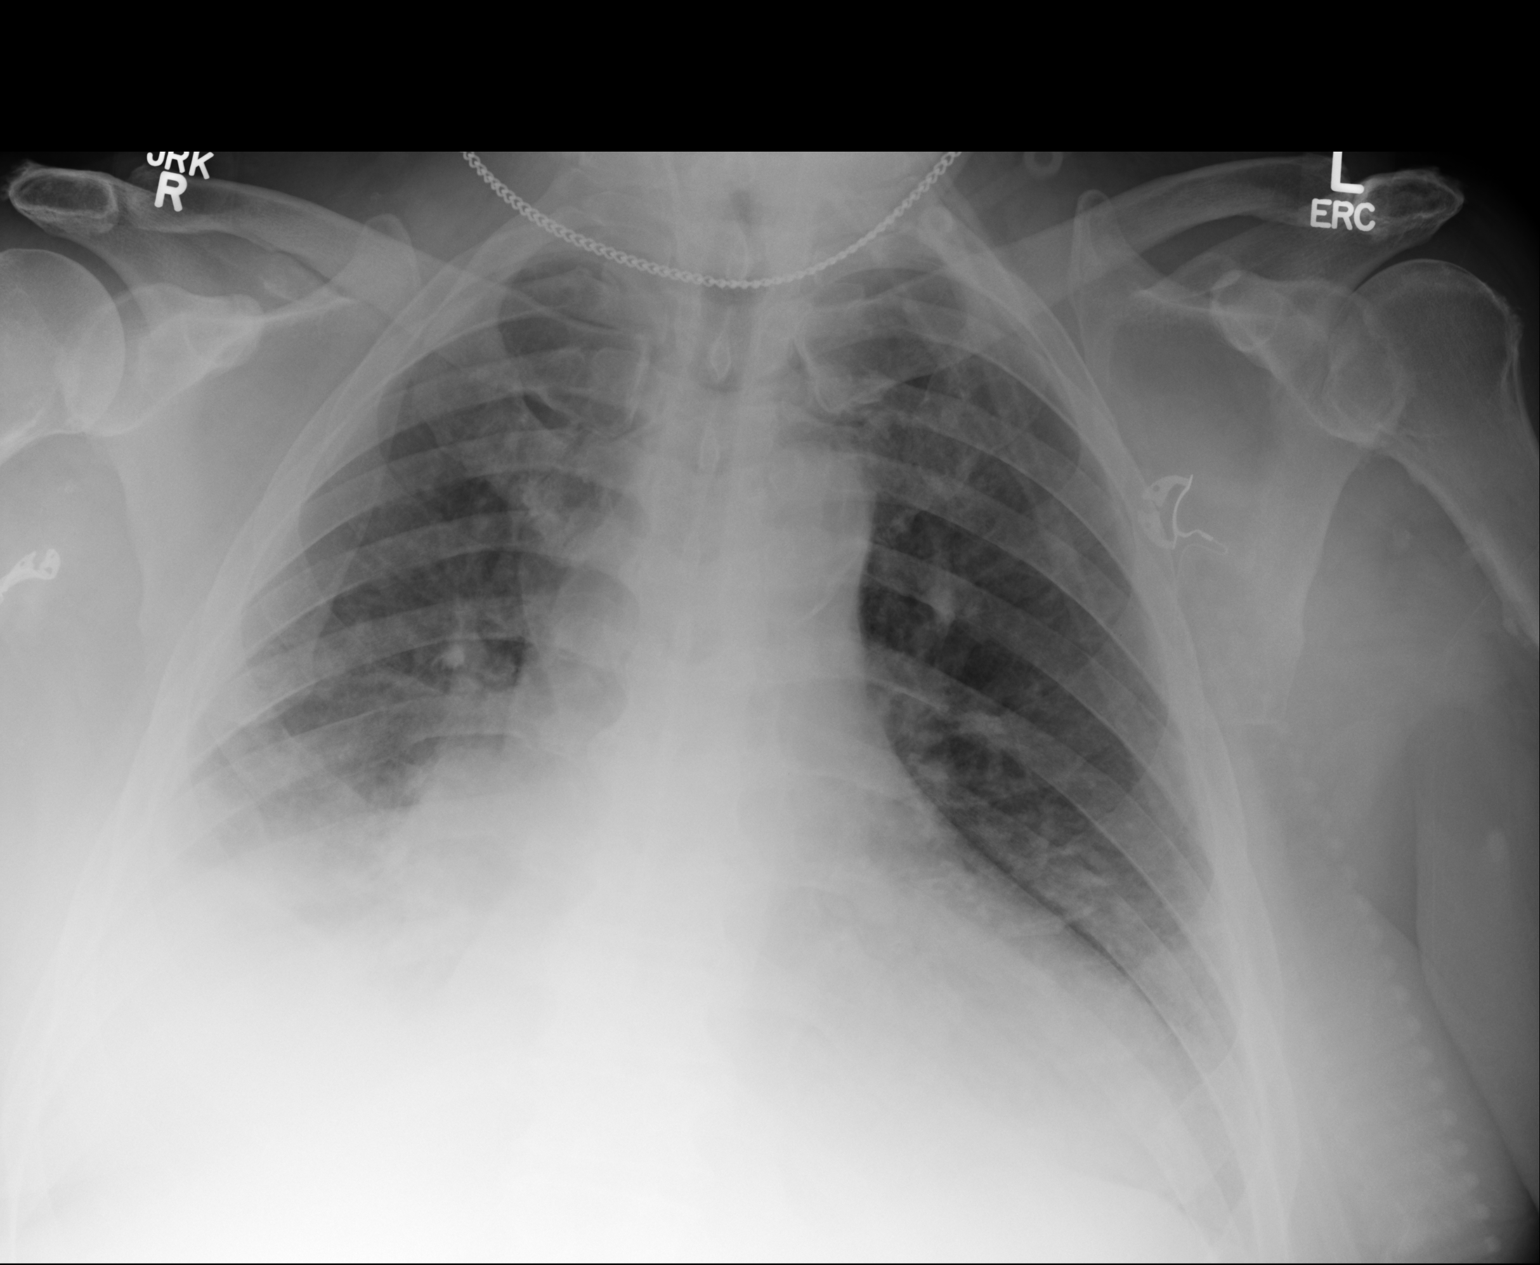

[1 of 1 positions shown; findings below may reference images not displayed]

FINDINGS: Portable AP upright view at 1551 hours. Since 11/04/2015, confluent
lung base opacity obscuring the hemidiaphragm. Interval mildly
improved ventilation at the left lung base with partial
visualization of the left hemidiaphragm. Stable cardiac size and
mediastinal contours. No pneumothorax. No pulmonary edema. No large
pleural effusion. Visualized tracheal air column is within normal
limits.
IMPRESSION: Confluent right worsened left lung base opacity. Favor pneumonia.
Mildly improved ventilation at the left base since yesterday.

## 2017-03-16 ENCOUNTER — Other Ambulatory Visit: Payer: Self-pay | Admitting: *Deleted

## 2019-03-09 NOTE — Telephone Encounter (Signed)
This encounter was created in error - please disregard.

## 2019-03-23 ENCOUNTER — Other Ambulatory Visit: Payer: Self-pay
